# Patient Record
Sex: Female | Born: 1955 | Race: White | Hispanic: No | Marital: Married | Smoking: Never smoker
Health system: Southern US, Community
[De-identification: ages and names within clinical notes are randomized; demographics above are authoritative.]

## PROBLEM LIST (undated history)

## (undated) DIAGNOSIS — K219 Gastro-esophageal reflux disease without esophagitis: Secondary | ICD-10-CM

## (undated) DIAGNOSIS — M545 Low back pain, unspecified: Secondary | ICD-10-CM

## (undated) DIAGNOSIS — G629 Polyneuropathy, unspecified: Secondary | ICD-10-CM

## (undated) DIAGNOSIS — J45909 Unspecified asthma, uncomplicated: Secondary | ICD-10-CM

## (undated) DIAGNOSIS — E785 Hyperlipidemia, unspecified: Secondary | ICD-10-CM

## (undated) DIAGNOSIS — M1990 Unspecified osteoarthritis, unspecified site: Secondary | ICD-10-CM

## (undated) DIAGNOSIS — E119 Type 2 diabetes mellitus without complications: Secondary | ICD-10-CM

## (undated) DIAGNOSIS — T7840XA Allergy, unspecified, initial encounter: Secondary | ICD-10-CM

## (undated) DIAGNOSIS — I10 Essential (primary) hypertension: Secondary | ICD-10-CM

## (undated) DIAGNOSIS — I5189 Other ill-defined heart diseases: Secondary | ICD-10-CM

## (undated) DIAGNOSIS — G4700 Insomnia, unspecified: Secondary | ICD-10-CM

## (undated) DIAGNOSIS — R7303 Prediabetes: Secondary | ICD-10-CM

## (undated) HISTORY — DX: Essential (primary) hypertension: I10

## (undated) HISTORY — DX: Allergy, unspecified, initial encounter: T78.40XA

## (undated) HISTORY — PX: PLANTAR FASCIA RELEASE: SHX2239

## (undated) HISTORY — DX: Hyperlipidemia, unspecified: E78.5

## (undated) HISTORY — DX: Unspecified osteoarthritis, unspecified site: M19.90

## (undated) HISTORY — PX: CARPAL TUNNEL RELEASE: SHX101

## (undated) HISTORY — PX: CHOLECYSTECTOMY: SHX55

## (undated) HISTORY — PX: TUBAL LIGATION: SHX77

---

## 1986-10-08 HISTORY — PX: RHINOPLASTY: SUR1284

## 1988-10-08 HISTORY — PX: TEMPOROMANDIBULAR JOINT SURGERY: SHX35

## 1996-10-08 HISTORY — PX: ABDOMINAL HYSTERECTOMY: SHX81

## 1996-10-08 HISTORY — PX: TOTAL ABDOMINAL HYSTERECTOMY: SHX209

## 2004-03-26 ENCOUNTER — Other Ambulatory Visit: Payer: Self-pay

## 2005-02-01 ENCOUNTER — Ambulatory Visit: Payer: Self-pay

## 2006-03-12 ENCOUNTER — Ambulatory Visit: Payer: Self-pay

## 2007-03-28 ENCOUNTER — Ambulatory Visit: Payer: Self-pay | Admitting: Internal Medicine

## 2008-08-24 ENCOUNTER — Ambulatory Visit: Payer: Self-pay | Admitting: Internal Medicine

## 2009-08-30 ENCOUNTER — Ambulatory Visit: Payer: Self-pay | Admitting: Family Medicine

## 2009-10-08 HISTORY — PX: SHOULDER SURGERY: SHX246

## 2009-10-14 ENCOUNTER — Ambulatory Visit: Payer: Self-pay | Admitting: Family Medicine

## 2010-02-05 ENCOUNTER — Ambulatory Visit: Payer: Self-pay | Admitting: Unknown Physician Specialty

## 2010-02-16 ENCOUNTER — Ambulatory Visit: Payer: Self-pay | Admitting: Unknown Physician Specialty

## 2010-09-06 ENCOUNTER — Ambulatory Visit: Payer: Self-pay | Admitting: Family Medicine

## 2011-09-26 ENCOUNTER — Ambulatory Visit: Payer: Self-pay | Admitting: Family Medicine

## 2012-10-07 ENCOUNTER — Ambulatory Visit: Payer: Self-pay | Admitting: Family Medicine

## 2013-07-14 ENCOUNTER — Ambulatory Visit: Payer: Self-pay | Admitting: Family Medicine

## 2013-10-20 ENCOUNTER — Ambulatory Visit: Payer: Self-pay | Admitting: Family Medicine

## 2014-08-13 DIAGNOSIS — Z86010 Personal history of colonic polyps: Secondary | ICD-10-CM | POA: Insufficient documentation

## 2015-02-16 ENCOUNTER — Other Ambulatory Visit: Payer: Self-pay

## 2015-03-30 DIAGNOSIS — G4700 Insomnia, unspecified: Secondary | ICD-10-CM | POA: Insufficient documentation

## 2015-03-30 DIAGNOSIS — R7303 Prediabetes: Secondary | ICD-10-CM

## 2015-03-30 DIAGNOSIS — J45909 Unspecified asthma, uncomplicated: Secondary | ICD-10-CM | POA: Insufficient documentation

## 2015-03-30 DIAGNOSIS — D126 Benign neoplasm of colon, unspecified: Secondary | ICD-10-CM | POA: Insufficient documentation

## 2015-03-30 DIAGNOSIS — R42 Dizziness and giddiness: Secondary | ICD-10-CM | POA: Insufficient documentation

## 2015-03-30 DIAGNOSIS — E559 Vitamin D deficiency, unspecified: Secondary | ICD-10-CM | POA: Insufficient documentation

## 2015-03-30 DIAGNOSIS — E7800 Pure hypercholesterolemia, unspecified: Secondary | ICD-10-CM | POA: Insufficient documentation

## 2015-03-30 DIAGNOSIS — O99810 Abnormal glucose complicating pregnancy: Secondary | ICD-10-CM | POA: Insufficient documentation

## 2015-03-30 DIAGNOSIS — M26629 Arthralgia of temporomandibular joint, unspecified side: Secondary | ICD-10-CM | POA: Insufficient documentation

## 2015-03-30 HISTORY — DX: Prediabetes: R73.03

## 2015-04-01 ENCOUNTER — Encounter: Payer: Self-pay | Admitting: Physician Assistant

## 2015-04-01 ENCOUNTER — Ambulatory Visit (INDEPENDENT_AMBULATORY_CARE_PROVIDER_SITE_OTHER): Payer: 59 | Admitting: Physician Assistant

## 2015-04-01 VITALS — BP 122/68 | HR 68 | Temp 98.6°F | Resp 16 | Ht 65.0 in | Wt 174.0 lb

## 2015-04-01 DIAGNOSIS — Z0000 Encounter for general adult medical examination without abnormal findings: Secondary | ICD-10-CM

## 2015-04-01 DIAGNOSIS — Z8632 Personal history of gestational diabetes: Secondary | ICD-10-CM | POA: Insufficient documentation

## 2015-04-01 NOTE — Progress Notes (Deleted)
Subjective:     Patient ID: Debra [name], female   DOB: 09-29-1956, 59 y.o.   MRN: [id number]  HPI   Review of Systems     Objective:   Physical Exam     Assessment:     ***    Plan:     ***

## 2015-04-01 NOTE — Progress Notes (Signed)
Patient ID: Debra [name], female   DOB: Sep 30, 1956, 59 y.o.   MRN: [id number]       Patient: Debra [name], Female    DOB: [birth year], 59 y.o.   MRN: [id number] Visit Date: 04/01/2015  Today's Provider: Mar Daring, PA-C   Chief Complaint  Patient presents with  . Annual Exam   Subjective:    Annual physical exam Debra [name] is a 59 y.o. female who presents today for health maintenance and complete physical. She feels well. She reports exercising 4 days per week. She reports she is sleeping well.  She takes melatonin nightly.  -----------------------------------------------------------------   Review of Systems  Constitutional: Negative.   HENT: Negative.   Eyes: Negative.   Respiratory: Negative.   Cardiovascular: Negative.   Gastrointestinal: Negative.   Endocrine: Negative.   Musculoskeletal: Negative.   Skin: Negative.   Allergic/Immunologic: Positive for environmental allergies.  Neurological: Negative.   Hematological: Negative.   Psychiatric/Behavioral: Negative.     Social History She  reports that she has quit smoking. She does not have any smokeless tobacco history on file. She reports that she drinks alcohol. She reports that she does not use illicit drugs.  Patient Active Problem List   Diagnosis Date Noted  . Benign neoplasm of colon 03/30/2015  . Dizziness 03/30/2015  . Abnormal maternal glucose tolerance, complicating pregnancy 99/35/7017  . Hypercholesteremia 03/30/2015  . Cannot sleep 03/30/2015  . Borderline diabetes 03/30/2015  . RAD (reactive airway disease) 03/30/2015  . Arthralgia of temporomandibular joint 03/30/2015  . Avitaminosis D 03/30/2015    Past Surgical History  Procedure Laterality Date  . Shoulder surgery Right 2011  . Abdominal hysterectomy  1998  . Rhinoplasty  1988    Family History Her family history includes Alcohol abuse in her mother; Breast cancer in her mother; Hyperlipidemia in her brother, daughter,  and sister. She was adopted.    Previous Medications   CHOLECALCIFEROL (VITAMIN D) 2000 UNITS CAPS    Take 1 capsule by mouth daily.   CINNAMON BARK POWD    daily.   FLAXSEED, LINSEED, (FLAX SEEDS) POWD    Take by mouth daily.   FLUTICASONE (FLONASE) 50 MCG/ACT NASAL SPRAY    Place 2 sprays into the nose daily.   LORATADINE (CLARITIN) 10 MG TABLET    Take 1 tablet by mouth daily.   NIACIN (NIASPAN) 1000 MG CR TABLET    Take 2 tablets by mouth at bedtime. TAKE ASPIRIN AND EAT APPLE BEFORE TAKING MED TO HELP WITH FLUSHING   OMEGA-3 FATTY ACIDS (FISH OIL MAXIMUM STRENGTH) 1200 MG CAPS    Take 2 capsules by mouth 2 (two) times daily.   PSYLLIUM (REGULOID) 0.52 G CAPSULE    Take 1 capsule by mouth daily.    Patient Care Team: Mar Daring, PA-C as PCP - General (Physician Assistant)     Objective:   Vitals: BP 122/68 mmHg  Pulse 68  Temp(Src) 98.6 F (37 C)  Resp 16  Ht 5\' 5"  (1.651 m)  Wt 174 lb (78.926 kg)  BMI 28.96 kg/m2   Physical Exam  Constitutional: She is oriented to person, place, and time. She appears well-developed and well-nourished. No distress.  HENT:  Head: Normocephalic and atraumatic.  Right Ear: Hearing, tympanic membrane, external ear and ear canal normal.  Left Ear: Hearing, tympanic membrane, external ear and ear canal normal.  Nose: Nose normal.  Mouth/Throat: Uvula is midline, oropharynx is clear and moist and mucous membranes are  normal. No oropharyngeal exudate.  Eyes: Conjunctivae and EOM are normal. Pupils are equal, round, and reactive to light. Right eye exhibits no discharge. Left eye exhibits no discharge. No scleral icterus.  Neck: Normal range of motion. Neck supple. No JVD present. No tracheal deviation present. No thyromegaly present.  Cardiovascular: Normal rate, regular rhythm, normal heart sounds and intact distal pulses.  Exam reveals no gallop and no friction rub.   No murmur heard. Pulmonary/Chest: Effort normal and breath sounds  normal. No respiratory distress. She has no wheezes. She has no rales. She exhibits no tenderness. Right breast exhibits no inverted nipple, no mass, no nipple discharge, no skin change and no tenderness. Left breast exhibits no inverted nipple, no mass, no nipple discharge, no skin change and no tenderness. Breasts are symmetrical.  Abdominal: Soft. Bowel sounds are normal. She exhibits no distension and no mass. There is no tenderness. There is no rebound and no guarding.  Genitourinary: Rectum normal and vagina normal. No breast swelling, tenderness, discharge or bleeding. Pelvic exam was performed with patient supine. There is no rash, tenderness or lesion on the right labia. There is no rash, tenderness or lesion on the left labia. Cervix exhibits no motion tenderness, no discharge and no friability. Right adnexum displays no mass and no tenderness. Left adnexum displays no mass and no tenderness. No erythema or tenderness in the vagina. No vaginal discharge found.  S/P hysterectomy  Musculoskeletal: Normal range of motion. She exhibits no edema or tenderness.  Lymphadenopathy:    She has no cervical adenopathy.  Neurological: She is alert and oriented to person, place, and time. She has normal reflexes. No cranial nerve deficit. Coordination normal.  Skin: Skin is warm and dry. No rash noted. She is not diaphoretic. No erythema.  Psychiatric: She has a normal mood and affect. Her behavior is normal. Judgment and thought content normal.  Vitals reviewed.    Depression Screen No flowsheet data found.    Assessment & Plan:     Routine Health Maintenance and Physical Exam  Exercise Activities and Dietary recommendations Goals    None      Immunization History  Administered Date(s) Administered  . Tdap 04/14/2012    There are no preventive care reminders to display for this patient.    Discussed health benefits of physical activity, and encouraged her to engage in regular  exercise appropriate for her age and condition.    1. Routine general medical examination at a health care facility Will check labs and f/u pending results. - Mammogram Digital Screening; Future - CBC with Differential - Comprehensive metabolic panel - Lipid panel - TSH  2. H/O gestational diabetes mellitus, not currently pregnant - Hemoglobin A1c  --------------------------------------------------------------------

## 2015-04-01 NOTE — Patient Instructions (Signed)
Health Maintenance Adopting a healthy lifestyle and getting preventive care can go a long way to promote health and wellness. Talk with your health care provider about what schedule of regular examinations is right for you. This is a good chance for you to check in with your provider about disease prevention and staying healthy. In between checkups, there are plenty of things you can do on your own. Experts have done a lot of research about which lifestyle changes and preventive measures are most likely to keep you healthy. Ask your health care provider for more information. WEIGHT AND DIET  Eat a healthy diet 1. Be sure to include plenty of vegetables, fruits, low-fat dairy products, and lean protein. 2. Do not eat a lot of foods high in solid fats, added sugars, or salt. 3. Get regular exercise. This is one of the most important things you can do for your health. 1. Most adults should exercise for at least 150 minutes each week. The exercise should increase your heart rate and make you sweat (moderate-intensity exercise). 2. Most adults should also do strengthening exercises at least twice a week. This is in addition to the moderate-intensity exercise.  Maintain a healthy weight 1. Body mass index (BMI) is a measurement that can be used to identify possible weight problems. It estimates body fat based on height and weight. Your health care provider can help determine your BMI and help you achieve or maintain a healthy weight. 2. For females 6 years of age and older:  1. A BMI below 18.5 is considered underweight. 2. A BMI of 18.5 to 24.9 is normal. 3. A BMI of 25 to 29.9 is considered overweight. 4. A BMI of 30 and above is considered obese.  Watch levels of cholesterol and blood lipids 1. You should start having your blood tested for lipids and cholesterol at 57 years of age, then have this test every 5 years. 2. You may need to have your cholesterol levels checked more often if: 1. Your  lipid or cholesterol levels are high. 2. You are older than 57 years of age. 3. You are at high risk for heart disease.  CANCER SCREENING   Lung Cancer 1. Lung cancer screening is recommended for adults 7-87 years old who are at high risk for lung cancer because of a history of smoking. 2. A yearly low-dose CT scan of the lungs is recommended for people who: 1. Currently smoke. 2. Have quit within the past 15 years. 3. Have at least a 30-pack-year history of smoking. A pack year is smoking an average of one pack of cigarettes a day for 1 year. 3. Yearly screening should continue until it has been 15 years since you quit. 4. Yearly screening should stop if you develop a health problem that would prevent you from having lung cancer treatment.  Breast Cancer  Practice breast self-awareness. This means understanding how your breasts normally appear and feel.  It also means doing regular breast self-exams. Let your health care provider know about any changes, no matter how small.  If you are in your 20s or 30s, you should have a clinical breast exam (CBE) by a health care provider every 1-3 years as part of a regular health exam.  If you are 30 or older, have a CBE every year. Also consider having a breast X-Madlock (mammogram) every year.  If you have a family history of breast cancer, talk to your health care provider about genetic screening.  If you are  at high risk for breast cancer, talk to your health care provider about having an MRI and a mammogram every year.  Breast cancer gene (BRCA) assessment is recommended for women who have family members with BRCA-related cancers. BRCA-related cancers include:  Breast.  Ovarian.  Tubal.  Peritoneal cancers.  Results of the assessment will determine the need for genetic counseling and BRCA1 and BRCA2 testing. Cervical Cancer Routine pelvic examinations to screen for cervical cancer are no longer recommended for nonpregnant women who  are considered low risk for cancer of the pelvic organs (ovaries, uterus, and vagina) and who do not have symptoms. A pelvic examination may be necessary if you have symptoms including those associated with pelvic infections. Ask your health care provider if a screening pelvic exam is right for you.   The Pap test is the screening test for cervical cancer for women who are considered at risk.  If you had a hysterectomy for a problem that was not cancer or a condition that could lead to cancer, then you no longer need Pap tests.  If you are older than 65 years, and you have had normal Pap tests for the past 10 years, you no longer need to have Pap tests.  If you have had past treatment for cervical cancer or a condition that could lead to cancer, you need Pap tests and screening for cancer for at least 20 years after your treatment.  If you no longer get a Pap test, assess your risk factors if they change (such as having a new sexual partner). This can affect whether you should start being screened again.  Some women have medical problems that increase their chance of getting cervical cancer. If this is the case for you, your health care provider may recommend more frequent screening and Pap tests.  The human papillomavirus (HPV) test is another test that may be used for cervical cancer screening. The HPV test looks for the virus that can cause cell changes in the cervix. The cells collected during the Pap test can be tested for HPV.  The HPV test can be used to screen women 2 years of age and older. Getting tested for HPV can extend the interval between normal Pap tests from three to five years.  An HPV test also should be used to screen women of any age who have unclear Pap test results.  After 57 years of age, women should have HPV testing as often as Pap tests.  Colorectal Cancer  This type of cancer can be detected and often prevented.  Routine colorectal cancer screening usually  begins at 57 years of age and continues through 57 years of age.  Your health care provider may recommend screening at an earlier age if you have risk factors for colon cancer.  Your health care provider may also recommend using home test kits to check for hidden blood in the stool.  A small camera at the end of a tube can be used to examine your colon directly (sigmoidoscopy or colonoscopy). This is done to check for the earliest forms of colorectal cancer.  Routine screening usually begins at age 57.  Direct examination of the colon should be repeated every 5-10 years through 57 years of age. However, you may need to be screened more often if early forms of precancerous polyps or small growths are found. Skin Cancer  Check your skin from head to toe regularly.  Tell your health care provider about any new moles or changes in  moles, especially if there is a change in a mole's shape or color.  Also tell your health care provider if you have a mole that is larger than the size of a pencil eraser.  Always use sunscreen. Apply sunscreen liberally and repeatedly throughout the day.  Protect yourself by wearing long sleeves, pants, a wide-brimmed hat, and sunglasses whenever you are outside. HEART DISEASE, DIABETES, AND HIGH BLOOD PRESSURE   Have your blood pressure checked at least every 1-2 years. High blood pressure causes heart disease and increases the risk of stroke.  If you are between 32 years and 30 years old, ask your health care provider if you should take aspirin to prevent strokes.  Have regular diabetes screenings. This involves taking a blood sample to check your fasting blood sugar level.  If you are at a normal weight and have a low risk for diabetes, have this test once every three years after 57 years of age.  If you are overweight and have a high risk for diabetes, consider being tested at a younger age or more often. PREVENTING INFECTION  Hepatitis B  If you have a  higher risk for hepatitis B, you should be screened for this virus. You are considered at high risk for hepatitis B if:  You were born in a country where hepatitis B is common. Ask your health care provider which countries are considered high risk.  Your parents were born in a high-risk country, and you have not been immunized against hepatitis B (hepatitis B vaccine).  You have HIV or AIDS.  You use needles to inject street drugs.  You live with someone who has hepatitis B.  You have had sex with someone who has hepatitis B.  You get hemodialysis treatment.  You take certain medicines for conditions, including cancer, organ transplantation, and autoimmune conditions. Hepatitis C  Blood testing is recommended for:  Everyone born from [birth year] through 1963.  Anyone with known risk factors for hepatitis C. Sexually transmitted infections (STIs)  You should be screened for sexually transmitted infections (STIs) including gonorrhea and chlamydia if:  You are sexually active and are younger than 57 years of age.  You are older than 57 years of age and your health care provider tells you that you are at risk for this type of infection.  Your sexual activity has changed since you were last screened and you are at an increased risk for chlamydia or gonorrhea. Ask your health care provider if you are at risk.  If you do not have HIV, but are at risk, it may be recommended that you take a prescription medicine daily to prevent HIV infection. This is called pre-exposure prophylaxis (PrEP). You are considered at risk if:  You are sexually active and do not regularly use condoms or know the HIV status of your partner(s).  You take drugs by injection.  You are sexually active with a partner who has HIV. Talk with your health care provider about whether you are at high risk of being infected with HIV. If you choose to begin PrEP, you should first be tested for HIV. You should then be tested  every 3 months for as long as you are taking PrEP.  PREGNANCY   If you are premenopausal and you may become pregnant, ask your health care provider about preconception counseling.  If you may become pregnant, take 400 to 800 micrograms (mcg) of folic acid every day.  If you want to prevent pregnancy, talk to your  health care provider about birth control (contraception). OSTEOPOROSIS AND MENOPAUSE   Osteoporosis is a disease in which the bones lose minerals and strength with aging. This can result in serious bone fractures. Your risk for osteoporosis can be identified using a bone density scan.  If you are 34 years of age or older, or if you are at risk for osteoporosis and fractures, ask your health care provider if you should be screened.  Ask your health care provider whether you should take a calcium or vitamin D supplement to lower your risk for osteoporosis.  Menopause may have certain physical symptoms and risks.  Hormone replacement therapy may reduce some of these symptoms and risks. Talk to your health care provider about whether hormone replacement therapy is right for you.  HOME CARE INSTRUCTIONS   Schedule regular health, dental, and eye exams.  Stay current with your immunizations.   Do not use any tobacco products including cigarettes, chewing tobacco, or electronic cigarettes.  If you are pregnant, do not drink alcohol.  If you are breastfeeding, limit how much and how often you drink alcohol.  Limit alcohol intake to no more than 1 drink per day for nonpregnant women. One drink equals 12 ounces of beer, 5 ounces of wine, or 1 ounces of hard liquor.  Do not use street drugs.  Do not share needles.  Ask your health care provider for help if you need support or information about quitting drugs.  Tell your health care provider if you often feel depressed.  Tell your health care provider if you have ever been abused or do not feel safe at home. Document  Released: 04/09/2011 Document Revised: 02/08/2014 Document Reviewed: 08/26/2013 [facility] [facility] Patient Information 2015 [place], [place]. This information is not intended to replace advice given to you by your health care provider. Make sure you discuss any questions you have with your health care provider.     Why follow it? Research shows. . Those who follow the Mediterranean diet have a reduced risk of heart disease  . The diet is associated with a reduced incidence of Parkinson's and Alzheimer's diseases . People following the diet may have longer life expectancies and lower rates of chronic diseases  . The Dietary Guidelines for Americans recommends the Mediterranean diet as an eating plan to promote health and prevent disease  What Is the Mediterranean Diet?  . Healthy eating plan based on typical foods and recipes of Mediterranean-style cooking . The diet is primarily a plant based diet; these foods should make up a majority of meals   Starches - Plant based foods should make up a majority of meals - They are an important sources of vitamins, minerals, energy, antioxidants, and fiber - Choose whole grains, foods high in fiber and minimally processed items  - Typical grain sources include wheat, oats, barley, corn, brown rice, bulgar, farro, millet, polenta, couscous  - Various types of beans include chickpeas, lentils, fava beans, black beans, white beans   Fruits  Veggies - Large quantities of antioxidant rich fruits & veggies; 6 or more servings  - Vegetables can be eaten raw or lightly drizzled with oil and cooked  - Vegetables common to the traditional Mediterranean Diet include: artichokes, arugula, beets, broccoli, brussel sprouts, cabbage, carrots, celery, collard greens, cucumbers, eggplant, kale, leeks, lemons, lettuce, mushrooms, okra, onions, peas, peppers, potatoes, pumpkin, radishes, rutabaga, shallots, spinach, sweet potatoes, turnips, zucchini - Fruits common to the Mediterranean  Diet include: apples, apricots, avocados, cherries, clementines, dates, figs, grapefruits,  grapes, melons, nectarines, oranges, peaches, pears, pomegranates, strawberries, tangerines  Fats - Replace butter and margarine with healthy oils, such as olive oil, canola oil, and tahini  - Limit nuts to no more than a handful a day  - Nuts include walnuts, almonds, pecans, pistachios, pine nuts  - Limit or avoid candied, honey roasted or heavily salted nuts - Olives are central to the Mediterranean diet - can be eaten whole or used in a variety of dishes   Meats Protein - Limiting red meat: no more than a few times a month - When eating red meat: choose lean cuts and keep the portion to the size of deck of cards - Eggs: approx. 0 to 4 times a week  - Fish and lean poultry: at least 2 a week  - Healthy protein sources include, chicken, [place], lean beef, lamb - Increase intake of seafood such as tuna, salmon, trout, mackerel, shrimp, scallops - Avoid or limit high fat processed meats such as sausage and bacon  Dairy - Include moderate amounts of low fat dairy products  - Focus on healthy dairy such as fat free yogurt, skim milk, low or reduced fat cheese - Limit dairy products higher in fat such as whole or 2% milk, cheese, ice cream  Alcohol - Moderate amounts of red wine is ok  - No more than 5 oz daily for women (all ages) and men older than age 74  - No more than 10 oz of wine daily for men younger than 44  Other - Limit sweets and other desserts  - Use herbs and spices instead of salt to flavor foods  - Herbs and spices common to the traditional Mediterranean Diet include: basil, bay leaves, chives, cloves, cumin, fennel, garlic, lavender, marjoram, mint, oregano, parsley, pepper, rosemary, sage, savory, sumac, tarragon, thyme   It's not just a diet, it's a lifestyle:  . The Mediterranean diet includes lifestyle factors typical of those in the region  . Foods, drinks and meals are best eaten  with others and savored . Daily physical activity is important for overall good health . This could be strenuous exercise like running and aerobics . This could also be more leisurely activities such as walking, housework, yard-work, or taking the stairs . Moderation is the key; a balanced and healthy diet accommodates most foods and drinks . Consider portion sizes and frequency of consumption of certain foods   Meal Ideas & Options:  . Breakfast:  o Whole wheat toast or whole wheat English muffins with peanut butter & hard boiled egg o Steel cut oats topped with apples & cinnamon and skim milk  o Fresh fruit: banana, strawberries, melon, berries, peaches  o Smoothies: strawberries, bananas, greek yogurt, peanut butter o Low fat greek yogurt with blueberries and granola  o Egg white omelet with spinach and mushrooms o Breakfast couscous: whole wheat couscous, apricots, skim milk, cranberries  . Sandwiches:  o Hummus and grilled vegetables (peppers, zucchini, squash) on whole wheat bread   o Grilled chicken on whole wheat pita with lettuce, tomatoes, cucumbers or tzatziki  o Tuna salad on whole wheat bread: tuna salad made with greek yogurt, olives, red peppers, capers, green onions o Garlic rosemary lamb pita: lamb sauted with garlic, rosemary, salt & pepper; add lettuce, cucumber, greek yogurt to pita - flavor with lemon juice and black pepper  . Seafood:  o Mediterranean grilled salmon, seasoned with garlic, basil, parsley, lemon juice and black pepper o Shrimp, lemon, and spinach  whole-grain pasta salad made with low fat greek yogurt  o Seared scallops with lemon orzo  o Seared tuna steaks seasoned salt, pepper, coriander topped with tomato mixture of olives, tomatoes, olive oil, minced garlic, parsley, green onions and cappers  . Meats:  o Herbed greek chicken salad with kalamata olives, cucumber, feta  o Red bell peppers stuffed with spinach, bulgur, lean ground beef (or lentils) &  topped with feta   o Kebabs: skewers of chicken, tomatoes, onions, zucchini, squash  o [place] burgers: made with red onions, mint, dill, lemon juice, feta cheese topped with roasted red peppers . Vegetarian o Cucumber salad: cucumbers, artichoke hearts, celery, red onion, feta cheese, tossed in olive oil & lemon juice  o Hummus and whole grain pita points with a greek salad (lettuce, tomato, feta, olives, cucumbers, red onion) o Lentil soup with celery, carrots made with vegetable broth, garlic, salt and pepper  o Tabouli salad: parsley, bulgur, mint, scallions, cucumbers, tomato, radishes, lemon juice, olive oil, salt and pepper.      American Heart Association (AHA) Exercise Recommendation  Being physically active is important to prevent heart disease and stroke, the nation's No. 1and No. 5killers. To improve overall cardiovascular health, we suggest at least 150 minutes per week of moderate exercise or 75 minutes per week of vigorous exercise (or a combination of moderate and vigorous activity). Thirty minutes a day, five times a week is an easy goal to remember. You will also experience benefits even if you divide your time into two or three segments of 10 to 15 minutes per day.  For people who would benefit from lowering their blood pressure or cholesterol, we recommend 40 minutes of aerobic exercise of moderate to vigorous intensity three to four times a week to lower the risk for heart attack and stroke.  Physical activity is anything that makes you move your body and burn calories.  This includes things like climbing stairs or playing sports. Aerobic exercises benefit your heart, and include walking, jogging, swimming or biking. Strength and stretching exercises are best for overall stamina and flexibility.  The simplest, positive change you can make to effectively improve your heart health is to start walking. It's enjoyable, free, easy, social and great exercise. A walking program is  flexible and boasts high success rates because people can stick with it. It's easy for walking to become a regular and satisfying part of life.   For Overall Cardiovascular Health:  At least 30 minutes of moderate-intensity aerobic activity at least 5 days per week for a total of 150  OR   At least 25 minutes of vigorous aerobic activity at least 3 days per week for a total of 75 minutes; or a combination of moderate- and vigorous-intensity aerobic activity  AND   Moderate- to high-intensity muscle-strengthening activity at least 2 days per week for additional health benefits.  For Lowering Blood Pressure and Cholesterol  An average 40 minutes of moderate- to vigorous-intensity aerobic activity 3 or 4 times per week  What if I can't make it to the time goal? Something is always better than nothing! And everyone has to start somewhere. Even if you've been sedentary for years, today is the day you can begin to make healthy changes in your life. If you don't think you'll make it for 30 or 40 minutes, set a reachable goal for today. You can work up toward your overall goal by increasing your time as you get stronger. Don't let all-or-nothing thinking  rob you of doing what you can every day.  Source:http://www.heart.org

## 2015-04-06 ENCOUNTER — Telehealth: Payer: Self-pay

## 2015-04-06 LAB — COMPREHENSIVE METABOLIC PANEL
ALK PHOS: 81 IU/L (ref 39–117)
ALT: 29 IU/L (ref 0–32)
AST: 22 IU/L (ref 0–40)
Albumin/Globulin Ratio: 1.6 (ref 1.1–2.5)
Albumin: 4.1 g/dL (ref 3.5–5.5)
BUN / CREAT RATIO: 22 (ref 9–23)
BUN: 15 mg/dL (ref 6–24)
Bilirubin Total: 0.2 mg/dL (ref 0.0–1.2)
CHLORIDE: 103 mmol/L (ref 97–108)
CO2: 26 mmol/L (ref 18–29)
CREATININE: 0.67 mg/dL (ref 0.57–1.00)
Calcium: 9.7 mg/dL (ref 8.7–10.2)
GFR calc Af Amer: 114 mL/min/{1.73_m2} (ref >59)
GFR calc non Af Amer: 99 mL/min/{1.73_m2} (ref >59)
Globulin, Total: 2.6 g/dL (ref 1.5–4.5)
Glucose: 99 mg/dL (ref 65–99)
Potassium: 4.8 mmol/L (ref 3.5–5.2)
Sodium: 142 mmol/L (ref 134–144)
Total Protein: 6.7 g/dL (ref 6.0–8.5)

## 2015-04-06 LAB — CBC WITH DIFFERENTIAL/PLATELET
BASOS ABS: 0 10*3/uL (ref 0.0–0.2)
Basos: 1 %
EOS (ABSOLUTE): 0.1 10*3/uL (ref 0.0–0.4)
EOS: 2 %
Hematocrit: 41.1 % (ref 34.0–46.6)
Hemoglobin: 14 g/dL (ref 11.1–15.9)
Immature Grans (Abs): 0 10*3/uL (ref 0.0–0.1)
Immature Granulocytes: 0 %
Lymphocytes Absolute: 2.6 10*3/uL (ref 0.7–3.1)
Lymphs: 46 %
MCH: 30.8 pg (ref 26.6–33.0)
MCHC: 34.1 g/dL (ref 31.5–35.7)
MCV: 90 fL (ref 79–97)
Monocytes Absolute: 0.4 10*3/uL (ref 0.1–0.9)
Monocytes: 7 %
NEUTROS ABS: 2.6 10*3/uL (ref 1.4–7.0)
NEUTROS PCT: 44 %
PLATELETS: 329 10*3/uL (ref 150–379)
RBC: 4.55 x10E6/uL (ref 3.77–5.28)
RDW: 13.3 % (ref 12.3–15.4)
WBC: 5.7 10*3/uL (ref 3.4–10.8)

## 2015-04-06 LAB — TSH: TSH: 2.19 u[IU]/mL (ref 0.450–4.500)

## 2015-04-06 LAB — LIPID PANEL
CHOL/HDL RATIO: 4.6 ratio — AB (ref 0.0–4.4)
Cholesterol, Total: 239 mg/dL — ABNORMAL HIGH (ref 100–199)
HDL: 52 mg/dL (ref >39)
LDL CALC: 136 mg/dL — AB (ref 0–99)
TRIGLYCERIDES: 253 mg/dL — AB (ref 0–149)
VLDL Cholesterol Cal: 51 mg/dL — ABNORMAL HIGH (ref 5–40)

## 2015-04-06 LAB — HEMOGLOBIN A1C
Est. average glucose Bld gHb Est-mCnc: 117 mg/dL
Hgb A1c MFr Bld: 5.7 % — ABNORMAL HIGH (ref 4.8–5.6)

## 2015-04-06 NOTE — Telephone Encounter (Signed)
Pt returned Nikki's call. Thanks TNP

## 2015-04-06 NOTE — Telephone Encounter (Signed)
-----   Message from Mar Daring, [place] sent at 04/06/2015  8:22 AM EDT ----- Labs are stable and WNL with exception of cholesterol and HgBA1c which were elevated.  These labs can be lowered with diet and exercise.  Try to limit high fat, high cholesterol, high carbohydrate foods in diet.  Try to add exercise for at least 30-40 minutes 3-4 days per week as well.  We will recheck these labs in 6 months.

## 2015-04-06 NOTE — Telephone Encounter (Signed)
LMTCB

## 2015-04-07 NOTE — Telephone Encounter (Signed)
Patient advised as directed below. Patient verbalized understanding and agrees to treatment plan.

## 2015-04-13 ENCOUNTER — Ambulatory Visit
Admission: RE | Admit: 2015-04-13 | Discharge: 2015-04-13 | Disposition: A | Discharge status: Home / Self Care | Payer: 59 | Source: Ambulatory Visit | Attending: Physician Assistant | Admitting: Physician Assistant

## 2015-04-13 DIAGNOSIS — Z0000 Encounter for general adult medical examination without abnormal findings: Secondary | ICD-10-CM | POA: Diagnosis present

## 2015-04-13 DIAGNOSIS — Z1231 Encounter for screening mammogram for malignant neoplasm of breast: Secondary | ICD-10-CM | POA: Diagnosis present

## 2015-04-28 ENCOUNTER — Ambulatory Visit: Payer: Self-pay | Admitting: Family Medicine

## 2015-06-08 ENCOUNTER — Other Ambulatory Visit: Payer: Self-pay

## 2015-06-08 DIAGNOSIS — E7800 Pure hypercholesterolemia, unspecified: Secondary | ICD-10-CM

## 2015-06-08 MED ORDER — NIACIN ER (ANTIHYPERLIPIDEMIC) 1000 MG PO TBCR: 2000.0000 mg | EXTENDED_RELEASE_TABLET | Freq: Every day | ORAL | Status: DC

## 2015-06-23 ENCOUNTER — Ambulatory Visit: Payer: 59 | Attending: Orthopedic Surgery | Admitting: Occupational Therapy

## 2015-06-23 DIAGNOSIS — G5601 Carpal tunnel syndrome, right upper limb: Secondary | ICD-10-CM | POA: Diagnosis not present

## 2015-06-23 NOTE — Therapy (Signed)
[facility] [address], [place], [address] Phone: [phone]   Fax:  [phone]  Occupational Therapy Treatment  Patient Details  Name: Debra [name] MRN: [id number] Date of Birth: 04/10/1956 Referring Provider:  Hessie Knows, MD  Encounter Date: 06/23/2015      OT End of Session - 06/23/15 2109    Visit Number 1   Number of Visits 4   Date for OT Re-Evaluation 07/21/15   OT Start Time 1450   OT Stop Time 1545   OT Time Calculation (min) 55 min   Activity Tolerance Patient tolerated treatment well   Behavior During Therapy [facility] for tasks assessed/performed      No past medical history on file.  Past Surgical History  Procedure Laterality Date  . Shoulder surgery Right 2011  . Abdominal hysterectomy  1998  . Rhinoplasty  1988    There were no vitals filed for this visit.  Visit Diagnosis:  Carpal tunnel syndrome of right wrist - Plan: Ot plan of care cert/re-cert      Subjective Assessment - 06/23/15 2054    Subjective  my symptoms is probably for about year - but the last few months it got worse   Patient Stated Goals To get the numbness better and pain in my R hand   Currently in Pain? Yes   Pain Score 2    Pain Location Wrist   Pain Orientation Right   Pain Descriptors / Indicators Aching;Numbness   Pain Onset More than a month ago            [facility] OT Assessment - 06/23/15 0001    Assessment   Diagnosis R CTS   Onset Date 03/09/15   Assessment Pt present with numbness at night time more than end of day - got worse the last few months - she moved a few months ago and did a lot of sanding /painting - 5 rooms after we discuss any changes in her activities the last few months    Home  Environment   Lives With Spouse   Prior Function   Level of Independence Independent   Vocation Full time employment   Leisure She works as Barista rep/orders Materials engineer - on computer  and phone a lot - she do like to play pickleball and badminton - and moved few months ago and did  lot of sanding and painting    AROM   Right Wrist Extension 54 Degrees   Right Wrist Flexion 64 Degrees   Left Wrist Extension 65 Degrees   Left Wrist Flexion 75 Degrees   Strength   Right Hand Grip (lbs) 40   Right Hand Lateral Pinch 18 lbs   Right Hand 3 Point Pinch 12 lbs   Left Hand Grip (lbs) 52   Left Hand Lateral Pinch 19 lbs   Left Hand 3 Point Pinch 14 lbs   Right Hand AROM   R Thumb MCP 0-60 50 Degrees   R Thumb IP 0-80 60 Degrees   R Thumb Radial ABduction/ADduction 0-55 48   R Thumb Palmar ABduction/ADduction 0-45 45   R Index  MCP 0-90 70 Degrees   R Long  MCP 0-90 80 Degrees   R Ring  MCP 0-90 85 Degrees   R Little  MCP 0-90 90 Degrees   Left Hand AROM   L Thumb MCP 0-60 60 Degrees   L Thumb IP 0-80 65 Degrees   L  Index  MCP 0-90 80 Degrees                  OT Treatments/Exercises (OP) - 06/23/15 0001    RUE Contrast Bath   Time 11 minutes   Comments Contrast to decrease edema, pain and sensory issues prior to review of ROM - and ed pt to perfrom at home                OT Education - 06/23/15 2109    Education provided Yes   Education Details Home program   Person(s) Educated Patient   Methods Explanation;Demonstration;Tactile cues;Verbal cues;Handout   Comprehension Verbal cues required;Returned demonstration;Verbalized understanding          OT Short Term Goals - 06/23/15 2120    OT SHORT TERM GOAL #1   Title Pt show decrease numbness at night time and decrease pain on PRWHE by at least 10 points   Baseline PRWHE pain 26/50; numbness every night    Time 3   Period Weeks   Status New   OT SHORT TERM GOAL #2   Title Pt to be ind in HEP to increase ROM at diigtis and wrist    Baseline thumb AROM decrease at all joints, 2nd MC  decrease and wrist flexion/ext   Time 3   Period Weeks   Status New           OT Long Term Goals -  06/23/15 2121    OT LONG TERM GOAL #1   Title Pt to verbalize 3 modifications to use R hand in ADL's and IADlL's to decrease or prevent CTS   Baseline doing wrong activiites or movements that flare up CTS   Time 4   Period Weeks   Status New               Plan - 06/23/15 2110    Clinical Impression Statement Pt present with R CTS with reports being aware of it for about year - but last few months worse at night time and when using it a lot - pain at palmar wrist - after discussing with pt activities - she report moving few months ago and she did  lot of sanding and paintin ( railings/5 rooms) , and she trim some bushes with  a type of hedge saw - pt present with decrease ROM in R hand and wrist , decrease grip and prehension - limiting her functional use of R dominant hand   Pt will benefit from skilled therapeutic intervention in order to improve on the following deficits (Retired) Decreased knowledge of use of DME;Decreased knowledge of precautions;Decreased range of motion;Decreased strength;Impaired flexibility;Impaired sensation;Impaired UE functional use;Pain   Rehab Potential Good   OT Frequency 1x / week   OT Duration 4 weeks   OT Treatment/Interventions Self-care/ADL training;Contrast Bath;Ultrasound;Therapeutic exercise;DME and/or AE instruction;Passive range of motion;Manual Therapy;Splinting;Patient/family education   Plan assess CTS - HEP    OT Home Exercise Plan see pt instruction   Consulted and Agree with Plan of Care Patient        Problem List Patient Active Problem List   Diagnosis Date Noted  . H/O gestational diabetes mellitus, not currently pregnant 04/01/2015  . Benign neoplasm of colon 03/30/2015  . Dizziness 03/30/2015  . Abnormal maternal glucose tolerance, complicating pregnancy 37/85/8850  . Hypercholesteremia 03/30/2015  . Cannot sleep 03/30/2015  . Borderline diabetes 03/30/2015  . RAD (reactive airway disease) 03/30/2015  . Arthralgia of  temporomandibular joint 03/30/2015  .  Avitaminosis D 03/30/2015    Rosalyn Gess OTR/L,CLT 06/23/2015, 9:29 PM  [facility] [address], [place], [address] Phone: [phone]   Fax:  [phone]

## 2015-06-23 NOTE — Patient Instructions (Signed)
Pt to do contrast 3 min heat, 1 min ice x 2 and heat 3 min  Tendon glides 8 reps  Med N glides - first 4 steps - 3-5 reps Thumb PA AROM   Modifications and tasks or movements to avoid

## 2015-06-30 ENCOUNTER — Encounter: Payer: 59 | Admitting: Occupational Therapy

## 2015-07-06 ENCOUNTER — Ambulatory Visit: Payer: 59 | Admitting: Occupational Therapy

## 2015-07-06 DIAGNOSIS — G5601 Carpal tunnel syndrome, right upper limb: Secondary | ICD-10-CM

## 2015-07-06 NOTE — Therapy (Signed)
[facility] [address], [place], [address] Phone: [phone]   Fax:  [phone]  Occupational Therapy Treatment  Patient Details  Name: Debra [name] MRN: [id number] Date of Birth: [birth year] Referring Zachari Alberta:  Hessie Knows, MD  Encounter Date: 07/06/2015      OT End of Session - 07/06/15 2012    Visit Number 2   Number of Visits 4   Date for OT Re-Evaluation 07/21/15   OT Start Time 1642   OT Stop Time 1720   OT Time Calculation (min) 38 min   Activity Tolerance Patient tolerated treatment well   Behavior During Therapy [facility] for tasks assessed/performed      No past medical history on file.  Past Surgical History  Procedure Laterality Date  . Shoulder surgery Right 2011  . Abdominal hysterectomy  1998  . Rhinoplasty  1988    There were no vitals filed for this visit.  Visit Diagnosis:  Carpal tunnel syndrome of right wrist      Subjective Assessment - 07/06/15 2008    Subjective  I did not had any numnbness the last week - do wear my splint the whole time- but not last night - did play pickle ball yesterday and had no trouble did wear strap around wrist - I do think my index finger moving a little better - my index hurtiing little by the end of the day   Patient Stated Goals To get the numbness better and pain in my R hand   Currently in Pain? Yes   Pain Score 2    Pain Location Finger (Comment which one)   Pain Orientation Right   Pain Descriptors / Indicators Aching                              OT Education - 07/06/15 2012    Education provided Yes   Education Details see pt instruction    Person(s) Educated Patient   Methods Explanation;Demonstration;Tactile cues;Verbal cues   Comprehension Returned demonstration;Verbal cues required;Verbalized understanding          OT Short Term Goals - 06/23/15 2120    OT SHORT TERM GOAL #1   Title Pt show  decrease numbness at night time and decrease pain on PRWHE by at least 10 points   Baseline PRWHE pain 26/50; numbness every night    Time 3   Period Weeks   Status New   OT SHORT TERM GOAL #2   Title Pt to be ind in HEP to increase ROM at diigtis and wrist    Baseline thumb AROM decrease at all joints, 2nd MC  decrease and wrist flexion/ext   Time 3   Period Weeks   Status New           OT Long Term Goals - 06/23/15 2121    OT LONG TERM GOAL #1   Title Pt to verbalize 3 modifications to use R hand in ADL's and IADlL's to decrease or prevent CTS   Baseline doing wrong activiites or movements that flare up CTS   Time 4   Period Weeks   Status New      Pt arrive with wrist splint on  parafin bath done to R hand - followed my manual therapy -gentle traction and joint mobs to 2nd MC and PIP - no pain - soft tissue mobs to webspace prior to ROM  Gentle PROm for MC flexion up to about 80 degrees pain free, 85 with some pain  AROM and tendon glides reviewed - needed min v/c  Med N glide   Wrist AROM improved in extention same than L  Add isometric strength to prepare to wean out of wrist splint during daytime by next week   Reviewed some modifications - she did make her pen at work fatter - to not grasp so tight - and strap for wrist with pickle ball and play not as much with wrist motion            Plan - 07/06/15 2013    Clinical Impression Statement Pt report improvement in CTS but still wearing wrist splint 24/7 except with HEP and ADl's - for about 2 wks - pt to wear for another week and will wean if still symptoms free dayime wear next week - pt did show progress in 2nd digts flexion and wrist extention - cont to decrease symptoms while increase  ROM    Pt will benefit from skilled therapeutic intervention in order to improve on the following deficits (Retired) Decreased knowledge of use of DME;Decreased knowledge of precautions;Decreased range of motion;Decreased  strength;Impaired flexibility;Impaired sensation;Impaired UE functional use;Pain   Rehab Potential Good   OT Frequency 1x / week   OT Duration 4 weeks   OT Treatment/Interventions Self-care/ADL training;Contrast Bath;Ultrasound;Therapeutic exercise;DME and/or AE instruction;Passive range of motion;Manual Therapy;Splinting;Patient/family education   Plan assess CT symptoms - HEP    OT Home Exercise Plan see pt instruction   Consulted and Agree with Plan of Care Patient        Problem List Patient Active Problem List   Diagnosis Date Noted  . H/O gestational diabetes mellitus, not currently pregnant 04/01/2015  . Benign neoplasm of colon 03/30/2015  . Dizziness 03/30/2015  . Abnormal maternal glucose tolerance, complicating pregnancy 97/11/6376  . Hypercholesteremia 03/30/2015  . Cannot sleep 03/30/2015  . Borderline diabetes 03/30/2015  . RAD (reactive airway disease) 03/30/2015  . Arthralgia of temporomandibular joint 03/30/2015  . Avitaminosis D 03/30/2015    DuPreez, Maureen OTR/l, CLT 07/06/2015, 8:17 PM  [facility] [address] [place], [address] Phone: [phone]   Fax:  [phone]

## 2015-07-06 NOTE — Patient Instructions (Addendum)
Gentle PROm for MC flexion up  To slight pull prior to t   tendon glides   Med N glide  Cont     Add isometric strength to prepare to wean out of wrist splint during daytime by next week   Reviewed some modifications - she did make her pen at work fatter - to not grasp so tight - and strap for wrist with pickle ball and play not as much with wrist motion

## 2015-07-14 ENCOUNTER — Encounter: Payer: 59 | Admitting: Occupational Therapy

## 2015-09-12 ENCOUNTER — Ambulatory Visit (INDEPENDENT_AMBULATORY_CARE_PROVIDER_SITE_OTHER): Payer: Managed Care, Other (non HMO) | Admitting: Physician Assistant

## 2015-09-12 ENCOUNTER — Encounter: Payer: Self-pay | Admitting: Physician Assistant

## 2015-09-12 VITALS — BP 110/60 | HR 76 | Temp 98.1°F | Resp 16 | Wt 180.4 lb

## 2015-09-12 DIAGNOSIS — R51 Headache: Secondary | ICD-10-CM | POA: Diagnosis not present

## 2015-09-12 DIAGNOSIS — R6884 Jaw pain: Secondary | ICD-10-CM

## 2015-09-12 DIAGNOSIS — H538 Other visual disturbances: Secondary | ICD-10-CM

## 2015-09-12 DIAGNOSIS — R519 Headache, unspecified: Secondary | ICD-10-CM

## 2015-09-12 DIAGNOSIS — R631 Polydipsia: Secondary | ICD-10-CM | POA: Diagnosis not present

## 2015-09-12 DIAGNOSIS — G4700 Insomnia, unspecified: Secondary | ICD-10-CM | POA: Diagnosis not present

## 2015-09-12 DIAGNOSIS — R7303 Prediabetes: Secondary | ICD-10-CM | POA: Diagnosis not present

## 2015-09-12 LAB — POCT GLYCOSYLATED HEMOGLOBIN (HGB A1C)
Est. average glucose Bld gHb Est-mCnc: 117
HEMOGLOBIN A1C: 5.7

## 2015-09-12 NOTE — Progress Notes (Signed)
Patient: Debra [name] Female    DOB: [birth year]   59 y.o.   MRN: [id number] Visit Date: 09/12/2015  Today's Provider: Mar Daring, PA-C   Chief Complaint  Patient presents with  . Would like to discuss risk factors for Diabetes   Subjective:    HPI Debra [name] is a 59 year here to discuss the risk factors for diabetes. Patient states feeling very thirsty, tired, fatigue. She states over the last 3 weeks she has had increased fatigue and has not been sleeping very well. Then approximately 2 weeks ago she started noticing her bottom lip quivering almost with like a pulsation sensation. Then on Friday, 09/09/2015 she did notice having a sharp shooting pain from her left ear down to the left side of her mouth. 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 Also on Friday she started noticing her vision was becoming blurry. Friday night she developed a headache. She took ibuprofen and laid down and went to sleep. She was able to sleep that night but then woke up Saturday with continued headache and blurred vision. She states that she stayed in bed Saturday due to the headache. She did not have a headache Sunday nor today. She has not had any recurrence of the shooting pain that occurred on the left side of the jaw. She does continue to have blurred vision stating today is the worst day. She does normally go for annual eye exams states that her eye exam for this year is due.   She is adopted so she does not know much of her past family medical history. She states the one thing she does note was that her birth mother did develop macular degeneration. She denies any weakness, numbness or paralysis on one side of the body or the other. She denies any tinnitus or ear pain. She denies any dizziness or loss of balance. She denies any double vision. She denies fevers, chills, nausea or vomiting.  She does however mention that she has been under a lot of stress recently. She states she did  not feel like she was that stressed but that just this year her husband has been diagnosed with prostate cancer and melanoma. He also had to have a pacemaker placed this year and just recently was diagnosed with bulging disc in his lumbar spine and is having trouble getting around. She states she also has noticed that she does not sleep well because she feels that she is constantly waking up to make sure that he is breathing. She states he does have severe sleep apnea but he is noncompliant with his CPAP use. She feels this is one of her main reasons why she does not sleep well.     Allergies  Allergen Reactions  . Other Itching    Seasonal   . Pravastatin Sodium     Other reaction(s): Joint Pains   Previous Medications   ASCORBIC ACID (VITAMIN C) 1000 MG TABLET    Take by mouth.   CHOLECALCIFEROL (VITAMIN D) 2000 UNITS CAPS    Take 1 capsule by mouth daily.   CINNAMON BARK POWD    daily.   FISH OIL-KRILL OIL (KRILL OIL PLUS PO)    Take by mouth daily.   FLAXSEED, LINSEED, (FLAX SEEDS) POWD    Take by mouth daily.   FLUTICASONE (FLONASE) 50 MCG/ACT NASAL SPRAY    Place 2 sprays into the nose daily.   GLUCOSAMINE-CHONDROIT-VIT C-MN (  GLUCOSAMINE 1500 COMPLEX) CAPS    Take by mouth.   LORATADINE (CLARITIN) 10 MG TABLET    Take 1 tablet by mouth daily.   NIACIN (NIASPAN) 1000 MG CR TABLET    Take 2 tablets (2,000 mg total) by mouth at bedtime. TAKE ASPIRIN AND EAT APPLE BEFORE TAKING MED TO HELP WITH FLUSHING   OMEGA-3 FATTY ACIDS (FISH OIL MAXIMUM STRENGTH) 1200 MG CAPS    Take 2 capsules by mouth 2 (two) times daily.   PSYLLIUM (REGULOID) 0.52 G CAPSULE    Take 1 capsule by mouth daily.    Review of Systems  Constitutional: Positive for fatigue. Negative for fever and chills.  HENT: Negative for congestion, ear pain, hearing loss, rhinorrhea, sinus pressure, sneezing, sore throat, tinnitus, trouble swallowing and voice change.   Eyes: Positive for visual disturbance (blurred vision).  Negative for photophobia, pain and discharge.  Respiratory: Negative.   Cardiovascular: Negative.   Gastrointestinal: Negative.   Endocrine: Negative.   Genitourinary: Negative.   Musculoskeletal: Negative.   Allergic/Immunologic: Negative.   Neurological: Positive for headaches.  Hematological: Negative.   Psychiatric/Behavioral: Negative.     Social History  Substance Use Topics  . Smoking status: Former Research scientist (life sciences)  . Smokeless tobacco: Not on file  . Alcohol Use: 0.0 oz/week    0 Standard drinks or equivalent per week     Comment: OCCASIONALLY   Objective:   BP 110/60 mmHg  Pulse 76  Temp(Src) 98.1 F (36.7 C) (Oral)  Resp 16  Wt 180 lb 6.4 oz (81.829 kg)  Physical Exam  Constitutional: She is oriented to person, place, and time. She appears well-developed and well-nourished. No distress.  HENT:  Head: Normocephalic and atraumatic.  Right Ear: Hearing, tympanic membrane, external ear and ear canal normal.  Left Ear: Hearing, tympanic membrane, external ear and ear canal normal.  Nose: Nose normal.  Mouth/Throat: Uvula is midline, oropharynx is clear and moist and mucous membranes are normal. No oropharyngeal exudate.  Eyes: Conjunctivae and EOM are normal. Pupils are equal, round, and reactive to light. Right eye exhibits no discharge. Left eye exhibits no discharge. No scleral icterus.  Neck: Normal range of motion. Neck supple. No JVD present. No tracheal deviation present. No thyromegaly present.  Cardiovascular: Normal rate, regular rhythm and normal heart sounds.  Exam reveals no gallop and no friction rub.   No murmur heard. Pulmonary/Chest: Effort normal and breath sounds normal. No respiratory distress. She has no wheezes. She has no rales.  Abdominal: Soft. Bowel sounds are normal. She exhibits no distension and no mass. There is no tenderness. There is no rebound and no guarding.  Musculoskeletal: Normal range of motion. She exhibits no edema or tenderness.    Strength is normal  Lymphadenopathy:    She has no cervical adenopathy.  Neurological: She is alert and oriented to person, place, and time. She has normal reflexes. She displays normal reflexes. No cranial nerve deficit or sensory deficit. She exhibits normal muscle tone. She displays a negative Romberg sign. Coordination normal.  Skin: She is not diaphoretic.  Psychiatric: She has a normal mood and affect. Her behavior is normal. Judgment and thought content normal.  Vitals reviewed.       Assessment & Plan:     1. Blurred vision, bilateral Hemoglobin A1c in the office today was 5.7. This is stable from what it was in June 2016. I do question if stress and lack of sleep is the cause of the majority of her symptoms.  I did also discuss this with Dr. Venia Minks and she agrees with doing labs to workup and external cause but also feels that a lot of her symptoms could correlate with stress and lack of sleep. I will check her labs to make sure there is no other possible cause. I will follow-up with her pending these lab results. In the meantime she is also going to increase her melatonin from 5 mg to 10 mg. We will see if this helps her sleep at all. We did discuss alternative sleep aids as well. She does have a good sleep hygiene regimen that she does nightly. I did advise her to call her ophthalmologist and schedule her annual eye exam. I advised that with her mother's history of macular degeneration it would be good for her to make sure to have her annual eye exams to be watching for this. She agrees and states she will do that he can discuss what is told her at that appointment when she returns here for her follow-up. We will also discuss ways for her to handle the stress that she is under with her husband's health issues when she returns. If her labs are stable and within normal limits I will follow-up with her in 4 weeks to see how she is doing. - CBC with Differential - Comprehensive Metabolic  Panel (CMET) - TSH - Lipid panel - Arginine vasopressin hormone - Sed Rate (ESR) - POCT HgB A1C  2. Acute nonintractable headache, unspecified headache type See above medical treatment plan. - CBC with Differential - Comprehensive Metabolic Panel (CMET) - TSH - Lipid panel - Arginine vasopressin hormone - Sed Rate (ESR) - POCT HgB A1C  3. Increased thirst See above medical treatment plan. - CBC with Differential - Comprehensive Metabolic Panel (CMET) - TSH - Lipid panel - Arginine vasopressin hormone - Sed Rate (ESR) - POCT HgB A1C  4. Cannot sleep See above medical treatment plan. - CBC with Differential - Comprehensive Metabolic Panel (CMET) - TSH - Lipid panel - Arginine vasopressin hormone - Sed Rate (ESR) - POCT HgB A1C  5. Jaw pain See above medical treatment plan. - CBC with Differential - Comprehensive Metabolic Panel (CMET) - TSH - Lipid panel - Arginine vasopressin hormone - Sed Rate (ESR) - POCT HgB A1C  6. Borderline diabetes Hemoglobin A1c stable at 5.7. Advised to continue trying to make lifestyle changes including limiting carbohydrates and simple sugars in her diet. We also discussed increasing physical activity trying to the exercise for 30-40 minutes 3-4 days per week. We discussed how these lifestyle changes would benefit the increased hemoglobin A1c as well as her stress levels. - POCT HgB A1C       Mar Daring, PA-C  [facility]

## 2015-09-12 NOTE — Patient Instructions (Signed)
Insomnia Insomnia is a sleep disorder that makes it difficult to fall asleep or to stay asleep. Insomnia can cause tiredness (fatigue), low energy, difficulty concentrating, mood swings, and poor performance at work or school.  There are three different ways to classify insomnia:  Difficulty falling asleep.  Difficulty staying asleep.  Waking up too early in the morning. Any type of insomnia can be long-term (chronic) or short-term (acute). Both are common. Short-term insomnia usually lasts for three months or less. Chronic insomnia occurs at least three times a week for longer than three months. CAUSES  Insomnia may be caused by another condition, situation, or substance, such as:  Anxiety.  Certain medicines.  Gastroesophageal reflux disease (GERD) or other gastrointestinal conditions.  Asthma or other breathing conditions.  Restless legs syndrome, sleep apnea, or other sleep disorders.  Chronic pain.  Menopause. This may include hot flashes.  Stroke.  Abuse of alcohol, tobacco, or illegal drugs.  Depression.  Caffeine.   Neurological disorders, such as Alzheimer disease.  An overactive thyroid (hyperthyroidism). The cause of insomnia may not be known. RISK FACTORS Risk factors for insomnia include:  Gender. Women are more commonly affected than men.  Age. Insomnia is more common as you get older.  Stress. This may involve your professional or personal life.  Income. Insomnia is more common in people with lower income.  Lack of exercise.   Irregular work schedule or night shifts.  Traveling between different time zones. SIGNS AND SYMPTOMS If you have insomnia, trouble falling asleep or trouble staying asleep is the main symptom. This may lead to other symptoms, such as:  Feeling fatigued.  Feeling nervous about going to sleep.  Not feeling rested in the morning.  Having trouble concentrating.  Feeling irritable, anxious, or depressed. TREATMENT   Treatment for insomnia depends on the cause. If your insomnia is caused by an underlying condition, treatment will focus on addressing the condition. Treatment may also include:   Medicines to help you sleep.  Counseling or therapy.  Lifestyle adjustments. HOME CARE INSTRUCTIONS   Take medicines only as directed by your health care provider.  Keep regular sleeping and waking hours. Avoid naps.  Keep a sleep diary to help you and your health care provider figure out what could be causing your insomnia. Include:   When you sleep.  When you wake up during the night.  How well you sleep.   How rested you feel the next day.  Any side effects of medicines you are taking.  What you eat and drink.   Make your bedroom a comfortable place where it is easy to fall asleep:  Put up shades or special blackout curtains to block light from outside.  Use a white noise machine to block noise.  Keep the temperature cool.   Exercise regularly as directed by your health care provider. Avoid exercising right before bedtime.  Use relaxation techniques to manage stress. Ask your health care provider to suggest some techniques that may work well for you. These may include:  Breathing exercises.  Routines to release muscle tension.  Visualizing peaceful scenes.  Cut back on alcohol, caffeinated beverages, and cigarettes, especially close to bedtime. These can disrupt your sleep.  Do not overeat or eat spicy foods right before bedtime. This can lead to digestive discomfort that can make it hard for you to sleep.  Limit screen use before bedtime. This includes:  Watching TV.  Using your smartphone, tablet, and computer.  Stick to a routine. This  can help you fall asleep faster. Try to do a quiet activity, brush your teeth, and go to bed at the same time each night.  Get out of bed if you are still awake after 15 minutes of trying to sleep. Keep the lights down, but try reading or  doing a quiet activity. When you feel sleepy, go back to bed.  Make sure that you drive carefully. Avoid driving if you feel very sleepy.  Keep all follow-up appointments as directed by your health care provider. This is important. SEEK MEDICAL CARE IF:   You are tired throughout the day or have trouble in your daily routine due to sleepiness.  You continue to have sleep problems or your sleep problems get worse. SEEK IMMEDIATE MEDICAL CARE IF:   You have serious thoughts about hurting yourself or someone else.   This information is not intended to replace advice given to you by your health care provider. Make sure you discuss any questions you have with your health care provider.   Document Released: 09/21/2000 Document Revised: 06/15/2015 Document Reviewed: 06/25/2014  Stress and Stress Management Stress is a normal reaction to life events. It is what you feel when life demands more than you are used to or more than you can handle. Some stress can be useful. For example, the stress reaction can help you catch the last bus of the day, study for a test, or meet a deadline at work. But stress that occurs too often or for too long can cause problems. It can affect your emotional health and interfere with relationships and normal daily activities. Too much stress can weaken your immune system and increase your risk for physical illness. If you already have a medical problem, stress can make it worse. CAUSES  All sorts of life events may cause stress. An event that causes stress for one person may not be stressful for another person. Major life events commonly cause stress. These may be positive or negative. Examples include losing your job, moving into a new home, getting married, having a baby, or losing a loved one. Less obvious life events may also cause stress, especially if they occur day after day or in combination. Examples include working long hours, driving in traffic, caring for children,  being in debt, or being in a difficult relationship. SIGNS AND SYMPTOMS Stress may cause emotional symptoms including, the following:  Anxiety. This is feeling worried, afraid, on edge, overwhelmed, or out of control.  Anger. This is feeling irritated or impatient.  Depression. This is feeling sad, down, helpless, or guilty.  Difficulty focusing, remembering, or making decisions. Stress may cause physical symptoms, including the following:   Aches and pains. These may affect your head, neck, back, stomach, or other areas of your body.  Tight muscles or clenched jaw.  Low energy or trouble sleeping. Stress may cause unhealthy behaviors, including the following:   Eating to feel better (overeating) or skipping meals.  Sleeping too little, too much, or both.  Working too much or putting off tasks (procrastination).  Smoking, drinking alcohol, or using drugs to feel better. DIAGNOSIS  Stress is diagnosed through an assessment by your health care provider. Your health care provider will ask questions about your symptoms and any stressful life events.Your health care provider will also ask about your medical history and may order blood tests or other tests. Certain medical conditions and medicine can cause physical symptoms similar to stress. Mental illness can cause emotional symptoms and unhealthy behaviors  similar to stress. Your health care provider may refer you to a mental health professional for further evaluation.  TREATMENT  Stress management is the recommended treatment for stress.The goals of stress management are reducing stressful life events and coping with stress in healthy ways.  Techniques for reducing stressful life events include the following:  Stress identification. Self-monitor for stress and identify what causes stress for you. These skills may help you to avoid some stressful events.  Time management. Set your priorities, keep a calendar of events, and learn  to say "no." These tools can help you avoid making too many commitments. Techniques for coping with stress include the following:  Rethinking the problem. Try to think realistically about stressful events rather than ignoring them or overreacting. Try to find the positives in a stressful situation rather than focusing on the negatives.  Exercise. Physical exercise can release both physical and emotional tension. The key is to find a form of exercise you enjoy and do it regularly.  Relaxation techniques. These relax the body and mind. Examples include yoga, meditation, tai chi, biofeedback, deep breathing, progressive muscle relaxation, listening to music, being out in nature, journaling, and other hobbies. Again, the key is to find one or more that you enjoy and can do regularly.  Healthy lifestyle. Eat a balanced diet, get plenty of sleep, and do not smoke. Avoid using alcohol or drugs to relax.  Strong support network. Spend time with family, friends, or other people you enjoy being around.Express your feelings and talk things over with someone you trust. Counseling or talktherapy with a mental health professional may be helpful if you are having difficulty managing stress on your own. Medicine is typically not recommended for the treatment of stress.Talk to your health care provider if you think you need medicine for symptoms of stress. HOME CARE INSTRUCTIONS  Keep all follow-up visits as directed by your health care provider.  Take all medicines as directed by your health care provider. SEEK MEDICAL CARE IF:  Your symptoms get worse or you start having new symptoms.  You feel overwhelmed by your problems and can no longer manage them on your own. SEEK IMMEDIATE MEDICAL CARE IF:  You feel like hurting yourself or someone else.   This information is not intended to replace advice given to you by your health care provider. Make sure you discuss any questions you have with your health  care provider.   Document Released: 03/20/2001 Document Revised: 10/15/2014 Document Reviewed: 05/19/2013 Elsevier Interactive Patient Education 2016 [employer].  Chartered certified accountant Patient Education [employer].

## 2015-09-16 LAB — CBC WITH DIFFERENTIAL/PLATELET
BASOS ABS: 0.1 10*3/uL (ref 0.0–0.2)
Basos: 1 %
EOS (ABSOLUTE): 0.1 10*3/uL (ref 0.0–0.4)
Eos: 2 %
HEMOGLOBIN: 13.4 g/dL (ref 11.1–15.9)
Hematocrit: 39.7 % (ref 34.0–46.6)
IMMATURE GRANS (ABS): 0 10*3/uL (ref 0.0–0.1)
IMMATURE GRANULOCYTES: 0 %
Lymphocytes Absolute: 2.7 10*3/uL (ref 0.7–3.1)
Lymphs: 49 %
MCH: 30.4 pg (ref 26.6–33.0)
MCHC: 33.8 g/dL (ref 31.5–35.7)
MCV: 90 fL (ref 79–97)
MONOCYTES: 8 %
Monocytes Absolute: 0.4 10*3/uL (ref 0.1–0.9)
NEUTROS PCT: 40 %
Neutrophils Absolute: 2.2 10*3/uL (ref 1.4–7.0)
Platelets: 355 10*3/uL (ref 150–379)
RBC: 4.41 x10E6/uL (ref 3.77–5.28)
RDW: 13.1 % (ref 12.3–15.4)
WBC: 5.5 10*3/uL (ref 3.4–10.8)

## 2015-09-16 LAB — COMPREHENSIVE METABOLIC PANEL
ALBUMIN: 4.2 g/dL (ref 3.5–5.5)
ALK PHOS: 72 IU/L (ref 39–117)
ALT: 27 IU/L (ref 0–32)
AST: 20 IU/L (ref 0–40)
Albumin/Globulin Ratio: 1.6 (ref 1.1–2.5)
BUN / CREAT RATIO: 23 (ref 9–23)
BUN: 14 mg/dL (ref 6–24)
Bilirubin Total: 0.3 mg/dL (ref 0.0–1.2)
CALCIUM: 9.7 mg/dL (ref 8.7–10.2)
CHLORIDE: 103 mmol/L (ref 97–106)
CO2: 25 mmol/L (ref 18–29)
CREATININE: 0.6 mg/dL (ref 0.57–1.00)
GFR calc Af Amer: 117 mL/min/{1.73_m2} (ref >59)
GFR calc non Af Amer: 102 mL/min/{1.73_m2} (ref >59)
GLUCOSE: 102 mg/dL — AB (ref 65–99)
Globulin, Total: 2.6 g/dL (ref 1.5–4.5)
Potassium: 4.3 mmol/L (ref 3.5–5.2)
SODIUM: 142 mmol/L (ref 136–144)
Total Protein: 6.8 g/dL (ref 6.0–8.5)

## 2015-09-16 LAB — ARGININE VASOPRESSIN HORMONE: OSMOLALITY MEAS: 296 mosm/kg — AB (ref 275–295)

## 2015-09-16 LAB — SEDIMENTATION RATE: Sed Rate: 4 mm/hr (ref 0–40)

## 2015-09-16 LAB — LIPID PANEL
CHOL/HDL RATIO: 3.5 ratio (ref 0.0–4.4)
Cholesterol, Total: 209 mg/dL — ABNORMAL HIGH (ref 100–199)
HDL: 59 mg/dL (ref >39)
LDL Calculated: 132 mg/dL — ABNORMAL HIGH (ref 0–99)
Triglycerides: 88 mg/dL (ref 0–149)
VLDL CHOLESTEROL CAL: 18 mg/dL (ref 5–40)

## 2015-09-16 LAB — TSH: TSH: 1.61 u[IU]/mL (ref 0.450–4.500)

## 2015-09-19 ENCOUNTER — Telehealth: Payer: Self-pay

## 2015-09-19 DIAGNOSIS — Z0000 Encounter for general adult medical examination without abnormal findings: Secondary | ICD-10-CM

## 2015-09-19 NOTE — Telephone Encounter (Signed)
I placed the order.  Today would be day 7 and I am not sure if [facility] still has her samples.  Couls someone please call to see if they do and if it could be added.  If not the lab slip can be placed up front for her to pick up at her convenience or she could get it when she comes for her 4 week f/u if she still wants that.

## 2015-09-19 NOTE — Telephone Encounter (Signed)
Called [facility] to add test. Jenny Reichmann reports that they will fax over verification to see if they still can run test.

## 2015-09-19 NOTE — Telephone Encounter (Signed)
-----   Message from Mar Daring, [place] sent at 09/19/2015  8:28 AM EST ----- All labs are within normal limits and stable with exception of cholesterol which was only very borderline high.  Continue a heart healthy diet and lifestyle modification with physical activity 3-4 days per week for 30-40 minutes. Labs do not need to be rechecked until next year at annual physical. Thanks! -JB

## 2015-09-19 NOTE — Telephone Encounter (Signed)
Advised patient as below. Patient was also wanting to be checked for Hep C. Could we possibly add this to lab specimen? Please advise. Thanks!

## 2015-09-21 LAB — HEPATITIS C ANTIBODY

## 2015-09-21 LAB — SPECIMEN STATUS REPORT

## 2015-09-21 NOTE — Telephone Encounter (Signed)
Patient advised as directed below. Patient verbalized understanding.  

## 2015-09-21 NOTE — Telephone Encounter (Signed)
-----   Message from Mar Daring, [place] sent at 09/21/2015 12:20 PM EST ----- Hep C is negative.

## 2015-10-13 ENCOUNTER — Ambulatory Visit: Payer: Managed Care, Other (non HMO) | Admitting: Physician Assistant

## 2016-03-12 ENCOUNTER — Telehealth: Payer: Self-pay | Admitting: Physician Assistant

## 2016-03-12 ENCOUNTER — Other Ambulatory Visit: Payer: Self-pay | Admitting: Physician Assistant

## 2016-03-12 DIAGNOSIS — Z1239 Encounter for other screening for malignant neoplasm of breast: Secondary | ICD-10-CM

## 2016-03-12 NOTE — Telephone Encounter (Signed)
Pt stated she called [facility] to schedule her mammogram and was advised that we would need to send over a referral. Pt's Last CPE was on 04/01/15 with Tawanna Sat. Pt went ahead and scheduled her CPE for 2017 on 04/05/16 @ 10 am. Please advise. Thanks TNP

## 2016-03-13 NOTE — Telephone Encounter (Signed)
I put the order in

## 2016-03-14 NOTE — Telephone Encounter (Signed)
Yes normally need CPE for mammogram.

## 2016-03-23 ENCOUNTER — Encounter: Payer: Managed Care, Other (non HMO) | Admitting: Physician Assistant

## 2016-04-02 ENCOUNTER — Telehealth: Payer: Self-pay | Admitting: Physician Assistant

## 2016-04-02 DIAGNOSIS — R7303 Prediabetes: Secondary | ICD-10-CM

## 2016-04-02 DIAGNOSIS — E7800 Pure hypercholesterolemia, unspecified: Secondary | ICD-10-CM

## 2016-04-02 DIAGNOSIS — Z0000 Encounter for general adult medical examination without abnormal findings: Secondary | ICD-10-CM

## 2016-04-02 NOTE — Telephone Encounter (Signed)
Labs ordered.

## 2016-04-02 NOTE — Telephone Encounter (Signed)
Patient advised as directed below.  Thanks,  -Ceaser Ebeling 

## 2016-04-02 NOTE — Telephone Encounter (Signed)
Pt stated she has an appt with Tawanna Sat on 04/05/16 and she usually has labs done prior to the appt so the results can be discussed at the appt. Pt would like to pick up a lab slip. Please advise. Thanks TNP

## 2016-04-02 NOTE — Telephone Encounter (Signed)
I do not know if other labs need to be added.   Thanks,  -Joseline

## 2016-04-03 ENCOUNTER — Other Ambulatory Visit: Payer: Self-pay | Admitting: Physician Assistant

## 2016-04-04 LAB — COMPREHENSIVE METABOLIC PANEL
ALK PHOS: 74 IU/L (ref 39–117)
ALT: 28 IU/L (ref 0–32)
AST: 19 IU/L (ref 0–40)
Albumin/Globulin Ratio: 1.3 (ref 1.2–2.2)
Albumin: 4.4 g/dL (ref 3.5–5.5)
BILIRUBIN TOTAL: 0.2 mg/dL (ref 0.0–1.2)
BUN/Creatinine Ratio: 21 (ref 9–23)
BUN: 13 mg/dL (ref 6–24)
CHLORIDE: 102 mmol/L (ref 96–106)
CO2: 24 mmol/L (ref 18–29)
CREATININE: 0.61 mg/dL (ref 0.57–1.00)
Calcium: 9.4 mg/dL (ref 8.7–10.2)
GFR calc Af Amer: 116 mL/min/{1.73_m2} (ref >59)
GFR calc non Af Amer: 101 mL/min/{1.73_m2} (ref >59)
GLUCOSE: 91 mg/dL (ref 65–99)
Globulin, Total: 3.4 g/dL (ref 1.5–4.5)
Potassium: 4.2 mmol/L (ref 3.5–5.2)
Sodium: 139 mmol/L (ref 134–144)
Total Protein: 7.8 g/dL (ref 6.0–8.5)

## 2016-04-04 LAB — CBC WITH DIFFERENTIAL/PLATELET
BASOS: 1 %
Basophils Absolute: 0 10*3/uL (ref 0.0–0.2)
EOS (ABSOLUTE): 0.1 10*3/uL (ref 0.0–0.4)
Eos: 2 %
Hematocrit: 41.8 % (ref 34.0–46.6)
Hemoglobin: 14.2 g/dL (ref 11.1–15.9)
IMMATURE GRANULOCYTES: 0 %
Immature Grans (Abs): 0 10*3/uL (ref 0.0–0.1)
LYMPHS ABS: 2.6 10*3/uL (ref 0.7–3.1)
Lymphs: 44 %
MCH: 30.5 pg (ref 26.6–33.0)
MCHC: 34 g/dL (ref 31.5–35.7)
MCV: 90 fL (ref 79–97)
MONOS ABS: 0.5 10*3/uL (ref 0.1–0.9)
Monocytes: 8 %
NEUTROS PCT: 45 %
Neutrophils Absolute: 2.7 10*3/uL (ref 1.4–7.0)
PLATELETS: 330 10*3/uL (ref 150–379)
RBC: 4.66 x10E6/uL (ref 3.77–5.28)
RDW: 13.9 % (ref 12.3–15.4)
WBC: 6 10*3/uL (ref 3.4–10.8)

## 2016-04-04 LAB — LIPID PANEL
CHOL/HDL RATIO: 4.8 ratio — AB (ref 0.0–4.4)
CHOLESTEROL TOTAL: 243 mg/dL — AB (ref 100–199)
HDL: 51 mg/dL (ref >39)
LDL CALC: 139 mg/dL — AB (ref 0–99)
Triglycerides: 266 mg/dL — ABNORMAL HIGH (ref 0–149)
VLDL Cholesterol Cal: 53 mg/dL — ABNORMAL HIGH (ref 5–40)

## 2016-04-04 LAB — HEMOGLOBIN A1C
Est. average glucose Bld gHb Est-mCnc: 111 mg/dL
Hgb A1c MFr Bld: 5.5 % (ref 4.8–5.6)

## 2016-04-04 LAB — TSH: TSH: 2.42 u[IU]/mL (ref 0.450–4.500)

## 2016-04-05 ENCOUNTER — Telehealth: Payer: Self-pay | Admitting: Physician Assistant

## 2016-04-05 ENCOUNTER — Encounter: Payer: Self-pay | Admitting: Physician Assistant

## 2016-04-05 ENCOUNTER — Encounter: Payer: Managed Care, Other (non HMO) | Admitting: Physician Assistant

## 2016-04-05 NOTE — Progress Notes (Deleted)
Patient: Debra [name], Female    DOB: [birth year], 58 y.o.   MRN: [id number] Visit Date: 04/05/2016  Today's Provider: Mar Daring, PA-C   No chief complaint on file.  Subjective:    Annual physical exam Debra [name] is a 60 y.o. female who presents today for health maintenance and complete physical. She feels {DESC; WELL/FAIRLY WELL/POORLY:18703}. She reports exercising ***. She reports she is sleeping {DESC; WELL/FAIRLY WELL/POORLY:18703}.  Mammogram: 04/14/2015  -----------------------------------------------------------------   Review of Systems  All other systems reviewed and are negative.   Social History      She  reports that she has quit smoking. She does not have any smokeless tobacco history on file. She reports that she drinks alcohol. She reports that she does not use illicit drugs.       Social History   Social History  . Marital Status: Married    Spouse Name: N/A  . Number of Children: N/A  . Years of Education: N/A   Social History Main Topics  . Smoking status: Former Research scientist (life sciences)  . Smokeless tobacco: Not on file  . Alcohol Use: 0.0 oz/week    0 Standard drinks or equivalent per week     Comment: OCCASIONALLY  . Drug Use: No  . Sexual Activity: Not on file   Other Topics Concern  . Not on file   Social History Narrative    No past medical history on file.   Patient Active Problem List   Diagnosis Date Noted  . H/O gestational diabetes mellitus, not currently pregnant 04/01/2015  . Benign neoplasm of colon 03/30/2015  . Dizziness 03/30/2015  . Abnormal maternal glucose tolerance, complicating pregnancy [id number]  . Hypercholesteremia 03/30/2015  . Cannot sleep 03/30/2015  . Borderline diabetes 03/30/2015  . RAD (reactive airway disease) 03/30/2015  . Arthralgia of temporomandibular joint 03/30/2015  . Avitaminosis D 03/30/2015  . H/O adenomatous polyp of colon 08/13/2014    Past Surgical History  Procedure Laterality  Date  . Shoulder surgery Right 2011  . Abdominal hysterectomy  1998  . Rhinoplasty  1988    Family History        Family Status  Relation Status Death Age  . Mother Deceased   . Sister Alive   . Brother Alive   . Brother Alive         Her family history includes Alcohol abuse in her mother; Breast cancer (age of onset: 3) in her mother; Hyperlipidemia in her brother, daughter, and sister. She was adopted.    Allergies  Allergen Reactions  . Other Itching    Seasonal   . Pravastatin Sodium     Other reaction(s): Joint Pains    No outpatient prescriptions have been marked as taking for the 04/05/16 encounter (Appointment) with Mar Daring, PA-C.    Patient Care Team: Mar Daring, PA-C as PCP - General (Physician Assistant)     Objective:   Vitals: There were no vitals taken for this visit.   Physical Exam   Depression Screen No flowsheet data found.    Assessment & Plan:     Routine Health Maintenance and Physical Exam  Exercise Activities and Dietary recommendations Goals    None      Immunization History  Administered Date(s) Administered  . Tdap 04/14/2012    Health Maintenance  Topic Date Due  . HIV Screening  07/11/1973  . PAP SMEAR  07/12/1979  . COLONOSCOPY  07/11/2008  . INFLUENZA  VACCINE  05/08/2016  . MAMMOGRAM  04/12/2017  . TETANUS/TDAP  04/14/2022  . Hepatitis C Screening  Completed      Discussed health benefits of physical activity, and encouraged her to engage in regular exercise appropriate for her age and condition.    --------------------------------------------------------------------    Mar Daring, PA-C  [facility]

## 2016-04-05 NOTE — Telephone Encounter (Signed)
error 

## 2016-04-06 NOTE — Progress Notes (Signed)
Patient left before being seen and chart had been opened.

## 2016-04-13 ENCOUNTER — Encounter: Payer: Managed Care, Other (non HMO) | Admitting: Physician Assistant

## 2016-04-27 ENCOUNTER — Ambulatory Visit (INDEPENDENT_AMBULATORY_CARE_PROVIDER_SITE_OTHER): Payer: Managed Care, Other (non HMO) | Admitting: Physician Assistant

## 2016-04-27 ENCOUNTER — Encounter: Payer: Self-pay | Admitting: Physician Assistant

## 2016-04-27 VITALS — BP 110/70 | HR 82 | Temp 98.6°F | Resp 16 | Ht 65.0 in | Wt 186.8 lb

## 2016-04-27 DIAGNOSIS — Z0000 Encounter for general adult medical examination without abnormal findings: Secondary | ICD-10-CM | POA: Diagnosis not present

## 2016-04-27 DIAGNOSIS — Z1239 Encounter for other screening for malignant neoplasm of breast: Secondary | ICD-10-CM | POA: Diagnosis not present

## 2016-04-27 DIAGNOSIS — N6320 Unspecified lump in the left breast, unspecified quadrant: Secondary | ICD-10-CM

## 2016-04-27 DIAGNOSIS — Z124 Encounter for screening for malignant neoplasm of cervix: Secondary | ICD-10-CM

## 2016-04-27 DIAGNOSIS — N63 Unspecified lump in breast: Secondary | ICD-10-CM

## 2016-04-27 NOTE — Patient Instructions (Signed)
Health Maintenance, Female Adopting a healthy lifestyle and getting preventive care can go a long way to promote health and wellness. Talk with your health care provider about what schedule of regular examinations is right for you. This is a good chance for you to check in with your provider about disease prevention and staying healthy. In between checkups, there are plenty of things you can do on your own. Experts have done a lot of research about which lifestyle changes and preventive measures are most likely to keep you healthy. Ask your health care provider for more information. WEIGHT AND DIET  Eat a healthy diet  Be sure to include plenty of vegetables, fruits, low-fat dairy products, and lean protein.  Do not eat a lot of foods high in solid fats, added sugars, or salt.  Get regular exercise. This is one of the most important things you can do for your health.  Most adults should exercise for at least 150 minutes each week. The exercise should increase your heart rate and make you sweat (moderate-intensity exercise).  Most adults should also do strengthening exercises at least twice a week. This is in addition to the moderate-intensity exercise.  Maintain a healthy weight  Body mass index (BMI) is a measurement that can be used to identify possible weight problems. It estimates body fat based on height and weight. Your health care provider can help determine your BMI and help you achieve or maintain a healthy weight.  For females 28 years of age and older:   A BMI below 18.5 is considered underweight.  A BMI of 18.5 to 24.9 is normal.  A BMI of 25 to 29.9 is considered overweight.  A BMI of 30 and above is considered obese.  Watch levels of cholesterol and blood lipids  You should start having your blood tested for lipids and cholesterol at 58 years of age, then have this test every 5 years.  You may need to have your cholesterol levels checked more often if:  Your lipid  or cholesterol levels are high.  You are older than 58 years of age.  You are at high risk for heart disease.  CANCER SCREENING   Lung Cancer  Lung cancer screening is recommended for adults 75-66 years old who are at high risk for lung cancer because of a history of smoking.  A yearly low-dose CT scan of the lungs is recommended for people who:  Currently smoke.  Have quit within the past 15 years.  Have at least a 30-pack-year history of smoking. A pack year is smoking an average of one pack of cigarettes a day for 1 year.  Yearly screening should continue until it has been 15 years since you quit.  Yearly screening should stop if you develop a health problem that would prevent you from having lung cancer treatment.  Breast Cancer  Practice breast self-awareness. This means understanding how your breasts normally appear and feel.  It also means doing regular breast self-exams. Let your health care provider know about any changes, no matter how small.  If you are in your 20s or 30s, you should have a clinical breast exam (CBE) by a health care provider every 1-3 years as part of a regular health exam.  If you are 25 or older, have a CBE every year. Also consider having a breast X-ray (mammogram) every year.  If you have a family history of breast cancer, talk to your health care provider about genetic screening.  If you  are at high risk for breast cancer, talk to your health care provider about having an MRI and a mammogram every year.  Breast cancer gene (BRCA) assessment is recommended for women who have family members with BRCA-related cancers. BRCA-related cancers include:  Breast.  Ovarian.  Tubal.  Peritoneal cancers.  Results of the assessment will determine the need for genetic counseling and BRCA1 and BRCA2 testing. Cervical Cancer Your health care provider may recommend that you be screened regularly for cancer of the pelvic organs (ovaries, uterus, and  vagina). This screening involves a pelvic examination, including checking for microscopic changes to the surface of your cervix (Pap test). You may be encouraged to have this screening done every 3 years, beginning at age 21.  For women ages 30-65, health care providers may recommend pelvic exams and Pap testing every 3 years, or they may recommend the Pap and pelvic exam, combined with testing for human papilloma virus (HPV), every 5 years. Some types of HPV increase your risk of cervical cancer. Testing for HPV may also be done on women of any age with unclear Pap test results.  Other health care providers may not recommend any screening for nonpregnant women who are considered low risk for pelvic cancer and who do not have symptoms. Ask your health care provider if a screening pelvic exam is right for you.  If you have had past treatment for cervical cancer or a condition that could lead to cancer, you need Pap tests and screening for cancer for at least 20 years after your treatment. If Pap tests have been discontinued, your risk factors (such as having a new sexual partner) need to be reassessed to determine if screening should resume. Some women have medical problems that increase the chance of getting cervical cancer. In these cases, your health care provider may recommend more frequent screening and Pap tests. Colorectal Cancer  This type of cancer can be detected and often prevented.  Routine colorectal cancer screening usually begins at 58 years of age and continues through 58 years of age.  Your health care provider may recommend screening at an earlier age if you have risk factors for colon cancer.  Your health care provider may also recommend using home test kits to check for hidden blood in the stool.  A small camera at the end of a tube can be used to examine your colon directly (sigmoidoscopy or colonoscopy). This is done to check for the earliest forms of colorectal  cancer.  Routine screening usually begins at age 50.  Direct examination of the colon should be repeated every 5-10 years through 58 years of age. However, you may need to be screened more often if early forms of precancerous polyps or small growths are found. Skin Cancer  Check your skin from head to toe regularly.  Tell your health care provider about any new moles or changes in moles, especially if there is a change in a mole's shape or color.  Also tell your health care provider if you have a mole that is larger than the size of a pencil eraser.  Always use sunscreen. Apply sunscreen liberally and repeatedly throughout the day.  Protect yourself by wearing long sleeves, pants, a wide-brimmed hat, and sunglasses whenever you are outside. HEART DISEASE, DIABETES, AND HIGH BLOOD PRESSURE   High blood pressure causes heart disease and increases the risk of stroke. High blood pressure is more likely to develop in:  People who have blood pressure in the high end   of the normal range (130-139/85-89 mm Hg).  People who are overweight or obese.  People who are African American.  If you are 38-23 years of age, have your blood pressure checked every 3-5 years. If you are 61 years of age or older, have your blood pressure checked every year. You should have your blood pressure measured twice--once when you are at a hospital or clinic, and once when you are not at a hospital or clinic. Record the average of the two measurements. To check your blood pressure when you are not at a hospital or clinic, you can use:  An automated blood pressure machine at a pharmacy.  A home blood pressure monitor.  If you are between 45 years and 39 years old, ask your health care provider if you should take aspirin to prevent strokes.  Have regular diabetes screenings. This involves taking a blood sample to check your fasting blood sugar level.  If you are at a normal weight and have a low risk for diabetes,  have this test once every three years after 58 years of age.  If you are overweight and have a high risk for diabetes, consider being tested at a younger age or more often. PREVENTING INFECTION  Hepatitis B  If you have a higher risk for hepatitis B, you should be screened for this virus. You are considered at high risk for hepatitis B if:  You were born in a country where hepatitis B is common. Ask your health care provider which countries are considered high risk.  Your parents were born in a high-risk country, and you have not been immunized against hepatitis B (hepatitis B vaccine).  You have HIV or AIDS.  You use needles to inject street drugs.  You live with someone who has hepatitis B.  You have had sex with someone who has hepatitis B.  You get hemodialysis treatment.  You take certain medicines for conditions, including cancer, organ transplantation, and autoimmune conditions. Hepatitis C  Blood testing is recommended for:  Everyone born from [birth year] through 1963.  Anyone with known risk factors for hepatitis C. Sexually transmitted infections (STIs)  You should be screened for sexually transmitted infections (STIs) including gonorrhea and chlamydia if:  You are sexually active and are younger than 58 years of age.  You are older than 58 years of age and your health care provider tells you that you are at risk for this type of infection.  Your sexual activity has changed since you were last screened and you are at an increased risk for chlamydia or gonorrhea. Ask your health care provider if you are at risk.  If you do not have HIV, but are at risk, it may be recommended that you take a prescription medicine daily to prevent HIV infection. This is called pre-exposure prophylaxis (PrEP). You are considered at risk if:  You are sexually active and do not regularly use condoms or know the HIV status of your partner(s).  You take drugs by injection.  You are sexually  active with a partner who has HIV. Talk with your health care provider about whether you are at high risk of being infected with HIV. If you choose to begin PrEP, you should first be tested for HIV. You should then be tested every 3 months for as long as you are taking PrEP.  PREGNANCY   If you are premenopausal and you may become pregnant, ask your health care provider about preconception counseling.  If you may  become pregnant, take 400 to 800 micrograms (mcg) of folic acid every day.  If you want to prevent pregnancy, talk to your health care provider about birth control (contraception). OSTEOPOROSIS AND MENOPAUSE   Osteoporosis is a disease in which the bones lose minerals and strength with aging. This can result in serious bone fractures. Your risk for osteoporosis can be identified using a bone density scan.  If you are 61 years of age or older, or if you are at risk for osteoporosis and fractures, ask your health care provider if you should be screened.  Ask your health care provider whether you should take a calcium or vitamin D supplement to lower your risk for osteoporosis.  Menopause may have certain physical symptoms and risks.  Hormone replacement therapy may reduce some of these symptoms and risks. Talk to your health care provider about whether hormone replacement therapy is right for you.  HOME CARE INSTRUCTIONS   Schedule regular health, dental, and eye exams.  Stay current with your immunizations.   Do not use any tobacco products including cigarettes, chewing tobacco, or electronic cigarettes.  If you are pregnant, do not drink alcohol.  If you are breastfeeding, limit how much and how often you drink alcohol.  Limit alcohol intake to no more than 1 drink per day for nonpregnant women. One drink equals 12 ounces of beer, 5 ounces of wine, or 1 ounces of hard liquor.  Do not use street drugs.  Do not share needles.  Ask your health care provider for help if  you need support or information about quitting drugs.  Tell your health care provider if you often feel depressed.  Tell your health care provider if you have ever been abused or do not feel safe at home.   This information is not intended to replace advice given to you by your health care provider. Make sure you discuss any questions you have with your health care provider.   Document Released: 04/09/2011 Document Revised: 10/15/2014 Document Reviewed: 08/26/2013 Elsevier Interactive Patient Education [employer].

## 2016-04-27 NOTE — Progress Notes (Signed)
Patient: Debra [name], Female    DOB: December 27, 1955, 60 y.o.   MRN: [id number] Visit Date: 04/27/2016  Today's Provider: Mar Daring, PA-C   No chief complaint on file.  Subjective:    Annual physical exam Debra [name] is a 60 y.o. female who presents today for health maintenance and complete physical. She feels well. She reports exercising 6 hours a week. She reports she is sleeping well.  Mammogram:04/14/15 Normal Colonoscopy:08/13/2014 -----------------------------------------------------------------   Review of Systems  Constitutional: Negative.   HENT: Negative.   Eyes: Negative.   Respiratory: Negative.   Cardiovascular: Negative.   Gastrointestinal: Negative.   Endocrine: Negative.   Genitourinary: Negative.   Musculoskeletal: Positive for back pain and arthralgias.  Skin: Negative.   Allergic/Immunologic: Positive for environmental allergies.  Neurological: Negative.   Hematological: Negative.   Psychiatric/Behavioral: Negative.     Social History      She  reports that she has quit smoking. She does not have any smokeless tobacco history on file. She reports that she drinks alcohol. She reports that she does not use illicit drugs.       Social History   Social History  . Marital Status: Married    Spouse Name: N/A  . Number of Children: N/A  . Years of Education: N/A   Social History Main Topics  . Smoking status: Former Research scientist (life sciences)  . Smokeless tobacco: Not on file  . Alcohol Use: 0.0 oz/week    0 Standard drinks or equivalent per week     Comment: OCCASIONALLY  . Drug Use: No  . Sexual Activity: Not on file   Other Topics Concern  . Not on file   Social History Narrative    No past medical history on file.   Patient Active Problem List   Diagnosis Date Noted  . H/O gestational diabetes mellitus, not currently pregnant 04/01/2015  . Benign neoplasm of colon 03/30/2015  . Dizziness 03/30/2015  . Abnormal maternal glucose  tolerance, complicating pregnancy [id number]  . Hypercholesteremia 03/30/2015  . Cannot sleep 03/30/2015  . Borderline diabetes 03/30/2015  . RAD (reactive airway disease) 03/30/2015  . Arthralgia of temporomandibular joint 03/30/2015  . Avitaminosis D 03/30/2015  . H/O adenomatous polyp of colon 08/13/2014    Past Surgical History  Procedure Laterality Date  . Shoulder surgery Right 2011  . Abdominal hysterectomy  1998  . Rhinoplasty  1988    Family History        Family Status  Relation Status Death Age  . Mother Deceased   . Sister Alive   . Brother Alive   . Brother Alive         Her family history includes Alcohol abuse in her mother; Breast cancer (age of onset: 61) in her mother; Hyperlipidemia in her brother, daughter, and sister. She was adopted.    Allergies  Allergen Reactions  . Other Itching    Seasonal   . Pravastatin Sodium     Other reaction(s): Joint Pains    No outpatient prescriptions have been marked as taking for the 04/27/16 encounter (Appointment) with Mar Daring, PA-C.    Patient Care Team: Mar Daring, PA-C as PCP - General (Physician Assistant)     Objective:   Vitals: There were no vitals taken for this visit.   Physical Exam  Constitutional: She is oriented to person, place, and time. She appears well-developed and well-nourished. No distress.  HENT:  Head: Normocephalic and atraumatic.  Right Ear: Hearing, tympanic membrane, external ear and ear canal normal.  Left Ear: Hearing, tympanic membrane, external ear and ear canal normal.  Nose: Nose normal.  Mouth/Throat: Uvula is midline, oropharynx is clear and moist and mucous membranes are normal. No oropharyngeal exudate.  Eyes: Conjunctivae and EOM are normal. Pupils are equal, round, and reactive to light. Right eye exhibits no discharge. Left eye exhibits no discharge. No scleral icterus.  Neck: Normal range of motion. Neck supple. No JVD present. Carotid bruit is  not present. No tracheal deviation present. No thyromegaly present.  Cardiovascular: Normal rate, regular rhythm, normal heart sounds and intact distal pulses.  Exam reveals no gallop and no friction rub.   No murmur heard. Pulmonary/Chest: Effort normal and breath sounds normal. No respiratory distress. She has no wheezes. She has no rales. She exhibits no tenderness. Right breast exhibits no inverted nipple, no mass, no nipple discharge, no skin change and no tenderness. Left breast exhibits inverted nipple (not completely inverted but flatter than right), mass (moveable, nontender mass noted behind nipple; may be an enlarged duct but with positive family history of breast cancer in mother and sister may be more concerning) and skin change (red, pruritic patch just outside of areola in 11 o'clock position). Left breast exhibits no nipple discharge and no tenderness. Breasts are symmetrical.  Abdominal: Soft. Bowel sounds are normal. She exhibits no distension and no mass. There is no tenderness. There is no rebound and no guarding. Hernia confirmed negative in the right inguinal area and confirmed negative in the left inguinal area.  Genitourinary: Rectum normal and vagina normal. No breast swelling, tenderness, discharge or bleeding. Pelvic exam was performed with patient supine. There is no rash, tenderness, lesion or injury on the right labia. There is no rash, tenderness, lesion or injury on the left labia. Right adnexum displays no mass, no tenderness and no fullness. Left adnexum displays no mass, no tenderness and no fullness. No erythema, tenderness or bleeding in the vagina. No signs of injury around the vagina. No vaginal discharge found.  S/p hysterectomy  Musculoskeletal: Normal range of motion. She exhibits no edema or tenderness.  Lymphadenopathy:    She has no cervical adenopathy.       Right: No inguinal adenopathy present.       Left: No inguinal adenopathy present.  Neurological: She  is alert and oriented to person, place, and time. She has normal reflexes. No cranial nerve deficit. Coordination normal.  Skin: Skin is warm and dry. No rash noted. She is not diaphoretic.  Psychiatric: She has a normal mood and affect. Her behavior is normal. Judgment and thought content normal.  Vitals reviewed.    Depression Screen No flowsheet data found.    Assessment & Plan:     Routine Health Maintenance and Physical Exam  Exercise Activities and Dietary recommendations Goals    None      Immunization History  Administered Date(s) Administered  . Tdap 04/14/2012    Health Maintenance  Topic Date Due  . HIV Screening  07/11/1973  . PAP SMEAR  07/12/1979  . COLONOSCOPY  07/11/2008  . INFLUENZA VACCINE  05/08/2016  . MAMMOGRAM  04/12/2017  . TETANUS/TDAP  04/14/2022  . Hepatitis C Screening  Completed      Discussed health benefits of physical activity, and encouraged her to engage in regular exercise appropriate for her age and condition.   1. Annual physical exam Normal physical exam today with exception of possible left breast  mass noted in physical exam and below. Labs were checked just prior to CPE and were normal with exception of cholesterol. Will recheck cholesterol in 6 months. She is to call the office in the meantime if she has any acute issue, questions or concerns.  2. Cervical cancer screening Pap collected today. Will send as below and f/u pending results. - Pap IG and HPV (high risk) DNA detection ([facility])  3. Breast cancer screening There was a mass noted today on the left breast behind the nipple. It was moveable and non-tender but hard to measure due to location.  There is family history of breast cancer in her biological mother and sister. She does perform regular self breast exams. Mammogram was ordered as below. Information for [facility] clinic was given to patient so she may schedule her mammogram at her convenience. - MM  Digital Diagnostic Bilat; Future - US BREAST COMPLETE UNI LEFT INC AXILLA; Future  4. Left breast mass Located behind nipple with possible associated changes including an area of red, pruritic macule like lesion noted in 11 o'clock position just outside of the areola and a flattening of the left nipple. Positive family history in biological mother and sister. Will f/u pending results. - MM Digital Diagnostic Bilat; Future - US BREAST COMPLETE UNI LEFT INC AXILLA; Future  --------------------------------------------------------------------    Mar Daring, PA-C  [facility]

## 2016-04-30 NOTE — Addendum Note (Signed)
Addended by: Mar Daring on: 04/30/2016 08:33 AM   Modules accepted: Orders

## 2016-05-01 LAB — PAP IG AND HPV HIGH-RISK
HPV, HIGH-RISK: NEGATIVE
PAP Smear Comment: 0

## 2016-05-02 ENCOUNTER — Telehealth: Payer: Self-pay

## 2016-05-02 NOTE — Telephone Encounter (Signed)
Patient advised as directed below.  Thanks,  -Dayne Dekay 

## 2016-05-02 NOTE — Telephone Encounter (Signed)
-----   Message from Mar Daring, [place] sent at 05/01/2016  4:45 PM EDT ----- Pap is normal, HPV negative.  Will repeat in 5 years.

## 2016-05-11 ENCOUNTER — Ambulatory Visit
Admission: RE | Admit: 2016-05-11 | Discharge: 2016-05-11 | Disposition: A | Discharge status: Home / Self Care | Payer: Managed Care, Other (non HMO) | Source: Ambulatory Visit | Attending: Physician Assistant | Admitting: Physician Assistant

## 2016-05-11 ENCOUNTER — Other Ambulatory Visit: Payer: Self-pay | Admitting: Physician Assistant

## 2016-05-11 DIAGNOSIS — Z1231 Encounter for screening mammogram for malignant neoplasm of breast: Secondary | ICD-10-CM | POA: Insufficient documentation

## 2016-05-11 DIAGNOSIS — N6320 Unspecified lump in the left breast, unspecified quadrant: Secondary | ICD-10-CM

## 2016-05-11 DIAGNOSIS — Z1239 Encounter for other screening for malignant neoplasm of breast: Secondary | ICD-10-CM

## 2016-05-11 DIAGNOSIS — Z803 Family history of malignant neoplasm of breast: Secondary | ICD-10-CM | POA: Diagnosis not present

## 2016-05-11 DIAGNOSIS — N63 Unspecified lump in breast: Secondary | ICD-10-CM | POA: Diagnosis not present

## 2016-05-14 ENCOUNTER — Telehealth: Payer: Self-pay

## 2016-05-14 NOTE — Telephone Encounter (Signed)
-----   Message from Mar Daring, PA-C sent at 05/11/2016  3:50 PM EDT ----- Normal mammogram and [place]. Repeat screening in one year. Monitor rash and if no improvement call the office.

## 2016-05-14 NOTE — Telephone Encounter (Signed)
Patient advised as directed below.  Thanks,  -Joseline 

## 2016-05-22 ENCOUNTER — Other Ambulatory Visit: Payer: 59

## 2016-05-22 ENCOUNTER — Ambulatory Visit: Payer: 59

## 2016-11-07 ENCOUNTER — Encounter: Payer: Self-pay | Admitting: Physician Assistant

## 2016-11-07 ENCOUNTER — Ambulatory Visit (INDEPENDENT_AMBULATORY_CARE_PROVIDER_SITE_OTHER): Payer: 59 | Admitting: Physician Assistant

## 2016-11-07 VITALS — BP 144/82 | HR 80 | Temp 98.6°F | Resp 16 | Wt 182.0 lb

## 2016-11-07 DIAGNOSIS — J0110 Acute frontal sinusitis, unspecified: Secondary | ICD-10-CM | POA: Diagnosis not present

## 2016-11-07 MED ORDER — AMOXICILLIN-POT CLAVULANATE 875-125 MG PO TABS: 1.0000 | ORAL_TABLET | Freq: Two times a day (BID) | ORAL | 0 refills | Status: AC

## 2016-11-07 NOTE — Patient Instructions (Signed)

## 2016-11-07 NOTE — Progress Notes (Signed)
Patient: Debra [name] Female    DOB: [birth year]   61 y.o.   MRN: [id number] Visit Date: 11/07/2016  Today's Provider: Trinna Post, PA-C   Chief Complaint  Patient presents with  . Sinusitis    Started about three weeks ago.   Subjective:    Sinusitis  This is a recurrent problem. The current episode started 1 to 4 weeks ago. The problem has been gradually worsening since onset. There has been no fever. Associated symptoms include congestion, ear pain (left ear pain) and sinus pressure. Pertinent negatives include no chills, diaphoresis, headaches, shortness of breath, sneezing or sore throat. Past treatments include oral decongestants and saline sprays. The treatment provided no relief.       Allergies  Allergen Reactions  . Other Itching    Seasonal   . Pravastatin Sodium     Other reaction(s): Joint Pains     Current Outpatient Prescriptions:  .  Ascorbic Acid (VITAMIN C) 1000 MG tablet, Take by mouth., Disp: , Rfl:  .  Cholecalciferol (VITAMIN D) 2000 UNITS CAPS, Take 1 capsule by mouth daily., Disp: , Rfl:  .  Cinnamon Bark POWD, daily., Disp: , Rfl:  .  Flaxseed, Linseed, (FLAX SEEDS) POWD, Take by mouth daily., Disp: , Rfl:  .  fluticasone (FLONASE) 50 MCG/ACT nasal spray, Place 2 sprays into the nose daily., Disp: , Rfl:  .  Glucosamine-Chondroit-Vit C-Mn (GLUCOSAMINE 1500 COMPLEX) CAPS, Take by mouth., Disp: , Rfl:  .  loratadine (CLARITIN) 10 MG tablet, Take 1 tablet by mouth daily., Disp: , Rfl:  .  Omega-3 Fatty Acids (FISH OIL MAXIMUM STRENGTH) 1200 MG CAPS, Take 2 capsules by mouth 2 (two) times daily., Disp: , Rfl:  .  psyllium (REGULOID) 0.52 G capsule, Take 1 capsule by mouth daily., Disp: , Rfl:  .  niacin (NIASPAN) 1000 MG CR tablet, Take 2 tablets (2,000 mg total) by mouth at bedtime. TAKE ASPIRIN AND EAT APPLE BEFORE TAKING MED TO HELP WITH FLUSHING (Patient not taking: Reported on 11/07/2016), Disp: 180 tablet, Rfl: 3  Review of Systems    Constitutional: Positive for fatigue. Negative for activity change, appetite change, chills, diaphoresis, fever and unexpected weight change.  HENT: Positive for congestion, ear pain (left ear pain), nosebleeds, postnasal drip, rhinorrhea, sinus pain and sinus pressure. Negative for ear discharge, sneezing, sore throat, trouble swallowing and voice change.   Eyes: Negative.   Respiratory: Negative.  Negative for shortness of breath.   Gastrointestinal: Negative.   Neurological: Negative for dizziness, light-headedness and headaches.    Social History  Substance Use Topics  . Smoking status: Former Research scientist (life sciences)  . Smokeless tobacco: Never Used  . Alcohol use 0.0 oz/week     Comment: OCCASIONALLY   Objective:   BP (!) 144/82 (BP Location: Left Arm, Patient Position: Sitting, Cuff Size: Normal)   Pulse 80   Temp 98.6 F (37 C) (Oral)   Resp 16   Wt 182 lb (82.6 kg)   BMI 30.29 kg/m   Physical Exam  Constitutional: She is oriented to person, place, and time. She appears well-developed and well-nourished. No distress.  HENT:  Right Ear: External ear normal.  Left Ear: External ear normal.  Nose: Right sinus exhibits maxillary sinus tenderness and frontal sinus tenderness. Left sinus exhibits maxillary sinus tenderness and frontal sinus tenderness.  Mouth/Throat: Oropharynx is clear and moist. No oropharyngeal exudate, posterior oropharyngeal edema or posterior oropharyngeal erythema.  Tms opaque bilaterally   Eyes:  Conjunctivae are normal. Right eye exhibits no discharge. Left eye exhibits no discharge.  Neck: Neck supple.  Cardiovascular: Normal rate and regular rhythm.   Pulmonary/Chest: Effort normal and breath sounds normal. No respiratory distress. She has no wheezes. She has no rales.  Lymphadenopathy:    She has no cervical adenopathy.  Neurological: She is alert and oriented to person, place, and time.  Skin: Skin is warm and dry. She is not diaphoretic.  Psychiatric: She has  a normal mood and affect. Her behavior is normal.        Assessment & Plan:     1. Acute non-recurrent frontal sinusitis  Treat as below. Patient may call back if she has yeast infection.   - amoxicillin-clavulanate (AUGMENTIN) 875-125 MG tablet; Take 1 tablet by mouth 2 (two) times daily.  Dispense: 20 tablet; Refill: 0  Return if symptoms worsen or fail to improve.   Patient Instructions  Sinusitis, Adult Sinusitis is soreness and inflammation of your sinuses. Sinuses are hollow spaces in the bones around your face. They are located:  Around your eyes.  In the middle of your forehead.  Behind your nose.  In your cheekbones. Your sinuses and nasal passages are lined with a stringy fluid (mucus). Mucus normally drains out of your sinuses. When your nasal tissues get inflamed or swollen, the mucus can get trapped or blocked so air cannot flow through your sinuses. This lets bacteria, viruses, and funguses grow, and that leads to infection. Follow these instructions at home: Medicines  Take, use, or apply over-the-counter and prescription medicines only as told by your doctor. These may include nasal sprays.  If you were prescribed an antibiotic medicine, take it as told by your doctor. Do not stop taking the antibiotic even if you start to feel better. Hydrate and Humidify  Drink enough water to keep your pee (urine) clear or pale yellow.  Use a cool mist humidifier to keep the humidity level in your home above 50%.  Breathe in steam for 10-15 minutes, 3-4 times a day or as told by your doctor. You can do this in the bathroom while a hot shower is running.  Try not to spend time in cool or dry air. Rest  Rest as much as possible.  Sleep with your head raised (elevated).  Make sure to get enough sleep each night. General instructions  Put a warm, moist washcloth on your face 3-4 times a day or as told by your doctor. This will help with discomfort.  Wash your hands  often with soap and water. If there is no soap and water, use hand sanitizer.  Do not smoke. Avoid being around people who are smoking (secondhand smoke).  Keep all follow-up visits as told by your doctor. This is important. Contact a doctor if:  You have a fever.  Your symptoms get worse.  Your symptoms do not get better within 10 days. Get help right away if:  You have a very bad headache.  You cannot stop throwing up (vomiting).  You have pain or swelling around your face or eyes.  You have trouble seeing.  You feel confused.  Your neck is stiff.  You have trouble breathing. This information is not intended to replace advice given to you by your health care provider. Make sure you discuss any questions you have with your health care provider. Document Released: 03/12/2008 Document Revised: 05/20/2016 Document Reviewed: 07/20/2015 Elsevier Interactive Patient Education  2017 [place]  entirety of the information documented in the History of Present Illness, Review of Systems and Physical Exam were personally obtained by me. Portions of this information were initially documented by Ashley Royalty, CMA and reviewed by me for thoroughness and accuracy.          Trinna Post, PA-C  [facility]

## 2016-11-09 ENCOUNTER — Telehealth: Payer: Self-pay

## 2016-11-09 DIAGNOSIS — N898 Other specified noninflammatory disorders of vagina: Secondary | ICD-10-CM

## 2016-11-09 MED ORDER — FLUCONAZOLE 150 MG PO TABS: 150.0000 mg | ORAL_TABLET | Freq: Once | ORAL | 0 refills | Status: AC

## 2016-11-09 NOTE — Telephone Encounter (Signed)
Patient states she was seen in office by Adrianna on 11/07/16 and prescribed Amoxicillin. Patient reports that she was told to call back if she had any side effects from medication, patient states that she has developed a yeast infection and would like a prescription called into [facility] in [place]. Please review and advise. KW

## 2016-11-09 NOTE — Telephone Encounter (Signed)
Sent in diflucan to [facility] on [address].

## 2016-11-09 NOTE — Telephone Encounter (Signed)
Left message advising pt.   Thanks,   -Ebenezer Mccaskey  

## 2016-11-09 NOTE — Telephone Encounter (Signed)
Please review-aa 

## 2017-02-22 ENCOUNTER — Ambulatory Visit (INDEPENDENT_AMBULATORY_CARE_PROVIDER_SITE_OTHER): Payer: 59 | Admitting: Physician Assistant

## 2017-02-22 ENCOUNTER — Encounter: Payer: Self-pay | Admitting: Physician Assistant

## 2017-02-22 VITALS — BP 136/80 | HR 80 | Temp 98.2°F | Resp 16 | Ht 65.0 in | Wt 184.6 lb

## 2017-02-22 DIAGNOSIS — J3489 Other specified disorders of nose and nasal sinuses: Secondary | ICD-10-CM

## 2017-02-22 DIAGNOSIS — R51 Headache: Secondary | ICD-10-CM | POA: Diagnosis not present

## 2017-02-22 DIAGNOSIS — R519 Headache, unspecified: Secondary | ICD-10-CM

## 2017-02-22 DIAGNOSIS — H538 Other visual disturbances: Secondary | ICD-10-CM

## 2017-02-22 DIAGNOSIS — H9311 Tinnitus, right ear: Secondary | ICD-10-CM

## 2017-02-22 DIAGNOSIS — E559 Vitamin D deficiency, unspecified: Secondary | ICD-10-CM | POA: Diagnosis not present

## 2017-02-22 MED ORDER — BACIT-POLY-NEO HC 1 % EX OINT: 1.0000 "application " | TOPICAL_OINTMENT | Freq: Two times a day (BID) | CUTANEOUS | 0 refills | Status: DC

## 2017-02-22 NOTE — Progress Notes (Signed)
Patient: Debra [name] Female    DOB: 09-Sep-1956   61 y.o.   MRN: [id number] Visit Date: 02/22/2017  Today's Provider: Mar Daring, PA-C   Chief Complaint  Patient presents with  . Rash   Subjective:    HPI Patient here today C/O of lesion in left nostril x's 2 months. Patient reports she has been using OTC neosporin and reports moderate improvement. Patient reports she does have allergies and that may be irritating her lesion.   Patient C/O of blurred vision and headache yesterday while at work sitting at her desk. Patient reports that blurred vision lasted at 20 minutes and the headache at 10 seconds. Patient reports that her pupils were very small while she was having the blurred vision. Patient denies any syncope or weakness. Patient reports ringing in her ears yesterday afternoon and lasted about 10 minutes.  Patient reports that on Wednesday she had a single episode of heart burn with foods that she eats regularly. Patient reports that she took Tums and reports relief with medication.   She does report she has been under more stress this weekend. Her son graduated from [employer] on Saturday and they had 26 people over at a cabin they had rented for a graduation party. Then she has had her son move back in with her and her husband until July. She reports this has caused more stress because of the clutter and her son not cleaning up after himself.     Allergies  Allergen Reactions  . Other Itching    Seasonal   . Pravastatin Sodium     Other reaction(s): Joint Pains     Current Outpatient Prescriptions:  .  Ascorbic Acid (VITAMIN C) 1000 MG tablet, Take 1,000 mg by mouth daily. , Disp: , Rfl:  .  Cholecalciferol (VITAMIN D) 2000 UNITS CAPS, Take 1 capsule by mouth daily., Disp: , Rfl:  .  Cinnamon Bark POWD, daily., Disp: , Rfl:  .  Flaxseed, Linseed, (FLAX SEEDS) POWD, Take by mouth daily., Disp: , Rfl:  .  fluticasone (FLONASE) 50 MCG/ACT nasal spray, Place  2 sprays into the nose daily., Disp: , Rfl:  .  loratadine (CLARITIN) 10 MG tablet, Take 1 tablet by mouth daily., Disp: , Rfl:  .  Omega-3 Fatty Acids (FISH OIL MAXIMUM STRENGTH) 1200 MG CAPS, Take 2 capsules by mouth 2 (two) times daily., Disp: , Rfl:   Review of Systems  Constitutional: Negative.   HENT: Positive for congestion, postnasal drip, rhinorrhea, sinus pressure and tinnitus (right). Negative for ear discharge, ear pain, sinus pain, sore throat and trouble swallowing.   Eyes: Positive for visual disturbance (blurred in right eye and wavy peripheral vision just before headache onset).  Respiratory: Negative.   Cardiovascular: Negative.   Gastrointestinal: Negative for abdominal pain, diarrhea, nausea and vomiting.       Heart burn  Skin: Positive for rash.  Allergic/Immunologic: Positive for environmental allergies.  Neurological: Positive for dizziness and headaches (right sided).    Social History  Substance Use Topics  . Smoking status: Former Research scientist (life sciences)  . Smokeless tobacco: Never Used  . Alcohol use 0.0 oz/week     Comment: OCCASIONALLY   Objective:   BP 136/80 (BP Location: Left Arm, Patient Position: Sitting, Cuff Size: Large)   Pulse 80   Temp 98.2 F (36.8 C)   Resp 16   Ht 5\' 5"  (1.651 m)   Wt 184 lb 9.6 oz (83.7 kg)  SpO2 98%   BMI 30.72 kg/m  Vitals:   02/22/17 0835  BP: 136/80  Pulse: 80  Resp: 16  Temp: 98.2 F (36.8 C)  SpO2: 98%  Weight: 184 lb 9.6 oz (83.7 kg)  Height: 5\' 5"  (1.651 m)     Physical Exam  Constitutional: She is oriented to person, place, and time. She appears well-developed and well-nourished. No distress.  HENT:  Head: Normocephalic and atraumatic.  Right Ear: Hearing, tympanic membrane, external ear and ear canal normal.  Left Ear: Hearing, tympanic membrane, external ear and ear canal normal.  Nose: Mucosal edema, rhinorrhea and sinus tenderness present. No epistaxis.    Mouth/Throat: Uvula is midline, oropharynx is  clear and moist and mucous membranes are normal. No oropharyngeal exudate.  Eyes: Conjunctivae are normal. Pupils are equal, round, and reactive to light. Right eye exhibits no discharge. Left eye exhibits no discharge. No scleral icterus.  Neck: Normal range of motion. Neck supple. Carotid bruit is not present. No tracheal deviation present. No thyromegaly present.  Cardiovascular: Normal rate, regular rhythm and normal heart sounds.  Exam reveals no gallop and no friction rub.   No murmur heard. Pulmonary/Chest: Effort normal and breath sounds normal. No stridor. No respiratory distress. She has no wheezes. She has no rales.  Lymphadenopathy:    She has no cervical adenopathy.  Neurological: She is alert and oriented to person, place, and time. She has normal strength. No cranial nerve deficit or sensory deficit. She displays a negative Romberg sign. Coordination and gait normal.  Skin: Skin is warm and dry. She is not diaphoretic.  Vitals reviewed.      Assessment & Plan:     1. Avitaminosis D Will check labs as below to R/O organic cause. Suspect stress induced migraine with aura. Once headache resolved all symptoms resolved. She has not had since. She does not have a history of migraines however. We will check labs as below and I will f/u pending results. She is to call if she develops any recurrence of symptoms and I will refer her to neurology (patient prefers neurology referral prior to imaging).  - CBC w/Diff/Platelet - Basic Metabolic Panel (BMET) - TSH - Vitamin D (25 hydroxy)  2. Blurred vision, right eye See above medical treatment plan. - CBC w/Diff/Platelet - Basic Metabolic Panel (BMET) - TSH - Vitamin D (25 hydroxy)  3. Acute nonintractable headache, unspecified headache type See above medical treatment plan. - CBC w/Diff/Platelet - Basic Metabolic Panel (BMET) - TSH - Vitamin D (25 hydroxy)  4. Tinnitus of right ear See above medical treatment plan. - CBC  w/Diff/Platelet - Basic Metabolic Panel (BMET) - TSH - Vitamin D (25 hydroxy)  5. Sore in nose She does have a sore in the left nostril just at the tip. Suspect irritation from seasonal allergies. Will give cortisporin as below. She is to call if it does not heal in 2 weeks and will refer to ENT.  - bacitracin-neomycin-polymyxin-hydrocortisone (CORTISPORIN) 1 % ointment; Apply 1 application topically 2 (two) times daily.  Dispense: 15 g; Refill: 0       Mar Daring, PA-C  [facility]

## 2017-02-22 NOTE — Patient Instructions (Signed)
10 Relaxation Techniques That Zap Stress Fast By Jeannette Moninger   Listen  Relax. You deserve it, it's good for you, and it takes less time than you think. You don't need a spa weekend or a retreat. Each of these stress-relieving tips can get you from OMG to om in less than 15 minutes. 1. Meditate  A few minutes of practice per day can help ease anxiety. "Research suggests that daily meditation may alter the brain's neural pathways, making you more resilient to stress," says psychologist Robbie Maller Hartman, PhD, a [place] health and wellness coach. It's simple. Sit up straight with both feet on the floor. Close your eyes. Focus your attention on reciting -- out loud or silently -- a positive mantra such as "I feel at peace" or "I love myself." Place one hand on your belly to sync the mantra with your breaths. Let any distracting thoughts float by like clouds. 2. Breathe Deeply  Take a 5-minute break and focus on your breathing. Sit up straight, eyes closed, with a hand on your belly. Slowly inhale through your nose, feeling the breath start in your abdomen and work its way to the top of your head. Reverse the process as you exhale through your mouth.  "Deep breathing counters the effects of stress by slowing the heart rate and lowering blood pressure," psychologist Judith Tutin, PhD, says. She's a certified life coach in [place], [place] 3. Be Present  Slow down.  "Take 5 minutes and focus on only one behavior with awareness," Tutin says. Notice how the air feels on your face when you're walking and how your feet feel hitting the ground. Enjoy the texture and taste of each bite of food. When you spend time in the moment and focus on your senses, you should feel less tense. 4. Reach Out  Your social network is one of your best tools for handling stress. Talk to others -- preferably face to face, or at least on the phone. Share what's going on. You can get a fresh perspective while keeping your  connection strong. 5. Tune In to Your Body  Mentally scan your body to get a sense of how stress affects it each day. Lie on your back, or sit with your feet on the floor. Start at your toes and work your way up to your scalp, noticing how your body feels.  10 Relaxation Techniques That Zap Stress Fast By Jeannette Moninger   Listen  "Simply be aware of places you feel tight or loose without trying to change anything," Tutin says. For 1 to 2 minutes, imagine each deep breath flowing to that body part. Repeat this process as you move your focus up your body, paying close attention to sensations you feel in each body part. 6. Decompress  Place a warm heat wrap around your neck and shoulders for 10 minutes. Close your eyes and relax your face, neck, upper chest, and back muscles. Remove the wrap, and use a tennis ball or foam roller to massage away tension.  "Place the ball between your back and the wall. Lean into the ball, and hold gentle pressure for up to 15 seconds. Then move the ball to another spot, and apply pressure," says Cathy Benninger, a nurse practitioner and assistant professor at [employer] [facility] in [place]. 7. Laugh Out Loud  A good belly laugh doesn't just lighten the load mentally. It lowers cortisol, your body's stress hormone, and boosts brain chemicals called endorphins, which help   your mood. Lighten up by tuning in to your favorite sitcom or video, reading the comics, or chatting with someone who makes you smile. 8. Crank Up the Tunes  Research shows that listening to soothing music can lower blood pressure, heart rate, and anxiety. "Create a playlist of songs or nature sounds (the ocean, a bubbling brook, birds chirping), and allow your mind to focus on the different melodies, instruments, or singers in the piece," Benninger says. You also can blow off steam by rocking out to more upbeat tunes -- or singing at the top of your lungs! 9. Get Moving   You don't have to run in order to get a runner's high. All forms of exercise, including yoga and walking, can ease depression and anxiety by helping the brain release feel-good chemicals and by giving your body a chance to practice dealing with stress. You can go for a quick walk around the block, take the stairs up and down a few flights, or do some stretching exercises like head rolls and shoulder shrugs. 10. Be Grateful  Keep a gratitude journal or several (one by your bed, one in your purse, and one at work) to help you remember all the things that are good in your life.  "Being grateful for your blessings cancels out negative thoughts and worries," says Joni Emmerling, a wellness coach in [place], [place].  Use these journals to savor good experiences like a child's smile, a sunshine-filled day, and good health. Don't forget to celebrate accomplishments like mastering a new task at work or a new hobby. When you start feeling stressed, spend a few minutes looking through your notes to remind yourself what really matters.  

## 2017-02-23 LAB — BASIC METABOLIC PANEL
BUN/Creatinine Ratio: 24 — ABNORMAL HIGH (ref 9–23)
BUN: 16 mg/dL (ref 6–24)
CALCIUM: 9.9 mg/dL (ref 8.7–10.2)
CO2: 24 mmol/L (ref 18–29)
CREATININE: 0.66 mg/dL (ref 0.57–1.00)
Chloride: 99 mmol/L (ref 96–106)
GFR, EST AFRICAN AMERICAN: 113 mL/min/{1.73_m2} (ref >59)
GFR, EST NON AFRICAN AMERICAN: 98 mL/min/{1.73_m2} (ref >59)
Glucose: 103 mg/dL — ABNORMAL HIGH (ref 65–99)
POTASSIUM: 4.4 mmol/L (ref 3.5–5.2)
SODIUM: 138 mmol/L (ref 134–144)

## 2017-02-23 LAB — CBC WITH DIFFERENTIAL/PLATELET
BASOS: 1 %
Basophils Absolute: 0 10*3/uL (ref 0.0–0.2)
EOS (ABSOLUTE): 0.1 10*3/uL (ref 0.0–0.4)
EOS: 2 %
HEMATOCRIT: 43.1 % (ref 34.0–46.6)
HEMOGLOBIN: 14.4 g/dL (ref 11.1–15.9)
Immature Grans (Abs): 0 10*3/uL (ref 0.0–0.1)
Immature Granulocytes: 0 %
LYMPHS ABS: 2.8 10*3/uL (ref 0.7–3.1)
Lymphs: 42 %
MCH: 29.6 pg (ref 26.6–33.0)
MCHC: 33.4 g/dL (ref 31.5–35.7)
MCV: 89 fL (ref 79–97)
Monocytes Absolute: 0.5 10*3/uL (ref 0.1–0.9)
Monocytes: 8 %
NEUTROS ABS: 3.1 10*3/uL (ref 1.4–7.0)
Neutrophils: 47 %
Platelets: 320 10*3/uL (ref 150–379)
RBC: 4.87 x10E6/uL (ref 3.77–5.28)
RDW: 13.9 % (ref 12.3–15.4)
WBC: 6.5 10*3/uL (ref 3.4–10.8)

## 2017-02-23 LAB — TSH: TSH: 1.88 u[IU]/mL (ref 0.450–4.500)

## 2017-02-23 LAB — VITAMIN D 25 HYDROXY (VIT D DEFICIENCY, FRACTURES): Vit D, 25-Hydroxy: 26.1 ng/mL — ABNORMAL LOW (ref 30.0–100.0)

## 2017-03-05 ENCOUNTER — Other Ambulatory Visit: Payer: Self-pay

## 2017-03-05 DIAGNOSIS — E559 Vitamin D deficiency, unspecified: Secondary | ICD-10-CM

## 2017-03-05 MED ORDER — VITAMIN D (ERGOCALCIFEROL) 1.25 MG (50000 UNIT) PO CAPS: 50000.0000 [IU] | ORAL_CAPSULE | ORAL | 1 refills | Status: DC

## 2017-03-05 NOTE — Telephone Encounter (Signed)
Patient advised as directed below. Per patient she is taking the OTC 2000IU BID. She reports that whatever you think is right for her that she is ok.  Thanks,  -Josuha Fontanez

## 2017-03-05 NOTE — Telephone Encounter (Signed)
-----   Message from Mar Daring, [place] sent at 03/05/2017  2:13 PM EDT ----- All labs are within normal limits and stable with exception of Vit D which is still borderline low even with OTC 2000IU. Would recommend going to high dose 50,000 IU weekly if patient agreeable.  Thanks! -JB

## 2017-03-05 NOTE — Telephone Encounter (Signed)
[id number]-KW 

## 2017-03-08 ENCOUNTER — Telehealth: Payer: Self-pay

## 2017-03-08 DIAGNOSIS — M5430 Sciatica, unspecified side: Secondary | ICD-10-CM

## 2017-03-08 MED ORDER — METHYLPREDNISOLONE 4 MG PO TBPK: ORAL_TABLET | ORAL | 0 refills | Status: DC

## 2017-03-08 NOTE — Telephone Encounter (Signed)
Patient called to report sciatica flare. She states the pain is unbearable. She has used ibuprofen, ice, and stretches with only mild relief. Patient would like a call back with recommendations.   [facility]  CB#[phone]

## 2017-03-08 NOTE — Telephone Encounter (Signed)
Medrol dose pak sent in for her to see if that helps. Continue heat and stretches

## 2017-03-08 NOTE — Telephone Encounter (Signed)
Patient advised.

## 2017-03-11 ENCOUNTER — Telehealth: Payer: Self-pay | Admitting: Physician Assistant

## 2017-03-11 NOTE — Telephone Encounter (Signed)
Pt reports that the Vitamin D 50,000 that she taking every day is making her joints hurt really bad and wants to know if she can take 7,000 units daily instead of taking the full 50,000 units once a week. Please advise. Thanks

## 2017-03-11 NOTE — Telephone Encounter (Signed)
Yes this is ok 

## 2017-03-11 NOTE — Telephone Encounter (Signed)
Pt request a nurse to return her call to discuss questions that she has about the Vitamin D, Ergocalciferol, (DRISDOL) 50000 units CAPS capsule. Please advise. Thanks TNP

## 2017-03-11 NOTE — Telephone Encounter (Signed)
Pt advised.

## 2017-03-28 ENCOUNTER — Ambulatory Visit: Payer: 59 | Admitting: Physician Assistant

## 2017-03-29 ENCOUNTER — Ambulatory Visit: Payer: 59 | Admitting: Physician Assistant

## 2017-04-03 ENCOUNTER — Telehealth: Payer: Self-pay | Admitting: Physician Assistant

## 2017-04-03 DIAGNOSIS — M5441 Lumbago with sciatica, right side: Principal | ICD-10-CM

## 2017-04-03 DIAGNOSIS — G8929 Other chronic pain: Secondary | ICD-10-CM

## 2017-04-03 DIAGNOSIS — M5442 Lumbago with sciatica, left side: Principal | ICD-10-CM

## 2017-04-03 NOTE — Telephone Encounter (Signed)
LMTCB ED 

## 2017-04-03 NOTE — Telephone Encounter (Signed)
Would recommend either PT and/or rotho referral since pain has returned.

## 2017-04-03 NOTE — Telephone Encounter (Signed)
Ortho referral placed

## 2017-04-03 NOTE — Telephone Encounter (Signed)
Pt was in about 3 weeks ago.  Was in for pain in her buttocks.  She has been on 2 weeks on prednisone.  She has finished.  The pain has came back./  Please advise  [phone]  Thanks teri

## 2017-04-03 NOTE — Telephone Encounter (Signed)
Patient advised as below. Patient agreed to ortho referral.

## 2017-05-02 ENCOUNTER — Encounter: Payer: 59 | Admitting: Physician Assistant

## 2017-06-28 ENCOUNTER — Encounter: Payer: Self-pay | Admitting: Family Medicine

## 2017-06-28 ENCOUNTER — Ambulatory Visit (INDEPENDENT_AMBULATORY_CARE_PROVIDER_SITE_OTHER): Payer: 59 | Admitting: Family Medicine

## 2017-06-28 VITALS — BP 130/78 | HR 94 | Temp 98.5°F | Wt 185.8 lb

## 2017-06-28 DIAGNOSIS — B028 Zoster with other complications: Secondary | ICD-10-CM

## 2017-06-28 DIAGNOSIS — J029 Acute pharyngitis, unspecified: Secondary | ICD-10-CM

## 2017-06-28 LAB — POCT RAPID STREP A (OFFICE): RAPID STREP A SCREEN: NEGATIVE

## 2017-06-28 MED ORDER — FIRST-DUKES MOUTHWASH MT SUSP: 5.0000 mL | Freq: Three times a day (TID) | OROMUCOSAL | 0 refills | Status: DC

## 2017-06-28 MED ORDER — FAMCICLOVIR 500 MG PO TABS: 500.0000 mg | ORAL_TABLET | Freq: Three times a day (TID) | ORAL | 0 refills | Status: DC

## 2017-06-28 NOTE — Progress Notes (Signed)
Patient: Debra [name] Female    DOB: 20-Jul-1956   61 y.o.   MRN: [id number] Visit Date: 06/28/2017  Today's Provider: Vernie Murders, PA   Chief Complaint  Patient presents with  . Sore Throat   Subjective:    Sore Throat   This is a new problem. The current episode started today. The problem has been unchanged. Neither side of throat is experiencing more pain than the other. There has been no fever. She has tried nothing for the symptoms.   No past medical history on file. Patient Active Problem List   Diagnosis Date Noted  . H/O gestational diabetes mellitus, not currently pregnant 04/01/2015  . Benign neoplasm of colon 03/30/2015  . Dizziness 03/30/2015  . Abnormal maternal glucose tolerance, complicating pregnancy 81/10/7508  . Hypercholesteremia 03/30/2015  . Cannot sleep 03/30/2015  . Borderline diabetes 03/30/2015  . RAD (reactive airway disease) 03/30/2015  . Arthralgia of temporomandibular joint 03/30/2015  . Avitaminosis D 03/30/2015  . H/O adenomatous polyp of colon 08/13/2014   Past Surgical History:  Procedure Laterality Date  . ABDOMINAL HYSTERECTOMY  1998  . RHINOPLASTY  1988  . SHOULDER SURGERY Right 2011   Family History  Problem Relation Age of Onset  . Adopted: Yes  . Breast cancer Mother 71  . Alcohol abuse Mother   . Hyperlipidemia Sister   . Hyperlipidemia Brother   . Hyperlipidemia Daughter    Allergies  Allergen Reactions  . Other Itching    Seasonal   . Pravastatin Sodium     Other reaction(s): Joint Pains     Previous Medications   ASCORBIC ACID (VITAMIN C) 1000 MG TABLET    Take 1,000 mg by mouth daily.    CHOLECALCIFEROL (VITAMIN D) 2000 UNITS CAPS    Take 1 capsule by mouth daily.   CINNAMON BARK POWD    daily.   FLAXSEED, LINSEED, (FLAX SEEDS) POWD    Take by mouth daily.   FLUTICASONE (FLONASE) 50 MCG/ACT NASAL SPRAY    Place 2 sprays into the nose daily.   GABAPENTIN (NEURONTIN) 300 MG CAPSULE    Take 300 mg by mouth at  bedtime.   LORATADINE (CLARITIN) 10 MG TABLET    Take 1 tablet by mouth daily.   OMEGA-3 FATTY ACIDS (FISH OIL MAXIMUM STRENGTH) 1200 MG CAPS    Take 2 capsules by mouth 2 (two) times daily.   VITAMIN D, ERGOCALCIFEROL, (DRISDOL) 50000 UNITS CAPS CAPSULE    Take 1 capsule (50,000 Units total) by mouth every 7 (seven) days.    Review of Systems  Constitutional: Negative.   HENT: Positive for sore throat.   Respiratory: Negative.   Cardiovascular: Negative.     Social History  Substance Use Topics  . Smoking status: Former Research scientist (life sciences)  . Smokeless tobacco: Never Used  . Alcohol use 0.0 oz/week     Comment: OCCASIONALLY   Objective:   BP 130/78 (BP Location: Right Arm, Patient Position: Sitting, Cuff Size: Normal)   Pulse 94   Temp 98.5 F (36.9 C) (Oral)   Wt 185 lb 12.8 oz (84.3 kg)   SpO2 98%   BMI 30.92 kg/m   Physical Exam  Constitutional: She is oriented to person, place, and time. She appears well-developed and well-nourished. No distress.  HENT:  Head: Normocephalic and atraumatic.  Right Ear: Hearing and external ear normal.  Left Ear: Hearing and external ear normal.  Nose: Nose normal.  Red tender vesicular lesions on the right side of the  palate down the right tonsillar pillar to the tonsil.  Eyes: Conjunctivae and lids are normal. Right eye exhibits no discharge. Left eye exhibits no discharge. No scleral icterus.  Neck: Neck supple.  Questionable early right submandibular node enlargement.  Cardiovascular: Normal rate and regular rhythm.   Pulmonary/Chest: Effort normal. No respiratory distress.  Musculoskeletal: Normal range of motion.  Neurological: She is alert and oriented to person, place, and time.  Skin: Skin is intact. No lesion and no rash noted.  Psychiatric: She has a normal mood and affect. Her speech is normal and behavior is normal. Thought content normal.      Assessment & Plan:     1. Herpes zoster infection of oral mucosa Noticed red tender  spots with some blisters on the right posterior soft palate that trails off into the tonsillar pillar and tonsil on the right. Onset yesterday without fever. Questionable increase in right submandibular node size. Treat with Famvir for a week and Dukes Magic Mouthwash for discomfort. May use Tylenol or Advil if needed for extra pain relief. Recheck in 5-7 days if no better. - Diphenhyd-Hydrocort-Nystatin (FIRST-DUKES MOUTHWASH) SUSP; Use as directed 5 mLs in the mouth or throat 4 (four) times daily - after meals and at bedtime.  Dispense: 237 mL; Refill: 0 - famciclovir (FAMVIR) 500 MG tablet; Take 1 tablet (500 mg total) by mouth 3 (three) times daily.  Dispense: 21 tablet; Refill: 0  2. Sore throat Onset yesterday with red spots on the roof of her mouth. Rapid strep test is negative. Will treat with Magic Mouthwash for discomfort. Recheck if no better in 5-7 days. - POCT rapid strep A - Diphenhyd-Hydrocort-Nystatin (FIRST-DUKES MOUTHWASH) SUSP; Use as directed 5 mLs in the mouth or throat 4 (four) times daily - after meals and at bedtime.  Dispense: 237 mL; Refill: 0

## 2017-08-13 ENCOUNTER — Other Ambulatory Visit: Payer: Self-pay | Admitting: Specialist

## 2017-08-13 DIAGNOSIS — M5126 Other intervertebral disc displacement, lumbar region: Secondary | ICD-10-CM

## 2017-08-15 ENCOUNTER — Encounter: Payer: Self-pay | Admitting: Physician Assistant

## 2017-08-15 ENCOUNTER — Ambulatory Visit (INDEPENDENT_AMBULATORY_CARE_PROVIDER_SITE_OTHER): Payer: 59 | Admitting: Physician Assistant

## 2017-08-15 VITALS — BP 160/80 | HR 79 | Temp 98.6°F | Resp 16 | Ht 65.0 in | Wt 186.6 lb

## 2017-08-15 DIAGNOSIS — R7303 Prediabetes: Secondary | ICD-10-CM

## 2017-08-15 DIAGNOSIS — Z0000 Encounter for general adult medical examination without abnormal findings: Secondary | ICD-10-CM

## 2017-08-15 DIAGNOSIS — E7800 Pure hypercholesterolemia, unspecified: Secondary | ICD-10-CM | POA: Diagnosis not present

## 2017-08-15 DIAGNOSIS — Z23 Encounter for immunization: Secondary | ICD-10-CM | POA: Diagnosis not present

## 2017-08-15 DIAGNOSIS — R0789 Other chest pain: Secondary | ICD-10-CM

## 2017-08-15 NOTE — Patient Instructions (Signed)

## 2017-08-15 NOTE — Progress Notes (Signed)
Patient: Debra [name], Female    DOB: 12/10/1955, 61 y.o.   MRN: [id number] Visit Date: 08/15/2017  Today's Provider: Mar Daring, PA-C   Chief Complaint  Patient presents with  . Annual Exam   Subjective:    Annual physical exam Debra [name] is a 61 y.o. female who presents today for health maintenance and complete physical. She feels fairly well. She reports exercising, none.patient reports that she fell back in Nov 2017 Torn tendons and had bursitis on left hip, folowed by Dr. Shelda Pal with [facility]. She reports she is sleeping well.  CPE:04/27/2016 Mammogram:05/11/16-BI-RADS 2:Benign Colonoscopy:08/13/14-Per Pathology repeat 3-5 years. Pap:04/27/2016 Normal, HPV-Negative -----------------------------------------------------------------   Review of Systems  Constitutional: Positive for activity change.  HENT: Positive for postnasal drip, rhinorrhea and sinus pressure.   Eyes: Negative.   Respiratory: Negative.   Cardiovascular: Positive for chest pain ("sometimes").  Gastrointestinal: Negative.   Endocrine: Negative.   Genitourinary: Negative.   Musculoskeletal: Positive for back pain.  Skin: Negative.   Allergic/Immunologic: Positive for environmental allergies.  Neurological: Negative.   Hematological: Negative.   Psychiatric/Behavioral: Negative.     Social History      She  reports that she has quit smoking. she has never used smokeless tobacco. She reports that she drinks alcohol. She reports that she does not use drugs.       Social History   Socioeconomic History  . Marital status: Married    Spouse name: None  . Number of children: None  . Years of education: None  . Highest education level: None  Social Needs  . Financial resource strain: None  . Food insecurity - worry: None  . Food insecurity - inability: None  . Transportation needs - medical: None  . Transportation needs - non-medical: None  Occupational History    . None  Tobacco Use  . Smoking status: Former Research scientist (life sciences)  . Smokeless tobacco: Never Used  Substance and Sexual Activity  . Alcohol use: Yes    Alcohol/week: 0.0 oz    Comment: OCCASIONALLY  . Drug use: No  . Sexual activity: None  Other Topics Concern  . None  Social History Narrative  . None    History reviewed. No pertinent past medical history.   Patient Active Problem List   Diagnosis Date Noted  . H/O gestational diabetes mellitus, not currently pregnant 04/01/2015  . Benign neoplasm of colon 03/30/2015  . Dizziness 03/30/2015  . Abnormal maternal glucose tolerance, complicating pregnancy 38/75/6433  . Hypercholesteremia 03/30/2015  . Cannot sleep 03/30/2015  . Borderline diabetes 03/30/2015  . RAD (reactive airway disease) 03/30/2015  . Arthralgia of temporomandibular joint 03/30/2015  . Avitaminosis D 03/30/2015  . H/O adenomatous polyp of colon 08/13/2014    Past Surgical History:  Procedure Laterality Date  . ABDOMINAL HYSTERECTOMY  1998  . RHINOPLASTY  1988  . SHOULDER SURGERY Right 2011    Family History        Family Status  Relation Name Status  . Mother  Deceased  . Sister 1 Alive  . Brother 1 Alive  . Brother 2 Alive  . Daughter  (Not Specified)        Her family history includes Alcohol abuse in her mother; Breast cancer (age of onset: 6) in her mother; Hyperlipidemia in her brother, daughter, and sister. She was adopted.     Allergies  Allergen Reactions  . Other Itching    Seasonal   . Pravastatin Sodium  Other reaction(s): Joint Pains     Current Outpatient Medications:  .  Ascorbic Acid (VITAMIN C) 1000 MG tablet, Take 1,000 mg by mouth daily. , Disp: , Rfl:  .  Cholecalciferol (VITAMIN D) 2000 UNITS CAPS, Take 1 capsule by mouth daily., Disp: , Rfl:  .  Cinnamon Bark POWD, daily., Disp: , Rfl:  .  Flaxseed, Linseed, (FLAX SEEDS) POWD, Take by mouth daily., Disp: , Rfl:  .  fluticasone (FLONASE) 50 MCG/ACT nasal spray, Place 2  sprays into the nose daily., Disp: , Rfl:  .  loratadine (CLARITIN) 10 MG tablet, Take 1 tablet by mouth daily., Disp: , Rfl:  .  Melatonin 500 MCG TBDP, Take by mouth., Disp: , Rfl:  .  Omega-3 Fatty Acids (FISH OIL MAXIMUM STRENGTH) 1200 MG CAPS, Take 2 capsules by mouth 2 (two) times daily., Disp: , Rfl:    Patient Care Team: Rubye Beach as PCP - General (Physician Assistant)      Objective:   Vitals: BP (!) 160/80 (BP Location: Left Arm, Patient Position: Sitting, Cuff Size: Normal)   Pulse 79   Temp 98.6 F (37 C) (Oral)   Resp 16   Ht 5\' 5"  (1.651 m)   Wt 186 lb 9.6 oz (84.6 kg)   BMI 31.05 kg/m    Physical Exam  Constitutional: She is oriented to person, place, and time. She appears well-developed and well-nourished. No distress.  HENT:  Head: Normocephalic and atraumatic.  Right Ear: Hearing, tympanic membrane, external ear and ear canal normal.  Left Ear: Hearing, tympanic membrane, external ear and ear canal normal.  Nose: Nose normal.  Mouth/Throat: Uvula is midline, oropharynx is clear and moist and mucous membranes are normal. No oropharyngeal exudate.  Eyes: Conjunctivae and EOM are normal. Pupils are equal, round, and reactive to light. Right eye exhibits no discharge. Left eye exhibits no discharge. No scleral icterus.  Neck: Normal range of motion. Neck supple. No JVD present. Carotid bruit is not present. No tracheal deviation present. No thyromegaly present.  Cardiovascular: Normal rate, regular rhythm, normal heart sounds and intact distal pulses. Exam reveals no gallop and no friction rub.  No murmur heard. Pulmonary/Chest: Effort normal and breath sounds normal. No respiratory distress. She has no wheezes. She has no rales. She exhibits no tenderness. Right breast exhibits no inverted nipple, no mass, no nipple discharge, no skin change and no tenderness. Left breast exhibits no inverted nipple, no mass, no nipple discharge, no skin change and no  tenderness. Breasts are symmetrical.  Abdominal: Soft. Bowel sounds are normal. She exhibits no distension and no mass. There is no tenderness. There is no rebound and no guarding.  Musculoskeletal: Normal range of motion. She exhibits no edema or tenderness.  Lymphadenopathy:    She has no cervical adenopathy.  Neurological: She is alert and oriented to person, place, and time. She has normal reflexes.  Skin: Skin is warm and dry. No rash noted. She is not diaphoretic.  Psychiatric: She has a normal mood and affect. Her behavior is normal. Judgment and thought content normal.  Vitals reviewed.    Depression Screen PHQ 2/9 Scores 08/15/2017  PHQ - 2 Score 0      Assessment & Plan:     Routine Health Maintenance and Physical Exam  Exercise Activities and Dietary recommendations Goals    None      Immunization History  Administered Date(s) Administered  . Influenza,inj,Quad PF,6+ Mos 08/15/2017  . Tdap 04/14/2012  Health Maintenance  Topic Date Due  . HIV Screening  07/11/1973  . INFLUENZA VACCINE  01/05/2018 (Originally 05/08/2017)  . MAMMOGRAM  05/11/2018  . PAP SMEAR  04/27/2021  . TETANUS/TDAP  04/14/2022  . COLONOSCOPY  08/13/2024  . Hepatitis C Screening  Completed     Discussed health benefits of physical activity, and encouraged her to engage in regular exercise appropriate for her age and condition.    1. Annual physical exam Normal physical exam today. Will check labs as below and f/u pending lab results. If labs are stable and WNL she will not need to have these rechecked for one year at her next annual physical exam. She is to call the office in the meantime if she has any acute issue, questions or concerns. - CBC with Differential/Platelet - Comprehensive metabolic panel - Hemoglobin A1c - TSH  2. Hypercholesteremia Stable. Will check labs as below and f/u pending results. - Lipid panel  3. Borderline diabetes Stable. Will check labs as below  and f/u pending results. - Hemoglobin A1c  4. Other chest pain Suspect stress/anxiety. Patient can walk without chest pain. EKG today shows NSR rate of 73 personally reviewed by me.  - EKG 12-Lead  5. Influenza vaccine needed Flu vaccine given today without complication. Patient sat upright for 15 minutes to check for adverse reaction before being released. - Flu Vaccine QUAD 36+ mos IM   Mar Daring, PA-C  [facility]

## 2017-08-17 LAB — COMPLETE METABOLIC PANEL WITH GFR
AG RATIO: 1.4 (calc) (ref 1.0–2.5)
ALT: 51 U/L — ABNORMAL HIGH (ref 6–29)
AST: 27 U/L (ref 10–35)
Albumin: 4.4 g/dL (ref 3.6–5.1)
Alkaline phosphatase (APISO): 85 U/L (ref 33–130)
BILIRUBIN TOTAL: 0.6 mg/dL (ref 0.2–1.2)
BUN: 17 mg/dL (ref 7–25)
CALCIUM: 9.7 mg/dL (ref 8.6–10.4)
CHLORIDE: 101 mmol/L (ref 98–110)
CO2: 29 mmol/L (ref 20–32)
Creat: 0.8 mg/dL (ref 0.50–1.05)
GFR, EST NON AFRICAN AMERICAN: 81 mL/min/{1.73_m2} (ref >=60)
GFR, Est African American: 94 mL/min/{1.73_m2} (ref >=60)
GLOBULIN: 3.2 g/dL (ref 1.9–3.7)
Glucose, Bld: 94 mg/dL (ref 65–99)
POTASSIUM: 4.2 mmol/L (ref 3.5–5.3)
SODIUM: 138 mmol/L (ref 135–146)
Total Protein: 7.6 g/dL (ref 6.1–8.1)

## 2017-08-17 LAB — HEMOGLOBIN A1C
EAG (MMOL/L): 6.6 (calc)
HEMOGLOBIN A1C: 5.8 %{Hb} — AB (ref <5.7)
MEAN PLASMA GLUCOSE: 120 (calc)

## 2017-08-17 LAB — CBC WITH DIFFERENTIAL/PLATELET
Basophils Absolute: 59 cells/uL (ref 0–200)
Basophils Relative: 1 %
Eosinophils Absolute: 83 cells/uL (ref 15–500)
Eosinophils Relative: 1.4 %
HEMATOCRIT: 42 % (ref 35.0–45.0)
Hemoglobin: 14.4 g/dL (ref 11.7–15.5)
Lymphs Abs: 1776 cells/uL (ref 850–3900)
MCH: 29.9 pg (ref 27.0–33.0)
MCHC: 34.3 g/dL (ref 32.0–36.0)
MCV: 87.1 fL (ref 80.0–100.0)
MONOS PCT: 8.5 %
MPV: 9.6 fL (ref 7.5–12.5)
NEUTROS PCT: 59 %
Neutro Abs: 3481 cells/uL (ref 1500–7800)
Platelets: 325 10*3/uL (ref 140–400)
RBC: 4.82 10*6/uL (ref 3.80–5.10)
RDW: 12.9 % (ref 11.0–15.0)
TOTAL LYMPHOCYTE: 30.1 %
WBC mixed population: 502 cells/uL (ref 200–950)
WBC: 5.9 10*3/uL (ref 3.8–10.8)

## 2017-08-17 LAB — LIPID PANEL
CHOL/HDL RATIO: 4.4 (calc) (ref <5.0)
Cholesterol: 272 mg/dL — ABNORMAL HIGH (ref <200)
HDL: 62 mg/dL (ref >50)
LDL CHOLESTEROL (CALC): 171 mg/dL — AB
Non-HDL Cholesterol (Calc): 210 mg/dL (calc) — ABNORMAL HIGH (ref <130)
TRIGLYCERIDES: 216 mg/dL — AB (ref <150)

## 2017-08-17 LAB — TSH: TSH: 1.88 m[IU]/L (ref 0.40–4.50)

## 2017-08-19 ENCOUNTER — Telehealth: Payer: Self-pay

## 2017-08-19 NOTE — Telephone Encounter (Signed)
-----   Message from Mar Daring, [place] sent at 08/19/2017  2:15 PM EST ----- Cholesterol is up from last year but 10 yr ASCVD risk is still low at 5%, meaning you have a 5% risk of having a cardiovascular event in the next 10 years. At greater than 7.5% we normally want to add medications to help lower cholesterol, so no need to add medications at this time. However, HDL (good) cholesterol has increased to 62 which offers cardioprotection as well. A1c up to 5.8 from 5.5. Also ALT (one factor of liver function) is up at 77, where it had been normal. This can be due to pain medications such as tylenol you may be using for your hip. Would recommend to try to limit tylenol products, fatty foods and alcohol. Can recheck LFTs in 6-8 weeks. Continue working on healthy dietary changes as well and once able with the hip start back exercising as tolerated.

## 2017-08-19 NOTE — Telephone Encounter (Signed)
Patient advised as directed below.  Thanks,  -Sylva Overley 

## 2017-09-02 DIAGNOSIS — M5417 Radiculopathy, lumbosacral region: Secondary | ICD-10-CM | POA: Insufficient documentation

## 2017-09-05 ENCOUNTER — Ambulatory Visit
Admission: RE | Admit: 2017-09-05 | Discharge: 2017-09-05 | Disposition: A | Discharge status: Home / Self Care | Payer: Self-pay | Source: Ambulatory Visit | Attending: Specialist | Admitting: Specialist

## 2017-09-05 ENCOUNTER — Other Ambulatory Visit: Payer: Self-pay | Admitting: Specialist

## 2017-09-05 DIAGNOSIS — R52 Pain, unspecified: Secondary | ICD-10-CM

## 2017-09-06 ENCOUNTER — Ambulatory Visit
Admission: RE | Admit: 2017-09-06 | Discharge: 2017-09-06 | Disposition: A | Discharge status: Home / Self Care | Payer: PRIVATE HEALTH INSURANCE | Source: Ambulatory Visit | Attending: Specialist | Admitting: Specialist

## 2017-09-06 DIAGNOSIS — M5126 Other intervertebral disc displacement, lumbar region: Secondary | ICD-10-CM

## 2017-09-06 MED ORDER — ONDANSETRON HCL 4 MG/2ML IJ SOLN: 4.0000 mg | Freq: Four times a day (QID) | INTRAMUSCULAR | Status: DC | PRN

## 2017-09-06 MED ORDER — MEPERIDINE HCL 100 MG/ML IJ SOLN
75.0000 mg | Freq: Once | INTRAMUSCULAR | Status: AC
  Administered 2017-09-06: 75 mg via INTRAMUSCULAR

## 2017-09-06 MED ORDER — DIAZEPAM 5 MG PO TABS
10.0000 mg | ORAL_TABLET | Freq: Once | ORAL | Status: AC
  Administered 2017-09-06: 10 mg via ORAL

## 2017-09-06 MED ORDER — ONDANSETRON HCL 4 MG/2ML IJ SOLN
4.0000 mg | Freq: Once | INTRAMUSCULAR | Status: AC
  Administered 2017-09-06: 4 mg via INTRAMUSCULAR

## 2017-09-06 MED ORDER — IOPAMIDOL (ISOVUE-M 200) INJECTION 41%
15.0000 mL | Freq: Once | INTRAMUSCULAR | Status: AC
  Administered 2017-09-06: 15 mL via INTRATHECAL

## 2017-09-06 NOTE — Discharge Instructions (Signed)

## 2017-09-23 NOTE — Progress Notes (Signed)
Need orders in epic for 1-9 surgery asap

## 2017-09-24 ENCOUNTER — Ambulatory Visit: Payer: Self-pay | Admitting: Orthopedic Surgery

## 2017-09-24 NOTE — H&P (View-Only) (Signed)
Debra [name] is an 61 y.o. female.   Chief Complaint: back and left leg pain HPI: The patient is a 61 year old female who presents today for follow up of their back. The patient is being followed for their back pain. They are now 8 1/2 months out from when symptoms began. Symptoms reported today include: pain. Current treatment includes: activity modification. The following medication has been used for pain control: none. The patient reports their current pain level to be 4 / 10. The patient presents today following CT/Myelogram.  Note:Patient follows up with her CT myelogram results and her EMG and nerve conduction studies.  She continues to report pain mainly in the buttock leg and into the foot top and outer aspect of the foot.  She reports the pain is disabling.  No past medical history on file.  Past Surgical History:  Procedure Laterality Date  . ABDOMINAL HYSTERECTOMY  1998  . RHINOPLASTY  1988  . SHOULDER SURGERY Right 2011    Family History  Adopted: Yes  Problem Relation Age of Onset  . Breast cancer Mother 84  . Alcohol abuse Mother   . Hyperlipidemia Sister   . Hyperlipidemia Brother   . Hyperlipidemia Daughter    Social History:  reports that she has quit smoking. she has never used smokeless tobacco. She reports that she drinks alcohol. She reports that she does not use drugs.  Allergies:  Allergies  Allergen Reactions  . Other Itching    Seasonal   . Pravastatin Sodium     Other reaction(s): Joint Pains     (Not in a hospital admission)  No results found for this or any previous visit (from the past 48 hour(s)). No results found.  Review of Systems  Constitutional: Negative.   HENT: Negative.   Eyes: Negative.   Respiratory: Negative.   Cardiovascular: Negative.   Gastrointestinal: Negative.   Genitourinary: Negative.   Musculoskeletal: Positive for back pain.  Skin: Negative.   Neurological: Positive for sensory change and focal weakness.   Psychiatric/Behavioral: Negative.     There were no vitals taken for this visit. Physical Exam  Constitutional: She is oriented to person, place, and time. She appears well-developed.  HENT:  Head: Normocephalic.  Eyes: Pupils are equal, round, and reactive to light.  Neck: Normal range of motion.  Cardiovascular: Normal rate.  Respiratory: Effort normal.  GI: Soft.  Musculoskeletal:  Patient my distress mood affect is appropriate. Straight leg raise buttock thigh calf pain and pain to the a type of the foot. EHLs 4 5 on the left compared to the right. Diminished Achilles reflex on the left. Diminished repetitive plantar flexion. Altered sensation S1 dermatome. She has mild back pain with extension. No instability hips knees and ankles. Pelvis is stable abdomen soft nontender thoracic. Pence range of motion cervical spine. Motor is 5/5 in the upper extremities. No DVT.   Neurological: She is alert and oriented to person, place, and time.    EMG and nerve conduction study is essentially normal.  CT myelogram demonstrates compression of the L5 nerve root in the lateral recess. She has facet arthropathy. Also displacement of the S1 nerve root at L5-S1 due to a disc protrusion.  Her facets are favorably oriented. There is not a wide open facet joint with inflammation. Or fluid collection.There is disc degeneration noted at multiple levels. On flexion-extension there is a minimal offset at 4 5 with no change in flexion-extension.  Assessment/Plan HNP/stenosis  Patient demonstrates symptoms  consistent with the L5 and S1 1 nerve root compression. She indicates her symptoms are intolerable. She is tried activity modification injections analgesics with persistent symptoms. Options include pain management or proceeding with minimally invasive surgery at this point. We discussed decompression at L4-5 and L5-S1 given the S1 nerve root symptoms and the results of the CT myelogram.  The  limited decompression of the lateral recess at 4 5 would allow for decompression of the 5 root without destabilizing the motion segment. She is a fairly tall disc at 4 5 and the facets are favorably oriented with a ligamentum flavum hypertrophy and a bony osteophyte. There is no instability in flexion extension. She has minimal to no back pain predominantly leg pain.  We discussed risks and benefits bleeding infection damage to neurovascular structures. No changes in her symptoms worsening symptoms DVT PE anesthetic complications etc.  Also that this operation is designed to make more room for the nerve but not to fix the disks which will still be degenerated she will still have arthritis in the facet joints and that may return in the future in terms of stenosis or instability requiring fusion.  Cecilie Kicks., PA-C for Dr. Tonita Cong 09/24/2017, 8:25 AM

## 2017-09-24 NOTE — H&P (Signed)
Debra [name] is an 61 y.o. female.   Chief Complaint: back and left leg pain HPI: The patient is a 61 year old female who presents today for follow up of their back. The patient is being followed for their back pain. They are now 8 1/2 months out from when symptoms began. Symptoms reported today include: pain. Current treatment includes: activity modification. The following medication has been used for pain control: none. The patient reports their current pain level to be 4 / 10. The patient presents today following CT/Myelogram.  Note:Patient follows up with her CT myelogram results and her EMG and nerve conduction studies.  She continues to report pain mainly in the buttock leg and into the foot top and outer aspect of the foot.  She reports the pain is disabling.  No past medical history on file.  Past Surgical History:  Procedure Laterality Date  . ABDOMINAL HYSTERECTOMY  1998  . RHINOPLASTY  1988  . SHOULDER SURGERY Right 2011    Family History  Adopted: Yes  Problem Relation Age of Onset  . Breast cancer Mother 95  . Alcohol abuse Mother   . Hyperlipidemia Sister   . Hyperlipidemia Brother   . Hyperlipidemia Daughter    Social History:  reports that she has quit smoking. she has never used smokeless tobacco. She reports that she drinks alcohol. She reports that she does not use drugs.  Allergies:  Allergies  Allergen Reactions  . Other Itching    Seasonal   . Pravastatin Sodium     Other reaction(s): Joint Pains     (Not in a hospital admission)  No results found for this or any previous visit (from the past 48 hour(s)). No results found.  Review of Systems  Constitutional: Negative.   HENT: Negative.   Eyes: Negative.   Respiratory: Negative.   Cardiovascular: Negative.   Gastrointestinal: Negative.   Genitourinary: Negative.   Musculoskeletal: Positive for back pain.  Skin: Negative.   Neurological: Positive for sensory change and focal weakness.   Psychiatric/Behavioral: Negative.     There were no vitals taken for this visit. Physical Exam  Constitutional: She is oriented to person, place, and time. She appears well-developed.  HENT:  Head: Normocephalic.  Eyes: Pupils are equal, round, and reactive to light.  Neck: Normal range of motion.  Cardiovascular: Normal rate.  Respiratory: Effort normal.  GI: Soft.  Musculoskeletal:  Patient my distress mood affect is appropriate. Straight leg raise buttock thigh calf pain and pain to the a type of the foot. EHLs 4 5 on the left compared to the right. Diminished Achilles reflex on the left. Diminished repetitive plantar flexion. Altered sensation S1 dermatome. She has mild back pain with extension. No instability hips knees and ankles. Pelvis is stable abdomen soft nontender thoracic. Pence range of motion cervical spine. Motor is 5/5 in the upper extremities. No DVT.   Neurological: She is alert and oriented to person, place, and time.    EMG and nerve conduction study is essentially normal.  CT myelogram demonstrates compression of the L5 nerve root in the lateral recess. She has facet arthropathy. Also displacement of the S1 nerve root at L5-S1 due to a disc protrusion.  Her facets are favorably oriented. There is not a wide open facet joint with inflammation. Or fluid collection.There is disc degeneration noted at multiple levels. On flexion-extension there is a minimal offset at 4 5 with no change in flexion-extension.  Assessment/Plan HNP/stenosis  Patient demonstrates symptoms  consistent with the L5 and S1 1 nerve root compression. She indicates her symptoms are intolerable. She is tried activity modification injections analgesics with persistent symptoms. Options include pain management or proceeding with minimally invasive surgery at this point. We discussed decompression at L4-5 and L5-S1 given the S1 nerve root symptoms and the results of the CT myelogram.  The  limited decompression of the lateral recess at 4 5 would allow for decompression of the 5 root without destabilizing the motion segment. She is a fairly tall disc at 4 5 and the facets are favorably oriented with a ligamentum flavum hypertrophy and a bony osteophyte. There is no instability in flexion extension. She has minimal to no back pain predominantly leg pain.  We discussed risks and benefits bleeding infection damage to neurovascular structures. No changes in her symptoms worsening symptoms DVT PE anesthetic complications etc.  Also that this operation is designed to make more room for the nerve but not to fix the disks which will still be degenerated she will still have arthritis in the facet joints and that may return in the future in terms of stenosis or instability requiring fusion.  Cecilie Kicks., PA-C for Dr. Tonita Cong 09/24/2017, 8:25 AM

## 2017-10-09 ENCOUNTER — Other Ambulatory Visit (HOSPITAL_COMMUNITY): Payer: Self-pay | Admitting: *Deleted

## 2017-10-09 NOTE — Progress Notes (Signed)
EKG 08-15-17 EPIC

## 2017-10-09 NOTE — Patient Instructions (Signed)
Debra [name]  10/09/2017   Your procedure is scheduled on: 10-16-17  Report to [facility] Main  Entrance    Report to admitting at 6:30AM   Call this number if you have problems the morning of surgery [phone]   Remember: ONLY 1 PERSON MAY GO WITH YOU TO SHORT STAY TO GET  READY MORNING OF YOUR SURGERY.  Do not eat food or drink liquids :After Midnight.     Take these medicines the morning of surgery with A SIP OF WATER: NONE                                 You may not have any metal on your body including hair pins and              piercings  Do not wear jewelry, make-up, lotions, powders or perfumes, deodorant             Do not wear nail polish.  Do not shave  48 hours prior to surgery.                Do not bring valuables to the hospital. [place].  Contacts, dentures or bridgework may not be worn into surgery.  Leave suitcase in the car. After surgery it may be brought to your room.                Please read over the following fact sheets you were given: _____________________________________________________________________            [facility] - Preparing for Surgery Before surgery, you can play an important role.  Because skin is not sterile, your skin needs to be as free of germs as possible.  You can reduce the number of germs on your skin by washing with CHG (chlorahexidine gluconate) soap before surgery.  CHG is an antiseptic cleaner which kills germs and bonds with the skin to continue killing germs even after washing. Please DO NOT use if you have an allergy to CHG or antibacterial soaps.  If your skin becomes reddened/irritated stop using the CHG and inform your nurse when you arrive at Short Stay. Do not shave (including legs and underarms) for at least 48 hours prior to the first CHG shower.  You may shave your face/neck. Please follow these instructions carefully:  1.  Shower with  CHG Soap the night before surgery and the  morning of Surgery.  2.  If you choose to wash your hair, wash your hair first as usual with your  normal  shampoo.  3.  After you shampoo, rinse your hair and body thoroughly to remove the  shampoo.                           4.  Use CHG as you would any other liquid soap.  You can apply chg directly  to the skin and wash                       Gently with a scrungie or clean washcloth.  5.  Apply the CHG Soap to your body ONLY FROM THE NECK DOWN.   Do not use on  face/ open                           Wound or open sores. Avoid contact with eyes, ears mouth and genitals (private parts).                       Wash face,  Genitals (private parts) with your normal soap.             6.  Wash thoroughly, paying special attention to the area where your surgery  will be performed.  7.  Thoroughly rinse your body with warm water from the neck down.  8.  DO NOT shower/wash with your normal soap after using and rinsing off  the CHG Soap.                9.  Pat yourself dry with a clean towel.            10.  Wear clean pajamas.            11.  Place clean sheets on your bed the night of your first shower and do not  sleep with pets. Day of Surgery : Do not apply any lotions/deodorants the morning of surgery.  Please wear clean clothes to the hospital/surgery center.  FAILURE TO FOLLOW THESE INSTRUCTIONS MAY RESULT IN THE CANCELLATION OF YOUR SURGERY PATIENT SIGNATURE_________________________________  NURSE SIGNATURE__________________________________  ________________________________________________________________________   [name]  An incentive spirometer is a tool that can help keep your lungs clear and active. This tool measures how well you are filling your lungs with each breath. Taking long deep breaths may help reverse or decrease the chance of developing breathing (pulmonary) problems (especially infection) following:  A long period of  time when you are unable to move or be active. BEFORE THE PROCEDURE   If the spirometer includes an indicator to show your best effort, your nurse or respiratory therapist will set it to a desired goal.  If possible, sit up straight or lean slightly forward. Try not to slouch.  Hold the incentive spirometer in an upright position. INSTRUCTIONS FOR USE  1. Sit on the edge of your bed if possible, or sit up as far as you can in bed or on a chair. 2. Hold the incentive spirometer in an upright position. 3. Breathe out normally. 4. Place the mouthpiece in your mouth and seal your lips tightly around it. 5. Breathe in slowly and as deeply as possible, raising the piston or the ball toward the top of the column. 6. Hold your breath for 3-5 seconds or for as long as possible. Allow the piston or ball to fall to the bottom of the column. 7. Remove the mouthpiece from your mouth and breathe out normally. 8. Rest for a few seconds and repeat Steps 1 through 7 at least 10 times every 1-2 hours when you are awake. Take your time and take a few normal breaths between deep breaths. 9. The spirometer may include an indicator to show your best effort. Use the indicator as a goal to work toward during each repetition. 10. After each set of 10 deep breaths, practice coughing to be sure your lungs are clear. If you have an incision (the cut made at the time of surgery), support your incision when coughing by placing a pillow or rolled up towels firmly against it. Once you are able to get out of bed, walk around indoors  and cough well. You may stop using the incentive spirometer when instructed by your caregiver.  RISKS AND COMPLICATIONS  Take your time so you do not get dizzy or light-headed.  If you are in pain, you may need to take or ask for pain medication before doing incentive spirometry. It is harder to take a deep breath if you are having pain. AFTER USE  Rest and breathe slowly and easily.  It can  be helpful to keep track of a log of your progress. Your caregiver can provide you with a simple table to help with this. If you are using the spirometer at home, follow these instructions: [place] IF:   You are having difficultly using the spirometer.  You have trouble using the spirometer as often as instructed.  Your pain medication is not giving enough relief while using the spirometer.  You develop fever of 100.5 F (38.1 C) or higher. SEEK IMMEDIATE MEDICAL CARE IF:   You cough up bloody sputum that had not been present before.  You develop fever of 102 F (38.9 C) or greater.  You develop worsening pain at or near the incision site. MAKE SURE YOU:   Understand these instructions.  Will watch your condition.  Will get help right away if you are not doing well or get worse. Document Released: 02/04/2007 Document Revised: 12/17/2011 Document Reviewed: 04/07/2007 [facility] Patient Information 2014 [place], [place].   ________________________________________________________________________

## 2017-10-11 ENCOUNTER — Other Ambulatory Visit: Payer: Self-pay

## 2017-10-11 ENCOUNTER — Encounter (INDEPENDENT_AMBULATORY_CARE_PROVIDER_SITE_OTHER): Payer: Self-pay

## 2017-10-11 ENCOUNTER — Ambulatory Visit (HOSPITAL_COMMUNITY)
Admission: RE | Admit: 2017-10-11 | Discharge: 2017-10-11 | Disposition: A | Discharge status: Home / Self Care | Payer: 59 | Source: Ambulatory Visit | Attending: Orthopedic Surgery | Admitting: Orthopedic Surgery

## 2017-10-11 ENCOUNTER — Encounter (HOSPITAL_COMMUNITY): Payer: Self-pay

## 2017-10-11 ENCOUNTER — Encounter (HOSPITAL_COMMUNITY)
Admission: RE | Admit: 2017-10-11 | Discharge: 2017-10-11 | Disposition: A | Discharge status: Home / Self Care | Payer: 59 | Source: Ambulatory Visit | Attending: Specialist | Admitting: Specialist

## 2017-10-11 DIAGNOSIS — M5126 Other intervertebral disc displacement, lumbar region: Secondary | ICD-10-CM | POA: Diagnosis not present

## 2017-10-11 DIAGNOSIS — Z01818 Encounter for other preprocedural examination: Secondary | ICD-10-CM | POA: Diagnosis not present

## 2017-10-11 DIAGNOSIS — Z01812 Encounter for preprocedural laboratory examination: Secondary | ICD-10-CM | POA: Diagnosis present

## 2017-10-11 HISTORY — DX: Low back pain, unspecified: M54.50

## 2017-10-11 HISTORY — DX: Prediabetes: R73.03

## 2017-10-11 HISTORY — DX: Low back pain: M54.5

## 2017-10-11 LAB — CBC
HEMATOCRIT: 42 % (ref 36.0–46.0)
Hemoglobin: 14.4 g/dL (ref 12.0–15.0)
MCH: 30.6 pg (ref 26.0–34.0)
MCHC: 34.3 g/dL (ref 30.0–36.0)
MCV: 89.2 fL (ref 78.0–100.0)
Platelets: 369 10*3/uL (ref 150–400)
RBC: 4.71 MIL/uL (ref 3.87–5.11)
RDW: 12.8 % (ref 11.5–15.5)
WBC: 8 10*3/uL (ref 4.0–10.5)

## 2017-10-11 LAB — BASIC METABOLIC PANEL
ANION GAP: 7 (ref 5–15)
BUN: 20 mg/dL (ref 6–20)
CALCIUM: 9.5 mg/dL (ref 8.9–10.3)
CO2: 26 mmol/L (ref 22–32)
Chloride: 106 mmol/L (ref 101–111)
Creatinine, Ser: 0.69 mg/dL (ref 0.44–1.00)
GFR calc Af Amer: >60 mL/min (ref >60)
Glucose, Bld: 111 mg/dL — ABNORMAL HIGH (ref 65–99)
POTASSIUM: 4.4 mmol/L (ref 3.5–5.1)
SODIUM: 139 mmol/L (ref 135–145)

## 2017-10-11 LAB — SURGICAL PCR SCREEN
MRSA, PCR: NEGATIVE
STAPHYLOCOCCUS AUREUS: NEGATIVE

## 2017-10-11 LAB — HEMOGLOBIN A1C
Hgb A1c MFr Bld: 5.6 % (ref 4.8–5.6)
MEAN PLASMA GLUCOSE: 114.02 mg/dL

## 2017-10-15 ENCOUNTER — Encounter (HOSPITAL_COMMUNITY): Payer: Self-pay | Admitting: Certified Registered Nurse Anesthetist

## 2017-10-15 NOTE — Anesthesia Preprocedure Evaluation (Addendum)
Anesthesia Evaluation  Patient identified by MRN, date of birth, ID band Patient awake    Reviewed: Allergy & Precautions, NPO status , Patient's Chart, lab work & pertinent test results  Airway Mallampati: IV  TM Distance: >3 FB Neck ROM: Full  Mouth opening: Limited Mouth Opening  Dental  (+) Dental Advisory Given   Pulmonary former smoker,    breath sounds clear to auscultation       Cardiovascular negative cardio ROS   Rhythm:Regular Rate:Normal     Neuro/Psych  Headaches,    GI/Hepatic negative GI ROS, Neg liver ROS,   Endo/Other  negative endocrine ROS  Renal/GU negative Renal ROS     Musculoskeletal   Abdominal   Peds  Hematology negative hematology ROS (+)   Anesthesia Other Findings   Reproductive/Obstetrics                            Lab Results  Component Value Date   WBC 8.0 10/11/2017   HGB 14.4 10/11/2017   HCT 42.0 10/11/2017   MCV 89.2 10/11/2017   PLT 369 10/11/2017   Lab Results  Component Value Date   CREATININE 0.69 10/11/2017   BUN 20 10/11/2017   NA 139 10/11/2017   K 4.4 10/11/2017   CL 106 10/11/2017   CO2 26 10/11/2017    Anesthesia Physical Anesthesia Plan  ASA: II  Anesthesia Plan: General   Post-op Pain Management:    Induction: Intravenous  PONV Risk Score and Plan: 3 and Dexamethasone, Ondansetron, Treatment may vary due to age or medical condition and Midazolam  Airway Management Planned: Oral ETT and Video Laryngoscope Planned  Additional Equipment:   Intra-op Plan:   Post-operative Plan: Extubation in OR  Informed Consent: I have reviewed the patients History and Physical, chart, labs and discussed the procedure including the risks, benefits and alternatives for the proposed anesthesia with the patient or authorized representative who has indicated his/her understanding and acceptance.     Plan Discussed with:  CRNA  Anesthesia Plan Comments:       Anesthesia Quick Evaluation

## 2017-10-16 ENCOUNTER — Ambulatory Visit (HOSPITAL_COMMUNITY): Payer: 59

## 2017-10-16 ENCOUNTER — Encounter (HOSPITAL_COMMUNITY)
Admission: RE | Disposition: A | Discharge status: Home / Self Care | Payer: Self-pay | Source: Ambulatory Visit | Attending: Specialist

## 2017-10-16 ENCOUNTER — Ambulatory Visit (HOSPITAL_COMMUNITY)
Admission: RE | Admit: 2017-10-16 | Discharge: 2017-10-17 | Disposition: A | Discharge status: Home / Self Care | Payer: 59 | Source: Ambulatory Visit | Attending: Specialist | Admitting: Specialist

## 2017-10-16 ENCOUNTER — Other Ambulatory Visit: Payer: Self-pay

## 2017-10-16 ENCOUNTER — Ambulatory Visit (HOSPITAL_COMMUNITY): Payer: 59 | Admitting: Certified Registered Nurse Anesthetist

## 2017-10-16 ENCOUNTER — Ambulatory Visit: Payer: Self-pay | Admitting: Orthopedic Surgery

## 2017-10-16 ENCOUNTER — Encounter (HOSPITAL_COMMUNITY): Payer: Self-pay | Admitting: *Deleted

## 2017-10-16 DIAGNOSIS — M5116 Intervertebral disc disorders with radiculopathy, lumbar region: Secondary | ICD-10-CM | POA: Diagnosis not present

## 2017-10-16 DIAGNOSIS — Z90710 Acquired absence of both cervix and uterus: Secondary | ICD-10-CM | POA: Diagnosis not present

## 2017-10-16 DIAGNOSIS — Z811 Family history of alcohol abuse and dependence: Secondary | ICD-10-CM | POA: Insufficient documentation

## 2017-10-16 DIAGNOSIS — R7303 Prediabetes: Secondary | ICD-10-CM | POA: Insufficient documentation

## 2017-10-16 DIAGNOSIS — M48061 Spinal stenosis, lumbar region without neurogenic claudication: Secondary | ICD-10-CM | POA: Diagnosis present

## 2017-10-16 DIAGNOSIS — Z8249 Family history of ischemic heart disease and other diseases of the circulatory system: Secondary | ICD-10-CM | POA: Insufficient documentation

## 2017-10-16 DIAGNOSIS — Z87891 Personal history of nicotine dependence: Secondary | ICD-10-CM | POA: Diagnosis not present

## 2017-10-16 DIAGNOSIS — Z803 Family history of malignant neoplasm of breast: Secondary | ICD-10-CM | POA: Insufficient documentation

## 2017-10-16 DIAGNOSIS — Z79899 Other long term (current) drug therapy: Secondary | ICD-10-CM | POA: Insufficient documentation

## 2017-10-16 DIAGNOSIS — Z888 Allergy status to other drugs, medicaments and biological substances status: Secondary | ICD-10-CM | POA: Diagnosis not present

## 2017-10-16 DIAGNOSIS — R51 Headache: Secondary | ICD-10-CM | POA: Diagnosis not present

## 2017-10-16 DIAGNOSIS — M4807 Spinal stenosis, lumbosacral region: Secondary | ICD-10-CM | POA: Diagnosis present

## 2017-10-16 DIAGNOSIS — M5126 Other intervertebral disc displacement, lumbar region: Secondary | ICD-10-CM

## 2017-10-16 DIAGNOSIS — Z419 Encounter for procedure for purposes other than remedying health state, unspecified: Secondary | ICD-10-CM

## 2017-10-16 HISTORY — PX: LUMBAR LAMINECTOMY/DECOMPRESSION MICRODISCECTOMY: SHX5026

## 2017-10-16 SURGERY — LUMBAR LAMINECTOMY/DECOMPRESSION MICRODISCECTOMY 2 LEVELS
Anesthesia: General | Laterality: Left

## 2017-10-16 MED ORDER — ONDANSETRON HCL 4 MG PO TABS: 4.0000 mg | ORAL_TABLET | Freq: Four times a day (QID) | ORAL | Status: DC | PRN

## 2017-10-16 MED ORDER — DEXAMETHASONE SODIUM PHOSPHATE 4 MG/ML IJ SOLN
INTRAMUSCULAR | Status: DC | PRN
  Administered 2017-10-16: 10 mg via INTRAVENOUS

## 2017-10-16 MED ORDER — POLYMYXIN B SULFATE 500000 UNITS IJ SOLR
INTRAMUSCULAR | Status: AC
  Filled 2017-10-16: qty 500000

## 2017-10-16 MED ORDER — MENTHOL 3 MG MT LOZG: 1.0000 | LOZENGE | OROMUCOSAL | Status: DC | PRN

## 2017-10-16 MED ORDER — BUPIVACAINE-EPINEPHRINE (PF) 0.5% -1:200000 IJ SOLN
INTRAMUSCULAR | Status: AC
  Filled 2017-10-16: qty 30

## 2017-10-16 MED ORDER — ONDANSETRON HCL 4 MG/2ML IJ SOLN
INTRAMUSCULAR | Status: AC
  Filled 2017-10-16: qty 2

## 2017-10-16 MED ORDER — PHENYLEPHRINE 40 MCG/ML (10ML) SYRINGE FOR IV PUSH (FOR BLOOD PRESSURE SUPPORT)
PREFILLED_SYRINGE | INTRAVENOUS | Status: AC
  Filled 2017-10-16: qty 10

## 2017-10-16 MED ORDER — ROCURONIUM BROMIDE 10 MG/ML (PF) SYRINGE
PREFILLED_SYRINGE | INTRAVENOUS | Status: DC | PRN
  Administered 2017-10-16: 50 mg via INTRAVENOUS
  Administered 2017-10-16 (×2): 10 mg via INTRAVENOUS
  Administered 2017-10-16: 5 mg via INTRAVENOUS

## 2017-10-16 MED ORDER — HYDROMORPHONE HCL 1 MG/ML IJ SOLN: 0.2500 mg | INTRAMUSCULAR | Status: DC | PRN

## 2017-10-16 MED ORDER — FENTANYL CITRATE (PF) 100 MCG/2ML IJ SOLN
INTRAMUSCULAR | Status: AC
  Filled 2017-10-16: qty 2

## 2017-10-16 MED ORDER — METHOCARBAMOL 500 MG PO TABS: 500.0000 mg | ORAL_TABLET | Freq: Four times a day (QID) | ORAL | 1 refills | Status: DC | PRN

## 2017-10-16 MED ORDER — METHOCARBAMOL 1000 MG/10ML IJ SOLN
500.0000 mg | Freq: Four times a day (QID) | INTRAVENOUS | Status: DC | PRN
  Administered 2017-10-16: 500 mg via INTRAVENOUS
  Filled 2017-10-16: qty 550

## 2017-10-16 MED ORDER — LIDOCAINE 2% (20 MG/ML) 5 ML SYRINGE
INTRAMUSCULAR | Status: AC
  Filled 2017-10-16: qty 5

## 2017-10-16 MED ORDER — ONDANSETRON HCL 4 MG/2ML IJ SOLN
INTRAMUSCULAR | Status: DC | PRN
  Administered 2017-10-16: 4 mg via INTRAVENOUS

## 2017-10-16 MED ORDER — MIDAZOLAM HCL 5 MG/5ML IJ SOLN
INTRAMUSCULAR | Status: DC | PRN
  Administered 2017-10-16: 2 mg via INTRAVENOUS

## 2017-10-16 MED ORDER — DEXAMETHASONE SODIUM PHOSPHATE 10 MG/ML IJ SOLN
INTRAMUSCULAR | Status: AC
  Filled 2017-10-16: qty 1

## 2017-10-16 MED ORDER — DOCUSATE SODIUM 100 MG PO CAPS: 100.0000 mg | ORAL_CAPSULE | Freq: Two times a day (BID) | ORAL | 2 refills | Status: DC

## 2017-10-16 MED ORDER — THROMBIN (RECOMBINANT) 5000 UNITS EX SOLR
CUTANEOUS | Status: DC | PRN
  Administered 2017-10-16 (×2): 5000 [IU] via TOPICAL

## 2017-10-16 MED ORDER — PHENOL 1.4 % MT LIQD
1.0000 | OROMUCOSAL | Status: DC | PRN
  Administered 2017-10-16: 22:00:00 1 via OROMUCOSAL
  Filled 2017-10-16: qty 177

## 2017-10-16 MED ORDER — LACTATED RINGERS IV SOLN
INTRAVENOUS | Status: DC
  Administered 2017-10-16 (×2): via INTRAVENOUS

## 2017-10-16 MED ORDER — OXYCODONE-ACETAMINOPHEN 5-325 MG PO TABS
1.0000 | ORAL_TABLET | ORAL | Status: DC | PRN
  Administered 2017-10-16: 14:00:00 1 via ORAL
  Administered 2017-10-17 (×2): 2 via ORAL
  Filled 2017-10-16 (×3): qty 2

## 2017-10-16 MED ORDER — ACETAMINOPHEN 10 MG/ML IV SOLN
1000.0000 mg | INTRAVENOUS | Status: AC
  Administered 2017-10-16: 1000 mg via INTRAVENOUS
  Filled 2017-10-16: qty 100

## 2017-10-16 MED ORDER — ALUM & MAG HYDROXIDE-SIMETH 200-200-20 MG/5ML PO SUSP: 30.0000 mL | Freq: Four times a day (QID) | ORAL | Status: DC | PRN

## 2017-10-16 MED ORDER — CEFAZOLIN SODIUM-DEXTROSE 2-4 GM/100ML-% IV SOLN
2.0000 g | INTRAVENOUS | Status: AC
  Administered 2017-10-16: 2 g via INTRAVENOUS
  Filled 2017-10-16: qty 100

## 2017-10-16 MED ORDER — BUPIVACAINE-EPINEPHRINE (PF) 0.5% -1:200000 IJ SOLN
INTRAMUSCULAR | Status: DC | PRN
  Administered 2017-10-16: 7 mL via PERINEURAL
  Administered 2017-10-16: 30 mL via PERINEURAL

## 2017-10-16 MED ORDER — SCOPOLAMINE 1 MG/3DAYS TD PT72
MEDICATED_PATCH | TRANSDERMAL | Status: AC
  Filled 2017-10-16: qty 1

## 2017-10-16 MED ORDER — ROCURONIUM BROMIDE 50 MG/5ML IV SOSY
PREFILLED_SYRINGE | INTRAVENOUS | Status: AC
  Filled 2017-10-16: qty 5

## 2017-10-16 MED ORDER — POLYETHYLENE GLYCOL 3350 17 G PO PACK: 17.0000 g | PACK | Freq: Every day | ORAL | Status: DC | PRN

## 2017-10-16 MED ORDER — POLYETHYLENE GLYCOL 3350 17 G PO PACK: 17.0000 g | PACK | Freq: Every day | ORAL | 0 refills | Status: DC

## 2017-10-16 MED ORDER — BISACODYL 5 MG PO TBEC: 5.0000 mg | DELAYED_RELEASE_TABLET | Freq: Every day | ORAL | Status: DC | PRN

## 2017-10-16 MED ORDER — ONDANSETRON HCL 4 MG/2ML IJ SOLN: 4.0000 mg | Freq: Four times a day (QID) | INTRAMUSCULAR | Status: DC | PRN

## 2017-10-16 MED ORDER — PHENYLEPHRINE 40 MCG/ML (10ML) SYRINGE FOR IV PUSH (FOR BLOOD PRESSURE SUPPORT)
PREFILLED_SYRINGE | INTRAVENOUS | Status: DC | PRN
  Administered 2017-10-16: 40 ug via INTRAVENOUS

## 2017-10-16 MED ORDER — MAGNESIUM CITRATE PO SOLN: 1.0000 | Freq: Once | ORAL | Status: DC | PRN

## 2017-10-16 MED ORDER — METHOCARBAMOL 500 MG PO TABS: 500.0000 mg | ORAL_TABLET | Freq: Four times a day (QID) | ORAL | Status: DC | PRN

## 2017-10-16 MED ORDER — GLYCOPYRROLATE 0.2 MG/ML IV SOSY
PREFILLED_SYRINGE | INTRAVENOUS | Status: AC
  Filled 2017-10-16: qty 3

## 2017-10-16 MED ORDER — SCOPOLAMINE 1 MG/3DAYS TD PT72
MEDICATED_PATCH | TRANSDERMAL | Status: DC | PRN
  Administered 2017-10-16: 1 via TRANSDERMAL

## 2017-10-16 MED ORDER — PROPOFOL 10 MG/ML IV BOLUS
INTRAVENOUS | Status: AC
  Filled 2017-10-16: qty 40

## 2017-10-16 MED ORDER — DOCUSATE SODIUM 100 MG PO CAPS
100.0000 mg | ORAL_CAPSULE | Freq: Two times a day (BID) | ORAL | Status: DC
  Administered 2017-10-16 – 2017-10-17 (×2): 100 mg via ORAL
  Filled 2017-10-16 (×2): qty 1

## 2017-10-16 MED ORDER — FENTANYL CITRATE (PF) 100 MCG/2ML IJ SOLN
INTRAMUSCULAR | Status: DC | PRN
  Administered 2017-10-16 (×2): 50 ug via INTRAVENOUS
  Administered 2017-10-16: 100 ug via INTRAVENOUS
  Administered 2017-10-16 (×2): 50 ug via INTRAVENOUS

## 2017-10-16 MED ORDER — SUGAMMADEX SODIUM 200 MG/2ML IV SOLN
INTRAVENOUS | Status: AC
  Filled 2017-10-16: qty 2

## 2017-10-16 MED ORDER — LIDOCAINE 2% (20 MG/ML) 5 ML SYRINGE
INTRAMUSCULAR | Status: DC | PRN
  Administered 2017-10-16: 60 mg via INTRAVENOUS

## 2017-10-16 MED ORDER — RISAQUAD PO CAPS
1.0000 | ORAL_CAPSULE | Freq: Every day | ORAL | Status: DC
  Administered 2017-10-17: 08:00:00 1 via ORAL
  Filled 2017-10-16: qty 1

## 2017-10-16 MED ORDER — KCL IN DEXTROSE-NACL 20-5-0.45 MEQ/L-%-% IV SOLN
INTRAVENOUS | Status: DC
  Administered 2017-10-16: 14:00:00 via INTRAVENOUS
  Filled 2017-10-16 (×2): qty 1000

## 2017-10-16 MED ORDER — HYDROMORPHONE HCL 1 MG/ML IJ SOLN: 1.0000 mg | INTRAMUSCULAR | Status: DC | PRN

## 2017-10-16 MED ORDER — MIDAZOLAM HCL 2 MG/2ML IJ SOLN
INTRAMUSCULAR | Status: AC
  Filled 2017-10-16: qty 2

## 2017-10-16 MED ORDER — OXYCODONE-ACETAMINOPHEN 5-325 MG PO TABS: 1.0000 | ORAL_TABLET | ORAL | 0 refills | Status: DC | PRN

## 2017-10-16 MED ORDER — CEFAZOLIN SODIUM-DEXTROSE 2-4 GM/100ML-% IV SOLN
2.0000 g | Freq: Three times a day (TID) | INTRAVENOUS | Status: AC
  Administered 2017-10-16 – 2017-10-17 (×2): 2 g via INTRAVENOUS
  Filled 2017-10-16 (×2): qty 100

## 2017-10-16 MED ORDER — VITAMIN C 500 MG PO TABS
1000.0000 mg | ORAL_TABLET | Freq: Every day | ORAL | Status: DC
  Administered 2017-10-17: 1000 mg via ORAL
  Filled 2017-10-16: qty 2

## 2017-10-16 MED ORDER — ACETAMINOPHEN 650 MG RE SUPP: 650.0000 mg | RECTAL | Status: DC | PRN

## 2017-10-16 MED ORDER — PROPOFOL 10 MG/ML IV BOLUS
INTRAVENOUS | Status: DC | PRN
  Administered 2017-10-16: 160 mg via INTRAVENOUS
  Administered 2017-10-16: 40 mg via INTRAVENOUS

## 2017-10-16 MED ORDER — ACETAMINOPHEN 325 MG PO TABS: 650.0000 mg | ORAL_TABLET | ORAL | Status: DC | PRN

## 2017-10-16 MED ORDER — PROMETHAZINE HCL 25 MG/ML IJ SOLN: 6.2500 mg | INTRAMUSCULAR | Status: DC | PRN

## 2017-10-16 MED ORDER — MELATONIN 5 MG PO TABS: 1.0000 | ORAL_TABLET | Freq: Every day | ORAL | Status: DC

## 2017-10-16 MED ORDER — OXYCODONE HCL 5 MG PO TABS: 5.0000 mg | ORAL_TABLET | ORAL | Status: DC | PRN

## 2017-10-16 MED ORDER — SUGAMMADEX SODIUM 200 MG/2ML IV SOLN
INTRAVENOUS | Status: DC | PRN
  Administered 2017-10-16: 200 mg via INTRAVENOUS

## 2017-10-16 MED ORDER — THROMBIN (RECOMBINANT) 5000 UNITS EX SOLR
CUTANEOUS | Status: AC
  Filled 2017-10-16: qty 10000

## 2017-10-16 MED ORDER — OXYCODONE HCL 5 MG PO TABS
10.0000 mg | ORAL_TABLET | ORAL | Status: DC | PRN
  Administered 2017-10-16 – 2017-10-17 (×2): 10 mg via ORAL
  Filled 2017-10-16 (×2): qty 2

## 2017-10-16 MED ORDER — SODIUM CHLORIDE 0.9 % IV SOLN
INTRAVENOUS | Status: DC | PRN
  Administered 2017-10-16: 500 mL

## 2017-10-16 SURGICAL SUPPLY — 50 items
BAG ZIPLOCK 12X15 (MISCELLANEOUS) IMPLANT
CLEANER TIP ELECTROSURG 2X2 (MISCELLANEOUS) ×3 IMPLANT
CLOSURE WOUND 1/2 X4 (GAUZE/BANDAGES/DRESSINGS) ×1
CLOTH 2% CHLOROHEXIDINE 3PK (PERSONAL CARE ITEMS) ×3 IMPLANT
COVER SURGICAL LIGHT HANDLE (MISCELLANEOUS) ×3 IMPLANT
DRAPE MICROSCOPE LEICA (MISCELLANEOUS) ×3 IMPLANT
DRAPE SHEET LG 3/4 BI-LAMINATE (DRAPES) IMPLANT
DRAPE SURG 17X11 SM STRL (DRAPES) ×3 IMPLANT
DRAPE UTILITY XL STRL (DRAPES) ×3 IMPLANT
DRSG AQUACEL AG ADV 3.5X 4 (GAUZE/BANDAGES/DRESSINGS) IMPLANT
DRSG AQUACEL AG ADV 3.5X 6 (GAUZE/BANDAGES/DRESSINGS) ×3 IMPLANT
DURAPREP 26ML APPLICATOR (WOUND CARE) ×3 IMPLANT
DURASEAL SPINE SEALANT 3ML (MISCELLANEOUS) IMPLANT
ELECT BLADE TIP CTD 4 INCH (ELECTRODE) ×3 IMPLANT
ELECT REM PT RETURN 15FT ADLT (MISCELLANEOUS) ×3 IMPLANT
GLOVE BIOGEL PI IND STRL 7.0 (GLOVE) ×2 IMPLANT
GLOVE BIOGEL PI INDICATOR 7.0 (GLOVE) ×4
GLOVE SURG SS PI 7.0 STRL IVOR (GLOVE) ×6 IMPLANT
GLOVE SURG SS PI 8.0 STRL IVOR (GLOVE) ×6 IMPLANT
GOWN STRL REUS W/ TWL LRG LVL3 (GOWN DISPOSABLE) ×1 IMPLANT
GOWN STRL REUS W/TWL LRG LVL3 (GOWN DISPOSABLE) ×2
GOWN STRL REUS W/TWL XL LVL3 (GOWN DISPOSABLE) ×6 IMPLANT
HEMOSTAT SPONGE AVITENE ULTRA (HEMOSTASIS) IMPLANT
IV CATH 14GX2 1/4 (CATHETERS) IMPLANT
KIT BASIN OR (CUSTOM PROCEDURE TRAY) ×3 IMPLANT
KIT POSITIONING SURG ANDREWS (MISCELLANEOUS) ×3 IMPLANT
MANIFOLD NEPTUNE II (INSTRUMENTS) ×3 IMPLANT
MARKER SKIN DUAL TIP RULER LAB (MISCELLANEOUS) ×3 IMPLANT
NEEDLE SPNL 18GX3.5 QUINCKE PK (NEEDLE) ×6 IMPLANT
PACK LAMINECTOMY ORTHO (CUSTOM PROCEDURE TRAY) ×3 IMPLANT
PATTIES SURGICAL .5 X.5 (GAUZE/BANDAGES/DRESSINGS) ×3 IMPLANT
PATTIES SURGICAL .75X.75 (GAUZE/BANDAGES/DRESSINGS) ×3 IMPLANT
PATTIES SURGICAL 1X1 (DISPOSABLE) IMPLANT
RUBBERBAND STERILE (MISCELLANEOUS) ×3 IMPLANT
SPONGE LAP 4X18 X RAY DECT (DISPOSABLE) ×3 IMPLANT
SPONGE SURGIFOAM ABS GEL 100 (HEMOSTASIS) ×3 IMPLANT
STAPLER VISISTAT (STAPLE) IMPLANT
STRIP CLOSURE SKIN 1/2X4 (GAUZE/BANDAGES/DRESSINGS) ×2 IMPLANT
SUT NURALON 4 0 TR CR/8 (SUTURE) IMPLANT
SUT PROLENE 3 0 PS 2 (SUTURE) IMPLANT
SUT VIC AB 1 CT1 27 (SUTURE)
SUT VIC AB 1 CT1 27XBRD ANTBC (SUTURE) IMPLANT
SUT VIC AB 1-0 CT2 27 (SUTURE) IMPLANT
SUT VIC AB 2-0 CT1 27 (SUTURE)
SUT VIC AB 2-0 CT1 TAPERPNT 27 (SUTURE) IMPLANT
SUT VIC AB 2-0 CT2 27 (SUTURE) IMPLANT
SYR 3ML LL SCALE MARK (SYRINGE) IMPLANT
TOWEL OR 17X26 10 PK STRL BLUE (TOWEL DISPOSABLE) ×3 IMPLANT
TOWEL OR NON WOVEN STRL DISP B (DISPOSABLE) ×3 IMPLANT
YANKAUER SUCT BULB TIP NO VENT (SUCTIONS) ×3 IMPLANT

## 2017-10-16 NOTE — Discharge Instructions (Signed)

## 2017-10-16 NOTE — Interval H&P Note (Signed)
History and Physical Interval Note:  10/16/2017 8:26 AM  Debra [name]  has presented today for surgery, with the diagnosis of HNP L5-S1 left, stenosis L4-5  The various methods of treatment have been discussed with the patient and family. After consideration of risks, benefits and other options for treatment, the patient has consented to  Procedure(s) with comments: Microlumbar decompression L4-5, L5-S1 left (Left) - 120 mins as a surgical intervention .  The patient's history has been reviewed, patient examined, no change in status, stable for surgery.  I have reviewed the patient's chart and labs.  Questions were answered to the patient's satisfaction.     [name] C

## 2017-10-16 NOTE — Interval H&P Note (Signed)
History and Physical Interval Note:  10/16/2017 8:26 AM  Debra [name]  has presented today for surgery, with the diagnosis of HNP L5-S1 left, stenosis L4-5  The various methods of treatment have been discussed with the patient and family. After consideration of risks, benefits and other options for treatment, the patient has consented to  Procedure(s) with comments: Microlumbar decompression L4-5, L5-S1 left (Left) - 120 mins as a surgical intervention .  The patient's history has been reviewed, patient examined, no change in status, stable for surgery.  I have reviewed the patient's chart and labs.  Questions were answered to the patient's satisfaction.     Vicki Pasqual C

## 2017-10-16 NOTE — Anesthesia Postprocedure Evaluation (Signed)
Anesthesia Post Note  Patient: Debra [name]  Procedure(s) Performed: Microlumbar decompression L4-5, L5-S1 left (Left )     Patient location during evaluation: PACU Anesthesia Type: General Level of consciousness: awake and alert Pain management: pain level controlled Vital Signs Assessment: post-procedure vital signs reviewed and stable Respiratory status: spontaneous breathing, nonlabored ventilation, respiratory function stable and patient connected to nasal cannula oxygen Cardiovascular status: blood pressure returned to baseline and stable Postop Assessment: no apparent nausea or vomiting Anesthetic complications: no    Last Vitals:  Vitals:   10/16/17 1145 10/16/17 1204  BP: (!) 146/71 139/76  Pulse: 81 82  Resp: 14 15  Temp: 36.8 C 36.5 C  SpO2: 100% 100%    Last Pain:  Vitals:   10/16/17 1145  TempSrc:   PainSc: 0-No pain                 Tiajuana Amass

## 2017-10-16 NOTE — Anesthesia Procedure Notes (Signed)
Procedure Name: Intubation Date/Time: 10/16/2017 8:43 AM Performed by: Claudia Desanctis, CRNA Pre-anesthesia Checklist: Patient identified, Emergency Drugs available, Suction available and Patient being monitored Patient Re-evaluated:Patient Re-evaluated prior to induction Oxygen Delivery Method: Circle system utilized Preoxygenation: Pre-oxygenation with 100% oxygen Induction Type: IV induction Ventilation: Mask ventilation without difficulty Laryngoscope Size: Glidescope and 3 Grade View: Grade I Tube type: Oral Tube size: 7.0 mm Number of attempts: 1 Airway Equipment and Method: Stylet Placement Confirmation: ETT inserted through vocal cords under direct vision,  positive ETCO2 and breath sounds checked- equal and bilateral Secured at: 21 cm Tube secured with: Tape Dental Injury: Teeth and Oropharynx as per pre-operative assessment  Comments: glidescope used electively due to limited mouth opening

## 2017-10-16 NOTE — Brief Op Note (Signed)
10/16/2017  10:50 AM  PATIENT:  Debra [name]  62 y.o. female  PRE-OPERATIVE DIAGNOSIS:  HNP L5-S1 left, stenosis L4-5  POST-OPERATIVE DIAGNOSIS:  HNP L5-S1 left, stenosis L4-5  PROCEDURE:  Procedure(s) with comments: Microlumbar decompression L4-5, L5-S1 left (Left) - 120 mins  SURGEON:  Surgeon(s) and Role:    Susa Day, MD - Primary  PHYSICIAN ASSISTANT:   ASSISTANTS: [place]   ANESTHESIA:   general  EBL:  20 mL   BLOOD ADMINISTERED:none  DRAINS: none   LOCAL MEDICATIONS USED:  MARCAINE     SPECIMEN:  No Specimen  DISPOSITION OF SPECIMEN:  N/A  COUNTS:  YES  TOURNIQUET:  * No tourniquets in log *  DICTATION: .Other Dictation: Dictation Number [id number]  PLAN OF CARE: Admit for overnight observation  PATIENT DISPOSITION:  PACU - hemodynamically stable.   Delay start of Pharmacological VTE agent (>24hrs) due to surgical blood loss or risk of bleeding: yes

## 2017-10-16 NOTE — Progress Notes (Signed)
PHARMACIST - PHYSICIAN ORDER COMMUNICATION  CONCERNING: P&T Medication Policy on Herbal Medications  DESCRIPTION:  This patient's order for:  Melatonin  has been noted.  This product(s) is classified as an "herbal" or natural product. Due to a lack of definitive safety studies or FDA approval, nonstandard manufacturing practices, plus the potential risk of unknown drug-drug interactions while on inpatient medications, the Pharmacy and Therapeutics Committee does not permit the use of "herbal" or natural products of this type within [facility].   ACTION TAKEN: The pharmacy department is unable to verify this order at this time and your patient has been informed of this safety policy. Please reevaluate patient's clinical condition at discharge and address if the herbal or natural product(s) should be resumed at that time.  Reuel Boom, PharmD, BCPS Pager: [phone] 10/16/2017, 12:25 PM

## 2017-10-16 NOTE — Transfer of Care (Signed)
Immediate Anesthesia Transfer of Care Note  Patient: Debra [name]  Procedure(s) Performed: Microlumbar decompression L4-5, L5-S1 left (Left )  Patient Location: PACU  Anesthesia Type:General  Level of Consciousness: drowsy and patient cooperative  Airway & Oxygen Therapy: Patient Spontanous Breathing and Patient connected to face mask  Post-op Assessment: Report given to RN and Post -op Vital signs reviewed and stable  Post vital signs: Reviewed and stable  Last Vitals:  Vitals:   10/16/17 0630  BP: (!) 154/87  Pulse: 79  Resp: 16  Temp: 36.6 C  SpO2: 98%    Last Pain:  Vitals:   10/16/17 0641  TempSrc:   PainSc: 5       Patients Stated Pain Goal: 4 (34/91/79 1505)  Complications: No apparent anesthesia complications

## 2017-10-17 DIAGNOSIS — M4807 Spinal stenosis, lumbosacral region: Secondary | ICD-10-CM | POA: Diagnosis not present

## 2017-10-17 MED ORDER — OXYCODONE HCL 5 MG PO TABS: 5.0000 mg | ORAL_TABLET | ORAL | Status: DC | PRN

## 2017-10-17 NOTE — Progress Notes (Signed)
    Durable Medical Equipment  (From admission, onward)        Start     Ordered   10/17/17 0932  For home use only DME Walker rolling  Once    Question:  Patient needs a walker to treat with the following condition  Answer:  Osteoarthritis   10/17/17 0932

## 2017-10-17 NOTE — Op Note (Signed)
NAMEMALCOLM, HETZ                ACCOUNT NO.:  [id number]  MEDICAL RECORD NO.:  [id number]  LOCATION:                                 FACILITY:  PHYSICIAN:  Susa Day, M.D.         DATE OF BIRTH:  DATE OF PROCEDURE:  10/16/2017 DATE OF DISCHARGE:                              OPERATIVE REPORT   PREOPERATIVE DIAGNOSIS:  Spinal stenosis, L4-5 and L5-S1, left.  POSTOPERATIVE DIAGNOSIS:  Spinal stenosis, L4-5 and L5-S1, left.  PROCEDURE PERFORMED: 1. Microlumbar decompression, L4-5 and L5-S1, left. 2. Foraminotomies, L4, L5, and S1.  ANESTHESIA:  General.  ASSISTANT:  Lacie Draft, PA.  HISTORY:  This is a very pleasant 62 year old with refractory L5-S1 radicular pain, left side.  She had lateral recess stenosis particularly at L4-5 and also at L5-S1, see mainly on her CT myelogram.  She did have a grade 1 listhesis at L4-5 without instability on flexion, extension, no back pain and a tall disk.  We discussed failing conservative treatment, decompression of L4-5 and L5-S1, microlumbar with foraminotomies and decompressing the lateral recess to decrease compression of L5 and S1 nerve roots.  We discussed this without a fusion given the above-mentioned parameters.  Risks and benefits were discussed including bleeding, infection, damage to the neurovascular structures, no change in symptoms, worsening symptoms, DVT, PE, anesthetic complications excess or need for fusion in the future.  TECHNIQUE:  With the patient in supine position after induction of adequate general anesthesia, 2 g of Kefzol, placed prone on the Viola frame.  All bony prominences were well padded.  Lumbar region was prepped and draped in usual sterile fashion.  Two 18-gauge spinal needles were utilized to localize the L4-5 and L5-S1 interspace, confirmed with x-ray.  Incision was made from the spinous process of L4 to below L5.  Subcutaneous tissue was dissected.  Electrocautery was utilized to achieve  hemostasis.  Dorsolumbar fascia was divided in line with skin incision.  Paraspinous muscle elevated from lamina of L4-5 and L5-S1.  Operating microscope was draped and brought on the surgical field.  Hemilaminotomy of the caudad edge of L4 was performed with a Leksell rongeur and a 3-mm Kerrison and 2-mm Kerrison to detach the cephalad edge of the ligamentum flavum preserving the pars.  A microcurette utilized to detach the ligamentum flavum from the cephalad edge of L4.  Ligamentum flavum removed from the interspace after neuro patty placed beneath the ligamentum flavum.  There was hypertrophic ligamentum flavum noted and facet hypertrophy.  We identified the inferior process of L4 and with a micro-osteotome, removed approximately 2 mm of that.  We identified the superior articulating process of L5. Developed a plane between the ligamentum flavum and the facet protecting the nerve root.  I decompressed the lateral recess to the medial border the pedicle performing a foraminotomy of L5, which was very stenotic and a L4, undercutting the facet and removing the ligamentum.  This was compressing the L5 root.  Following the decompression, I gently mobilized further the L5 root medially.  There was no disk herniation. Hypertrophic venous plexuses were noted.  They were cauterized and lysed, this was a tethering factor.  Following this, there was 1 cm of excursion of the 5 root medial to pedicle without tension.  There was no disk herniation within the foramen beneath the thecal sac, the shoulder or the axilla of the root, and a probe passed freely at the foramen of L4 and L5 and above the pedicle of L4 and below L5.  Obtained a confirmatory radiograph here with a Penfield 4 in the disk space at L5- S1 as well.  Copiously irrigated with antibiotic irrigation and electrocautery was utilized to achieve hemostasis with bipolar electrocautery and thrombin-soaked Gelfoam.  We then turned  our attention to L5-S1 interspace, and in a similar fashion, detached the ligamentum flavum from the cephalad edge of S1, performed a foraminotomy, which was somewhat stenotic.  Removed the ligamentum flavum from the interspace, preserving the neural elements.  I gently mobilized the S1 nerve root as this was compressing the lateral recess, decompressed the lateral recess to medial border of pedicle.  There was no disk herniation noted or bulge.  Following this decompression and laminotomies, there was good excursion of the S1 nerve root medial to pedicle and neural probe passed freely at the foramen of S1 and L5 to above the pedicle of L5.  No active bleeding or CSF leakage was noted. At this point, the operative lamp on the microscope did not function. After multiple attempts to restore, we were unable to do so.  We removed the microscope.  We then looked back and there was no active bleeding or CSF leakage.  I copiously irrigated the wound.  Thrombin-soaked Gelfoam was placed in laminotomy defect.  Dorsolumbar fascia with 1 Vicryl, subcu with 2-0 and skin with Prolene.  Sterile dressing applied.  Placed supine on the hospital bed, extubated without difficulty and transported to the recovery room in satisfactory condition.  The patient tolerated the procedure well.  No complications.  Assistant, Lacie Draft, [place].  Minimal blood loss.     Susa Day, M.D.   ______________________________ Susa Day, M.D.    Geralynn Rile  D:  10/16/2017  T:  10/17/2017  Job:  [id number]

## 2017-10-17 NOTE — Progress Notes (Signed)
Subjective: 1 Day Post-Op Procedure(s) (LRB): Microlumbar decompression L4-5, L5-S1 left (Left) Patient reports pain as 3 on 0-10 scale.    Objective: Vital signs in last 24 hours: Temp:  [97.6 F (36.4 C)-98.3 F (36.8 C)] 98.2 F (36.8 C) (01/10 0536) Pulse Rate:  [66-108] 66 (01/10 0536) Resp:  [8-18] 16 (01/10 0536) BP: (106-176)/(55-90) 127/60 (01/10 0536) SpO2:  [97 %-100 %] 100 % (01/10 0536)  Intake/Output from previous day: 01/09 0701 - 01/10 0700 In: 3042.5 [P.O.:690; I.V.:2197.5; IV Piggyback:155] Out: 2270 [Urine:2250; Blood:20] Intake/Output this shift: No intake/output data recorded.  No results for input(s): HGB in the last 72 hours. No results for input(s): WBC, RBC, HCT, PLT in the last 72 hours. No results for input(s): NA, K, CL, CO2, BUN, CREATININE, GLUCOSE, CALCIUM in the last 72 hours. No results for input(s): LABPT, INR in the last 72 hours.  Neurologically intact Sensation intact distally Intact pulses distally Incision: dressing C/D/I No cellulitis present Compartment soft  No DVT  Assessment/Plan: 1 Day Post-Op Procedure(s) (LRB): Microlumbar decompression L4-5, L5-S1 left (Left) Advance diet Up with therapy D/C IV fluids Discharge home with home health  Demir Titsworth C 10/17/2017, 7:49 AM

## 2017-10-17 NOTE — Evaluation (Signed)
Occupational Therapy Evaluation Patient Details Name: Debra [name] MRN: [id number] DOB: Sep 05, 1956 Today's Date: 10/17/2017    History of Present Illness s/p L5-S1 decompression    Clinical Impression   This 62 year old female was admitted for the above. All education was completed. No further OT is needed at this time     Follow Up Recommendations  No OT follow up;Supervision/Assistance - 24 hour    Equipment Recommendations  (pt wants an elevated toilet seat ring (NOT 3:1))    Recommendations for Other Services       Precautions / Restrictions Precautions Precautions: Back Restrictions Weight Bearing Restrictions: No      Mobility Bed Mobility               General bed mobility comments: supervision; cues for sequence. Pt plans to sleep in lift chair  Transfers Overall transfer level: Needs assistance Equipment used: None Transfers: Sit to/from Stand Sit to Stand: Supervision         General transfer comment: cues for back precautions    Balance                                           ADL either performed or assessed with clinical judgement   ADL Overall ADL's : Needs assistance/impaired                     Lower Body Dressing: Minimal assistance;Sit to/from stand   Toilet Transfer: Ambulation;Comfort height toilet;Grab bars;Min guard   Toileting- Clothing Manipulation and Hygiene: Supervision/safety;Sit to/from stand;With adaptive equipment   Tub/ Shower Transfer: Walk-in shower;Min guard;Ambulation     General ADL Comments: reviewed back precautions and ADLs.  Pt has long tongs she has been using for dressing. Will need assistance with ted hose.  She has toilet aide.  Husband has had back sx in the past.  Pt has a built in shower seat, but it may be slippery. Educated that if she does a standing shower, she should wash what she can and sit on commode for rest and keep both feet on the floor.  Pt needs set  up/supervision for UB adls     Vision         Perception     Praxis      Pertinent Vitals/Pain Pain Assessment: Faces Faces Pain Scale: Hurts little more Pain Location: back Pain Descriptors / Indicators: (pulling when lifting R leg) Pain Intervention(s): Limited activity within patient's tolerance;Monitored during session;Premedicated before session;Repositioned     Hand Dominance     Extremity/Trunk Assessment Upper Extremity Assessment Upper Extremity Assessment: Overall WFL for tasks assessed           Communication Communication Communication: No difficulties   Cognition Arousal/Alertness: Awake/alert Behavior During Therapy: WFL for tasks assessed/performed Overall Cognitive Status: Within Functional Limits for tasks assessed                                     General Comments       Exercises     Shoulder Instructions      Home Living Family/patient expects to be discharged to:: Private residence Living Arrangements: Spouse/significant other Available Help at Discharge: Available 24 hours/day Type of Home: House Home Access: Stairs to enter [employer] of Steps: 1   Home  Layout: One level     Bathroom Shower/Tub: Occupational psychologist: Standard     Home Equipment: Grab bars - toilet;Shower seat(lift chair: she plans to sleep in)   Additional Comments: husband has a cane      Prior Functioning/Environment Level of Independence: Independent        Comments: grab bars around toilet        OT Problem List:        OT Treatment/Interventions:      OT Goals(Current goals can be found in the care plan section) Acute Rehab OT Goals Patient Stated Goal: have a good recovery; not do anything wrong OT Goal Formulation: All assessment and education complete, DC therapy  OT Frequency:     Barriers to D/C:            Co-evaluation              AM-PAC PT "6 Clicks" Daily Activity      Outcome Measure Help from another person eating meals?: None Help from another person taking care of personal grooming?: A Little Help from another person toileting, which includes using toliet, bedpan, or urinal?: A Little Help from another person bathing (including washing, rinsing, drying)?: A Little Help from another person to put on and taking off regular upper body clothing?: A Little Help from another person to put on and taking off regular lower body clothing?: A Little 6 Click Score: 19   End of Session    Activity Tolerance: Patient tolerated treatment well Patient left: in chair;with call bell/phone within reach  OT Visit Diagnosis: Muscle weakness (generalized) (M62.81)                Time: 1610-9604 OT Time Calculation (min): 21 min Charges:  OT General Charges $OT Visit: 1 Visit OT Evaluation $OT Eval Low Complexity: 1 Low G-Codes:     [place], OTR/L [phone] 10/17/2017  Brandis Wixted 10/17/2017, 8:57 AM

## 2017-10-17 NOTE — Evaluation (Signed)
Physical Therapy Evaluation Patient Details Name: Debra [name] MRN: [id number] DOB: [birth year] Today's Date: 10/17/2017   History of Present Illness  s/p L5-S1 decompression   Clinical Impression  Pt admitted s/p back surgery and presents with functional mobility limitations 2* post op pain and back precautions.  Pt is currently mobilizing at Sup level of assist and with good awareness of back precautions.  Pt plans dc home with date with assist of family.    Follow Up Recommendations No PT follow up    Equipment Recommendations  Rolling walker with 5" wheels    Recommendations for Other Services       Precautions / Restrictions Precautions Precautions: Back Precaution Comments: Pt recalls all back precautions without cues - states husband has had back surgery in the past Restrictions Weight Bearing Restrictions: No      Mobility  Bed Mobility Overal bed mobility: Needs Assistance Bed Mobility: Sit to Supine       Sit to supine: Supervision   General bed mobility comments: supervision; cues for sequence. Pt plans to sleep in lift chair  Transfers Overall transfer level: Needs assistance Equipment used: None Transfers: Sit to/from Stand Sit to Stand: Supervision         General transfer comment: cues for back precautions  Ambulation/Gait Ambulation/Gait assistance: Min guard;Supervision Ambulation Distance (Feet): 400 Feet Assistive device: Rolling walker (2 wheeled);None Gait Pattern/deviations: Step-through pattern;Decreased step length - right;Decreased step length - left;Shuffle;Trunk flexed Gait velocity: decr Gait velocity interpretation: Below normal speed for age/gender General Gait Details: 200' with RW and 200' sans AD.  MIn cues for posture and position from RW  Stairs Stairs: Yes Stairs assistance: Min assist Stair Management: No rails;Step to pattern;Forwards Number of Stairs: 1 General stair comments: cues for sequence, steady  assist  Wheelchair Mobility    Modified Rankin (Stroke Patients Only)       Balance                                             Pertinent Vitals/Pain Pain Assessment: 0-10 Pain Score: 4  Pain Location: back Pain Descriptors / Indicators: Sore Pain Intervention(s): Limited activity within patient's tolerance;Monitored during session;Premedicated before session    Home Living Family/patient expects to be discharged to:: Private residence Living Arrangements: Spouse/significant other Available Help at Discharge: Available 24 hours/day Type of Home: House Home Access: Stairs to enter   [employer] of Steps: 1 Home Layout: One level Home Equipment: Grab bars - toilet;Shower seat Additional Comments: husband has a cane    Prior Function Level of Independence: Independent         Comments: grab bars around toilet     Hand Dominance        Extremity/Trunk Assessment   Upper Extremity Assessment Upper Extremity Assessment: Overall WFL for tasks assessed    Lower Extremity Assessment Lower Extremity Assessment: Overall WFL for tasks assessed       Communication   Communication: No difficulties  Cognition Arousal/Alertness: Awake/alert Behavior During Therapy: WFL for tasks assessed/performed Overall Cognitive Status: Within Functional Limits for tasks assessed                                        General Comments      Exercises  Assessment/Plan    PT Assessment Patient needs continued PT services  PT Problem List Decreased activity tolerance;Decreased mobility;Decreased knowledge of use of DME;Pain       PT Treatment Interventions      PT Goals (Current goals can be found in the Care Plan section)  Acute Rehab PT Goals Patient Stated Goal: have a good recovery; not do anything wrong PT Goal Formulation: All assessment and education complete, DC therapy    Frequency Min 1X/week   Barriers to  discharge        Co-evaluation               AM-PAC PT "6 Clicks" Daily Activity  Outcome Measure Difficulty turning over in bed (including adjusting bedclothes, sheets and blankets)?: A Little Difficulty moving from lying on back to sitting on the side of the bed? : A Little Difficulty sitting down on and standing up from a chair with arms (e.g., wheelchair, bedside commode, etc,.)?: A Little Help needed moving to and from a bed to chair (including a wheelchair)?: A Little Help needed walking in hospital room?: A Little Help needed climbing 3-5 steps with a railing? : A Little 6 Click Score: 18    End of Session   Activity Tolerance: Patient tolerated treatment well Patient left: in bed;with call bell/phone within reach Nurse Communication: Mobility status PT Visit Diagnosis: Difficulty in walking, not elsewhere classified (R26.2)    Time: 1308-6578 PT Time Calculation (min) (ACUTE ONLY): 19 min   Charges:   PT Evaluation $PT Eval Low Complexity: 1 Low     PT G Codes:        Pg [phone]   Letanya Froh 10/17/2017, 3:07 PM

## 2017-10-17 NOTE — Discharge Summary (Signed)
Physician Discharge Summary   Patient ID: Debra [name] MRN: [id number] DOB/AGE: 10-Dec-1955 62 y.o.  Admit date: 10/16/2017 Discharge date: 10/17/2017  Primary Diagnosis:   HNP L5-S1 left, stenosis L4-5  Admission Diagnoses:  Past Medical History:  Diagnosis Date  . Lumbar back pain   . Pre-diabetes    during pregnancy    Discharge Diagnoses:   Principal Problem:   Spinal stenosis of lumbar region Active Problems:   HNP (herniated nucleus pulposus), lumbar   Spinal stenosis at L4-L5 level  Procedure:  Procedure(s) (LRB): Microlumbar decompression L4-5, L5-S1 left (Left)   Consults: None  HPI:  see H&P    Laboratory Data: Hospital Outpatient Visit on 10/11/2017  Component Date Value Ref Range Status  . Hgb A1c MFr Bld 10/11/2017 5.6  4.8 - 5.6 % Final   Comment: (NOTE) Pre diabetes:          5.7%-6.4% Diabetes:              >6.4% Glycemic control for   <7.0% adults with diabetes   . Mean Plasma Glucose 10/11/2017 114.02  mg/dL Final   Performed at [facility] [address], [place], [place] [address]  . Sodium 10/11/2017 139  135 - 145 mmol/L Final  . Potassium 10/11/2017 4.4  3.5 - 5.1 mmol/L Final  . Chloride 10/11/2017 106  101 - 111 mmol/L Final  . CO2 10/11/2017 26  22 - 32 mmol/L Final  . Glucose, Bld 10/11/2017 111* 65 - 99 mg/dL Final  . BUN 10/11/2017 20  6 - 20 mg/dL Final  . Creatinine, Ser 10/11/2017 0.69  0.44 - 1.00 mg/dL Final  . Calcium 10/11/2017 9.5  8.9 - 10.3 mg/dL Final  . GFR calc non Af Amer 10/11/2017 >60  >60 mL/min Final  . GFR calc Af Amer 10/11/2017 >60  >60 mL/min Final   Comment: (NOTE) The eGFR has been calculated using the CKD EPI equation. This calculation has not been validated in all clinical situations. eGFR's persistently <60 mL/min signify possible Chronic Kidney Disease.   . Anion gap 10/11/2017 7  5 - 15 Final  . WBC 10/11/2017 8.0  4.0 - 10.5 K/uL Final  . RBC 10/11/2017 4.71  3.87 - 5.11 MIL/uL Final  .  Hemoglobin 10/11/2017 14.4  12.0 - 15.0 g/dL Final  . HCT 10/11/2017 42.0  36.0 - 46.0 % Final  . MCV 10/11/2017 89.2  78.0 - 100.0 fL Final  . MCH 10/11/2017 30.6  26.0 - 34.0 pg Final  . MCHC 10/11/2017 34.3  30.0 - 36.0 g/dL Final  . RDW 10/11/2017 12.8  11.5 - 15.5 % Final  . Platelets 10/11/2017 369  150 - 400 K/uL Final  . MRSA, PCR 10/11/2017 NEGATIVE  NEGATIVE Final  . Staphylococcus aureus 10/11/2017 NEGATIVE  NEGATIVE Final   Comment: (NOTE) The Xpert SA Assay (FDA approved for NASAL specimens in patients 17 years of age and older), is one component of a comprehensive surveillance program. It is not intended to diagnose infection nor to guide or monitor treatment.    No results for input(s): HGB in the last 72 hours. No results for input(s): WBC, RBC, HCT, PLT in the last 72 hours. No results for input(s): NA, K, CL, CO2, BUN, CREATININE, GLUCOSE, CALCIUM in the last 72 hours. No results for input(s): LABPT, INR in the last 72 hours.  X-Rays:Dg Lumbar Spine 2-3 Views  Result Date: 10/11/2017 CLINICAL DATA:  Preoperative examination for spinal surgery EXAM: LUMBAR SPINE -  2-3 VIEW COMPARISON:  Lumbar spine CT - 09/06/2017 FINDINGS: There are 5 non rib-bearing lumbar type vertebral bodies. Mild (approximately 4 mm) of anterolisthesis of L4 upon L5. Otherwise, normal alignment of the lumbar spine. Lumbar vertebral body heights appear preserved. Mild to moderate multilevel lumbar spine DDD, worse at L4-L5 with disc space height loss, endplate irregularity and sclerosis. Limited visualization of the bilateral SI joints is normal. Moderate colonic stool burden without evidence of enteric obstruction. IMPRESSION: Mild to moderate multilevel lumbar spine DDD, worse at L4-L5. Electronically Signed   By: Sandi Mariscal M.D.   On: 10/11/2017 10:28   Dg Spine Portable 1 View  Result Date: 10/16/2017 CLINICAL DATA:  Localizers for L4-5 and L5-S1 lumbar decompression. EXAM: PORTABLE SPINE - 1 VIEW  COMPARISON:  None. FINDINGS: A single intraoperative lateral view of the lumbar spine demonstrates posterior metallic localizers. This superior localizer is located posterior to the lower L4 vertebral body, the middle localizer is located posterior to the L5 vertebral body and the inferior most localizer is directed toward the L5-S1 interspace. IMPRESSION: Localizers as described. Electronically Signed   By: Margarette Canada M.D.   On: 10/16/2017 10:26   Dg Spine Portable 1 View  Result Date: 10/16/2017 CLINICAL DATA:  Back pain. EXAM: PORTABLE SPINE - 1 VIEW COMPARISON:  Intraoperative film 1. FINDINGS: Intraoperative film 2 demonstrates probes from a posterior approach lying above and below the L5 spinous process. IMPRESSION: Posterior approach probes lie above and below the L5 spinous process. Electronically Signed   By: Staci Righter M.D.   On: 10/16/2017 09:46   Dg Spine Portable 1 View  Result Date: 10/16/2017 CLINICAL DATA:  L4-5 and L5-S1 lumbar decompression. EXAM: PORTABLE SPINE - 1 VIEW COMPARISON:  Radiography 10/11/2016 FINDINGS: Needles are directed at the spinous processes of L4 and L5. Levels numbered for operative correlation. IMPRESSION: Spinous processes L4 and L5 localized. Electronically Signed   By: Nelson Chimes M.D.   On: 10/16/2017 09:17    EKG: Orders placed or performed in visit on 08/15/17  . EKG 12-Lead     Hospital Course: Patient was admitted to [facility] and taken to the OR and underwent the above state procedure without complications.  Patient tolerated the procedure well and was later transferred to the recovery room and then to the orthopaedic floor for postoperative care.  They were given PO and IV analgesics for pain control following their surgery.  They were given 24 hours of postoperative antibiotics.   PT was consulted postop to assist with mobility and transfers.  The patient was allowed to be WBAT with therapy and was taught back precautions. Discharge  planning was consulted to help with postop disposition and equipment needs.  Patient had a good night on the evening of surgery and started to get up OOB with therapy on day one. Patient was seen in rounds and was ready to go home on day one.  They were given discharge instructions and dressing directions.  They were instructed on when to follow up in the office with Dr. Tonita Cong.   Diet: Regular diet Activity:WBAT Follow-up:in 10-14 days Disposition - Home Discharged Condition: good   Discharge Instructions    Call MD / Call 911   Complete by:  As directed    If you experience chest pain or shortness of breath, CALL 911 and be transported to the hospital emergency room.  If you develope a fever above 101 F, pus (white drainage) or increased drainage or redness  at the wound, or calf pain, call your surgeon's office.   Constipation Prevention   Complete by:  As directed    Drink plenty of fluids.  Prune juice may be helpful.  You may use a stool softener, such as Colace (over the counter) 100 mg twice a day.  Use MiraLax (over the counter) for constipation as needed.   Diet - low sodium heart healthy   Complete by:  As directed    Increase activity slowly as tolerated   Complete by:  As directed      Allergies as of 10/17/2017      Reactions   Other Itching   Seasonal    Pravastatin Sodium    Other reaction(s): Joint Pains      Medication List    STOP taking these medications   Turmeric 500 MG Caps     TAKE these medications   b complex vitamins tablet Take 1 tablet by mouth daily.   Biotin 1000 MCG Chew Chew 1 each by mouth daily.   docusate sodium 100 MG capsule Commonly known as:  COLACE Take 1 capsule (100 mg total) by mouth 2 (two) times daily.   FISH OIL MAXIMUM STRENGTH 1200 MG Caps Take 3 capsules by mouth daily.   Melatonin 5 MG Tabs Take 1 tablet by mouth at bedtime.   methocarbamol 500 MG tablet Commonly known as:  ROBAXIN Take 1 tablet (500 mg total) by  mouth every 6 (six) hours as needed for muscle spasms.   oxyCODONE-acetaminophen 5-325 MG tablet Commonly known as:  PERCOCET Take 1 tablet by mouth every 4 (four) hours as needed.   polyethylene glycol packet Commonly known as:  MIRALAX / GLYCOLAX Take 17 g by mouth daily.   vitamin C 1000 MG tablet Take 1,000 mg by mouth daily.   Vitamin D 2000 units Caps Take 2 capsules by mouth at bedtime.   cholecalciferol 400 units Tabs tablet Commonly known as:  VITAMIN D Take 800 Units by mouth at bedtime.      Follow-up Information    Susa Day, MD Follow up in 2 week(s).   Specialty:  Orthopedic Surgery Contact information: [address] [place] [address] [phone]        Susa Day, MD.   Specialty:  Orthopedic Surgery Contact information: [address] [place] [address] [phone]           Signed: Lacie Draft, PA-C Orthopaedic Surgery 10/17/2017, 7:51 AM

## 2017-10-30 DIAGNOSIS — M722 Plantar fascial fibromatosis: Secondary | ICD-10-CM | POA: Insufficient documentation

## 2018-02-10 ENCOUNTER — Encounter: Payer: Self-pay | Admitting: Podiatry

## 2018-02-10 ENCOUNTER — Ambulatory Visit: Payer: Self-pay

## 2018-02-10 ENCOUNTER — Ambulatory Visit (INDEPENDENT_AMBULATORY_CARE_PROVIDER_SITE_OTHER): Payer: 59 | Admitting: Podiatry

## 2018-02-10 VITALS — BP 132/79 | HR 89

## 2018-02-10 DIAGNOSIS — M7672 Peroneal tendinitis, left leg: Secondary | ICD-10-CM

## 2018-02-10 DIAGNOSIS — R52 Pain, unspecified: Secondary | ICD-10-CM

## 2018-02-10 NOTE — Progress Notes (Signed)
This patient presents to the office for a second opinion concerning her painful left foot.  She says she has pain on her left foot and points to the course of the peroneal tendon of the left foot.  She says she presents the office with any x-rays or an MRI.  I told this patient. I would be unable to give her a second opinion on the radiographic studies since we do not have access to them.  I told her to pick up the MRI film and to make an appointment with Dr. Amalia Hailey.  RTC prn   Gardiner Barefoot DPM

## 2018-02-24 ENCOUNTER — Ambulatory Visit: Payer: 59 | Admitting: Podiatry

## 2018-03-07 ENCOUNTER — Encounter: Payer: Self-pay | Admitting: Podiatry

## 2018-03-07 ENCOUNTER — Ambulatory Visit (INDEPENDENT_AMBULATORY_CARE_PROVIDER_SITE_OTHER): Payer: 59 | Admitting: Podiatry

## 2018-03-07 DIAGNOSIS — M722 Plantar fascial fibromatosis: Secondary | ICD-10-CM

## 2018-03-07 MED ORDER — METHYLPREDNISOLONE 4 MG PO TBPK: ORAL_TABLET | ORAL | 0 refills | Status: DC

## 2018-03-07 MED ORDER — MELOXICAM 15 MG PO TABS: 15.0000 mg | ORAL_TABLET | Freq: Every day | ORAL | 1 refills | Status: AC

## 2018-03-10 NOTE — Progress Notes (Signed)
   Subjective: 62 year old female presenting today with a chief complaint of intermittent sharp, burning pain of the left foot that began 7-8 months ago. She was referred by Dr. Maxie Better for plantar fasciitis and a tear to the tendon of the left foot diagnosed through MRI. Walking for long periods of time increases the pain. She has been applying a pain cream with some relief. Patient is here for further evaluation and treatment.   Past Medical History:  Diagnosis Date  . Lumbar back pain   . Pre-diabetes    during pregnancy      Objective: Physical Exam General: The patient is alert and oriented x3 in no acute distress.  Dermatology: Skin is warm, dry and supple bilateral lower extremities. Negative for open lesions or macerations bilateral.   Vascular: Dorsalis Pedis and Posterior Tibial pulses palpable bilateral.  Capillary fill time is immediate to all digits.  Neurological: Epicritic and protective threshold intact bilateral.   Musculoskeletal: Tenderness to palpation to the plantar aspect of the left heel along the plantar fascia. All other joints range of motion within normal limits bilateral. Strength 5/5 in all groups bilateral.   MRI impression:  1. Mild plantar fasciitis with a small edematous plantar calcaneal enthesophyte.  2. No stress fracture.  3. Severe longitudinal partial tearing of the peroneus brevis tendon.   Assessment: 1. Plantar fasciitis left foot 2. Peroneal tendon tear left - asymptomatic   Plan of Care:  1. Patient evaluated. MRI reviewed.    2. Injection of 0.5cc Celestone soluspan injected into the left plantar fascia.  3. Rx for Medrol Dose Pak placed 4. Rx for Meloxicam ordered for patient. 5. Plantar fascial band(s) dispensed  6. Instructed patient regarding therapies and modalities at home to alleviate symptoms.  7. Return to clinic in 4 weeks.     Edrick Kins, DPM [facility]  Dr. Edrick Kins, DPM    [address], [place] [address]                Office [phone]  Fax [phone]

## 2018-03-21 ENCOUNTER — Other Ambulatory Visit: Payer: Self-pay | Admitting: Physician Assistant

## 2018-03-21 DIAGNOSIS — Z1231 Encounter for screening mammogram for malignant neoplasm of breast: Secondary | ICD-10-CM

## 2018-04-08 ENCOUNTER — Ambulatory Visit (INDEPENDENT_AMBULATORY_CARE_PROVIDER_SITE_OTHER): Payer: 59 | Admitting: Podiatry

## 2018-04-08 ENCOUNTER — Encounter: Payer: Self-pay | Admitting: Podiatry

## 2018-04-08 DIAGNOSIS — M722 Plantar fascial fibromatosis: Secondary | ICD-10-CM

## 2018-04-14 NOTE — Progress Notes (Signed)
   Subjective: 62 year old female presenting today for follow up evaluation of plantar fasciitis of the left foot. She states the heel is still sore but the pain has improved since onset. She has been wearing the fascial brace which helps alleviate the pain. Walking and standing for long periods of time make the pain worse. Patient is here for further evaluation and treatment.   Past Medical History:  Diagnosis Date  . Lumbar back pain   . Pre-diabetes    during pregnancy      Objective: Physical Exam General: The patient is alert and oriented x3 in no acute distress.  Dermatology: Skin is warm, dry and supple bilateral lower extremities. Negative for open lesions or macerations bilateral.   Vascular: Dorsalis Pedis and Posterior Tibial pulses palpable bilateral.  Capillary fill time is immediate to all digits.  Neurological: Epicritic and protective threshold intact bilateral.   Musculoskeletal: Tenderness to palpation to the plantar aspect of the left heel along the plantar fascia. All other joints range of motion within normal limits bilateral. Strength 5/5 in all groups bilateral.   Assessment: 1. Plantar fasciitis left foot - improved   Plan of Care:  1. Patient evaluated.  2. Injection of 0.5cc Celestone soluspan injected into the left plantar fascia.  3. Continue taking Meloxicam and wearing plantar fascial brace.  4. Return to clinic in 8 weeks. If not better, we will discuss surgery.      Edrick Kins, DPM [facility]  Dr. Edrick Kins, DPM    [address], [place] [address]                Office [phone]  Fax [phone]

## 2018-04-17 ENCOUNTER — Ambulatory Visit
Admission: RE | Admit: 2018-04-17 | Discharge: 2018-04-17 | Disposition: A | Discharge status: Home / Self Care | Payer: 59 | Source: Ambulatory Visit | Attending: Physician Assistant | Admitting: Physician Assistant

## 2018-04-17 ENCOUNTER — Telehealth: Payer: Self-pay

## 2018-04-17 DIAGNOSIS — Z1231 Encounter for screening mammogram for malignant neoplasm of breast: Secondary | ICD-10-CM

## 2018-04-17 NOTE — Telephone Encounter (Signed)
-----   Message from Mar Daring, PA-C sent at 04/17/2018 10:54 AM EDT ----- Normal mammogram. Repeat screening in one year.

## 2018-04-17 NOTE — Telephone Encounter (Signed)
Patient advised as directed below.  Thanks,  -Ronnita Paz 

## 2018-04-24 ENCOUNTER — Encounter: Payer: Self-pay | Admitting: Physician Assistant

## 2018-04-24 ENCOUNTER — Ambulatory Visit (INDEPENDENT_AMBULATORY_CARE_PROVIDER_SITE_OTHER): Payer: 59 | Admitting: Physician Assistant

## 2018-04-24 VITALS — BP 116/82 | HR 82 | Temp 97.7°F | Wt 188.4 lb

## 2018-04-24 DIAGNOSIS — B379 Candidiasis, unspecified: Secondary | ICD-10-CM | POA: Diagnosis not present

## 2018-04-24 DIAGNOSIS — R059 Cough, unspecified: Secondary | ICD-10-CM

## 2018-04-24 DIAGNOSIS — J4521 Mild intermittent asthma with (acute) exacerbation: Secondary | ICD-10-CM | POA: Diagnosis not present

## 2018-04-24 DIAGNOSIS — R05 Cough: Secondary | ICD-10-CM | POA: Diagnosis not present

## 2018-04-24 DIAGNOSIS — J069 Acute upper respiratory infection, unspecified: Secondary | ICD-10-CM | POA: Diagnosis not present

## 2018-04-24 DIAGNOSIS — H66001 Acute suppurative otitis media without spontaneous rupture of ear drum, right ear: Secondary | ICD-10-CM

## 2018-04-24 DIAGNOSIS — T3695XA Adverse effect of unspecified systemic antibiotic, initial encounter: Secondary | ICD-10-CM | POA: Diagnosis not present

## 2018-04-24 MED ORDER — AMOXICILLIN-POT CLAVULANATE 875-125 MG PO TABS: 1.0000 | ORAL_TABLET | Freq: Two times a day (BID) | ORAL | 0 refills | Status: DC

## 2018-04-24 MED ORDER — ALBUTEROL SULFATE HFA 108 (90 BASE) MCG/ACT IN AERS: 2.0000 | INHALATION_SPRAY | Freq: Four times a day (QID) | RESPIRATORY_TRACT | 0 refills | Status: DC | PRN

## 2018-04-24 MED ORDER — BENZONATATE 200 MG PO CAPS: 200.0000 mg | ORAL_CAPSULE | Freq: Two times a day (BID) | ORAL | 0 refills | Status: DC | PRN

## 2018-04-24 MED ORDER — FLUCONAZOLE 150 MG PO TABS: 150.0000 mg | ORAL_TABLET | Freq: Once | ORAL | 0 refills | Status: AC

## 2018-04-24 NOTE — Patient Instructions (Signed)
Otitis Media, Adult Otitis media occurs when there is inflammation and fluid in the middle ear. Your middle ear is a part of the ear that contains bones for hearing as well as air that helps send sounds to your brain. What are the causes? This condition is caused by a blockage in the eustachian tube. This tube drains fluid from the ear to the back of the nose (nasopharynx). A blockage in this tube can be caused by an object or by swelling (edema) in the tube. Problems that can cause a blockage include:  A cold or other upper respiratory infection.  Allergies.  An irritant, such as tobacco smoke.  Enlarged adenoids. The adenoids are areas of soft tissue located high in the back of the throat, behind the nose and the roof of the mouth.  A mass in the nasopharynx.  Damage to the ear caused by pressure changes (barotrauma). What are the signs or symptoms? Symptoms of this condition include:  Ear pain.  A fever.  Decreased hearing.  A headache.  Tiredness (lethargy).  Fluid leaking from the ear.  Ringing in the ear. How is this diagnosed? This condition is diagnosed with a physical exam. During the exam your health care provider will use an instrument called an otoscope to look into your ear and check for redness, swelling, and fluid. He or she will also ask about your symptoms. Your health care provider may also order tests, such as:  A test to check the movement of the eardrum (pneumatic otoscopy). This test is done by squeezing a small amount of air into the ear.  A test that changes air pressure in the middle ear to check how well the eardrum moves and whether the eustachian tube is working (tympanogram). How is this treated? This condition usually goes away on its own within 3-5 days. But if the condition is caused by a bacteria infection and does not go away own its own, or keeps coming back, your health care provider may:  Prescribe antibiotic medicines to treat the  infection.  Prescribe or recommend medicines to control pain. Follow these instructions at home:  Take over-the-counter and prescription medicines only as told by your health care provider.  If you were prescribed an antibiotic medicine, take it as told by your health care provider. Do not stop taking the antibiotic even if you start to feel better.  Keep all follow-up visits as told by your health care provider. This is important. Contact a health care provider if:  You have bleeding from your nose.  There is a lump on your neck.  You are not getting better in 5 days.  You feel worse instead of better. Get help right away if:  You have severe pain that is not controlled with medicine.  You have swelling, redness, or pain around your ear.  You have stiffness in your neck.  A part of your face is paralyzed.  The bone behind your ear (mastoid) is tender when you touch it.  You develop a severe headache. Summary  Otitis media is redness, soreness, and swelling of the middle ear.  This condition usually goes away on its own within 3-5 days.  If the problem does not go away in 3-5 days, your health care provider may prescribe or recommend medicines to treat your symptoms.  If you were prescribed an antibiotic medicine, take it as told by your health care provider. This information is not intended to replace advice given to you by your   to you by your health care provider. Make sure you discuss any questions you have with your health care provider. Document Released: 06/29/2004 Document Revised: 09/14/2016 Document Reviewed: 09/14/2016 Elsevier Interactive Patient Education  2018 [place]. Upper Respiratory Infection, Adult Most upper respiratory infections (URIs) are caused by a virus. A URI affects the nose, throat, and upper air passages. The most common type of URI is often called "the common cold." Follow these instructions at home:  Take medicines only as told by your  doctor.  Gargle warm saltwater or take cough drops to comfort your throat as told by your doctor.  Use a warm mist humidifier or inhale steam from a shower to increase air moisture. This may make it easier to breathe.  Drink enough fluid to keep your pee (urine) clear or pale yellow.  Eat soups and other clear broths.  Have a healthy diet.  Rest as needed.  Go back to work when your fever is gone or your doctor says it is okay. ? You may need to stay home longer to avoid giving your URI to others. ? You can also wear a face mask and wash your hands often to prevent spread of the virus.  Use your inhaler more if you have asthma.  Do not use any tobacco products, including cigarettes, chewing tobacco, or electronic cigarettes. If you need help quitting, ask your doctor. Contact a doctor if:  You are getting worse, not better.  Your symptoms are not helped by medicine.  You have chills.  You are getting more short of breath.  You have brown or red mucus.  You have yellow or brown discharge from your nose.  You have pain in your face, especially when you bend forward.  You have a fever.  You have puffy (swollen) neck glands.  You have pain while swallowing.  You have white areas in the back of your throat. Get help right away if:  You have very bad or constant: ? Headache. ? Ear pain. ? Pain in your forehead, behind your eyes, and over your cheekbones (sinus pain). ? Chest pain.  You have long-lasting (chronic) lung disease and any of the following: ? Wheezing. ? Long-lasting cough. ? Coughing up blood. ? A change in your usual mucus.  You have a stiff neck.  You have changes in your: ? Vision. ? Hearing. ? Thinking. ? Mood. This information is not intended to replace advice given to you by your health care provider. Make sure you discuss any questions you have with your health care provider. Document Released: 03/12/2008 Document Revised: 05/27/2016  Document Reviewed: 12/30/2013 Elsevier Interactive Patient Education  2018 [employer].

## 2018-04-24 NOTE — Progress Notes (Signed)
Patient: Debra [name] Female    DOB: [birth year]   62 y.o.   MRN: [id number] Visit Date: 04/24/2018  Today's Provider: Mar Daring, PA-C   Chief Complaint  Patient presents with  . Cough   Subjective:    Cough  This is a new problem. Episode onset: Monday. The cough is productive of sputum. Associated symptoms include ear pain, postnasal drip, a sore throat (on tuesday) and shortness of breath. Exacerbated by: heat  Treatments tried: Mucinex. The treatment provided moderate relief. RAD  Also having ear fullness and muffled hearing in the right ear. Was exposed to her son who had similar symptoms 2 weeks prior.    Allergies  Allergen Reactions  . Other Itching    Seasonal   . Pravastatin Sodium     Other reaction(s): Joint Pains     Current Outpatient Medications:  .  Ascorbic Acid (VITAMIN C) 1000 MG tablet, Take 1,000 mg by mouth daily. , Disp: , Rfl:  .  b complex vitamins tablet, Take 1 tablet by mouth daily., Disp: , Rfl:  .  Biotin 1000 MCG CHEW, Chew 1 each by mouth daily., Disp: , Rfl:  .  Cholecalciferol (VITAMIN D) 2000 UNITS CAPS, Take 2 capsules by mouth at bedtime. , Disp: , Rfl:  .  Melatonin 5 MG TABS, Take 1 tablet by mouth at bedtime., Disp: , Rfl:  .  methocarbamol (ROBAXIN) 500 MG tablet, Take 1 tablet (500 mg total) by mouth every 6 (six) hours as needed for muscle spasms., Disp: 40 tablet, Rfl: 1 .  Omega-3 Fatty Acids (FISH OIL MAXIMUM STRENGTH) 1200 MG CAPS, Take 3 capsules by mouth daily. , Disp: , Rfl:   Review of Systems  Constitutional: Negative.   HENT: Positive for congestion, ear pain, hearing loss (muffled right ear), postnasal drip, sinus pressure and sore throat (on tuesday). Negative for tinnitus and trouble swallowing.   Respiratory: Positive for cough and shortness of breath.   Cardiovascular: Negative.   Gastrointestinal: Negative.   Neurological: Negative.     Social History   Tobacco Use  . Smoking status: Former  Smoker    Types: Cigarettes  . Smokeless tobacco: Never Used  . Tobacco comment: 30 years quit   Substance Use Topics  . Alcohol use: Yes    Alcohol/week: 0.0 oz    Comment: OCCASIONALLY   Objective:   BP 116/82 (BP Location: Right Arm, Patient Position: Sitting, Cuff Size: Normal)   Pulse 82   Temp 97.7 F (36.5 C) (Oral)   Wt 188 lb 6.4 oz (85.5 kg)   SpO2 98%   BMI 31.35 kg/m      Physical Exam  Constitutional: She appears well-developed and well-nourished. No distress.  HENT:  Head: Normocephalic and atraumatic.  Right Ear: Hearing, external ear and ear canal normal. Tympanic membrane is erythematous and bulging. Tympanic membrane is not perforated. A middle ear effusion is present.  Left Ear: Hearing, tympanic membrane, external ear and ear canal normal.  Nose: Mucosal edema and rhinorrhea present. Right sinus exhibits no maxillary sinus tenderness and no frontal sinus tenderness. Left sinus exhibits no maxillary sinus tenderness and no frontal sinus tenderness.  Mouth/Throat: Uvula is midline, oropharynx is clear and moist and mucous membranes are normal. No oropharyngeal exudate, posterior oropharyngeal edema or posterior oropharyngeal erythema.  Neck: Normal range of motion. Neck supple. No tracheal deviation present. No thyromegaly present.  Cardiovascular: Normal rate, regular rhythm and normal heart sounds. Exam reveals  no gallop and no friction rub.  No murmur heard. Pulmonary/Chest: Effort normal and breath sounds normal. No stridor. No respiratory distress. She has no wheezes. She has no rales.  Lymphadenopathy:    She has no cervical adenopathy.  Skin: She is not diaphoretic.  Vitals reviewed.      Assessment & Plan:     1. Upper respiratory tract infection, unspecified type Worsening symptoms that have not responded to OTC medications. Will give augmentin as below. Continue allergy medications. Stay well hydrated and get plenty of rest. Call if no symptom  improvement or if symptoms worsen. - amoxicillin-clavulanate (AUGMENTIN) 875-125 MG tablet; Take 1 tablet by mouth 2 (two) times daily.  Dispense: 20 tablet; Refill: 0  2. Non-recurrent acute suppurative otitis media of right ear without spontaneous rupture of tympanic membrane See above medical treatment plan. - amoxicillin-clavulanate (AUGMENTIN) 875-125 MG tablet; Take 1 tablet by mouth 2 (two) times daily.  Dispense: 20 tablet; Refill: 0  3. Mild intermittent reactive airway disease with acute exacerbation - albuterol (PROVENTIL HFA;VENTOLIN HFA) 108 (90 Base) MCG/ACT inhaler; Inhale 2 puffs into the lungs every 6 (six) hours as needed for wheezing or shortness of breath.  Dispense: 1 Inhaler; Refill: 0  4. Cough - benzonatate (TESSALON) 200 MG capsule; Take 1 capsule (200 mg total) by mouth 2 (two) times daily as needed for cough.  Dispense: 20 capsule; Refill: 0  5. Antibiotic-induced yeast infection - fluconazole (DIFLUCAN) 150 MG tablet; Take 1 tablet (150 mg total) by mouth once for 1 dose. Repeat in 48-72 hrs if needed  Dispense: 2 tablet; Refill: 0       Mar Daring, PA-C  [facility]

## 2018-05-02 ENCOUNTER — Telehealth: Payer: Self-pay | Admitting: Podiatry

## 2018-05-02 NOTE — Telephone Encounter (Signed)
Just wanted to talk to the nurse. I just have a quick question. My number is [phone]. Thank you.

## 2018-05-02 NOTE — Telephone Encounter (Signed)
I returned patient call, she only wanted to know what kind of tear she had.  Her question was answered.

## 2018-05-13 ENCOUNTER — Ambulatory Visit (INDEPENDENT_AMBULATORY_CARE_PROVIDER_SITE_OTHER): Payer: 59 | Admitting: Podiatry

## 2018-05-13 ENCOUNTER — Encounter: Payer: Self-pay | Admitting: Podiatry

## 2018-05-13 DIAGNOSIS — M722 Plantar fascial fibromatosis: Secondary | ICD-10-CM

## 2018-05-13 DIAGNOSIS — M7672 Peroneal tendinitis, left leg: Secondary | ICD-10-CM | POA: Diagnosis not present

## 2018-05-16 NOTE — Progress Notes (Signed)
   Subjective: 62 year old female presenting today for follow up evaluation of plantar fasciitis of the left foot. She states the heel has improved but she is still experiencing some burning around the peroneal tendon. She has been wearing the fascial brace and taking Meloxicam as directed. There are no modifying factors noted. Patient is here for further evaluation and treatment.   Past Medical History:  Diagnosis Date  . Lumbar back pain   . Pre-diabetes    during pregnancy      Objective: Physical Exam General: The patient is alert and oriented x3 in no acute distress.  Dermatology: Skin is warm, dry and supple bilateral lower extremities. Negative for open lesions or macerations bilateral.   Vascular: Dorsalis Pedis and Posterior Tibial pulses palpable bilateral.  Capillary fill time is immediate to all digits.  Neurological: Epicritic and protective threshold intact bilateral.   Musculoskeletal: Tenderness to palpation to the plantar aspect of the left heel along the plantar fascia. All other joints range of motion within normal limits bilateral. Strength 5/5 in all groups bilateral.    Assessment: 1. Plantar fasciitis left foot 2. Peroneal tendinitis left/partial   Plan of Care:  1. Patient evaluated.    2. Injection of 0.5cc Celestone soluspan injected into the peroneal tendon sheath of the left foot.  3. Continue wearing plantar fascial brace and taking Meloxicam.  4. Recommended good walking shoes.  5. Return to clinic in 6 weeks.   Currently patient can only walk the neighborhood once without pain.      Edrick Kins, DPM [facility]  Dr. Edrick Kins, DPM    [address], [place] [address]                Office [phone]  Fax [phone]

## 2018-06-02 ENCOUNTER — Ambulatory Visit: Payer: 59 | Admitting: Podiatry

## 2018-06-17 LAB — HM DIABETES EYE EXAM

## 2018-06-18 ENCOUNTER — Encounter: Payer: 59 | Admitting: Podiatry

## 2018-06-24 ENCOUNTER — Encounter: Payer: Self-pay | Admitting: Physician Assistant

## 2018-06-24 ENCOUNTER — Telehealth: Payer: Self-pay | Admitting: *Deleted

## 2018-06-24 ENCOUNTER — Ambulatory Visit (INDEPENDENT_AMBULATORY_CARE_PROVIDER_SITE_OTHER): Payer: 59 | Admitting: Podiatry

## 2018-06-24 ENCOUNTER — Encounter: Payer: Self-pay | Admitting: Podiatry

## 2018-06-24 DIAGNOSIS — M722 Plantar fascial fibromatosis: Secondary | ICD-10-CM | POA: Diagnosis not present

## 2018-06-24 NOTE — Patient Instructions (Signed)
Pre-Operative Instructions  Congratulations, you have decided to take an important step towards improving your quality of life.  You can be assured that the doctors and staff at [facility] will be with you every step of the way.  Here are some important things you should know:  1. Plan to be at the surgery center/hospital at least 1 (one) hour prior to your scheduled time, unless otherwise directed by the surgical center/hospital staff.  You must have a responsible adult accompany you, remain during the surgery and drive you home.  Make sure you have directions to the surgical center/hospital to ensure you arrive on time. 2. If you are having surgery at [facility] or [facility] hospitals, you will need a copy of your medical history and physical form from your family physician within one month prior to the date of surgery. We will give you a form for your primary physician to complete.  3. We make every effort to accommodate the date you request for surgery.  However, there are times where surgery dates or times have to be moved.  We will contact you as soon as possible if a change in schedule is required.   4. No aspirin/ibuprofen for one week before surgery.  If you are on aspirin, any non-steroidal anti-inflammatory medications (Mobic, Aleve, Ibuprofen) should not be taken seven (7) days prior to your surgery.  You make take Tylenol for pain prior to surgery.  5. Medications - If you are taking daily heart and blood pressure medications, seizure, reflux, allergy, asthma, anxiety, pain or diabetes medications, make sure you notify the surgery center/hospital before the day of surgery so they can tell you which medications you should take or avoid the day of surgery. 6. No food or drink after midnight the night before surgery unless directed otherwise by surgical center/hospital staff. 7. No alcoholic beverages 24-hours prior to surgery.  No smoking 24-hours prior or 24-hours after  surgery. 8. Wear loose pants or shorts. They should be loose enough to fit over bandages, boots, and casts. 9. Don't wear slip-on shoes. Sneakers are preferred. 10. Bring your boot with you to the surgery center/hospital.  Also bring crutches or a walker if your physician has prescribed it for you.  If you do not have this equipment, it will be provided for you after surgery. 11. If you have not been contacted by the surgery center/hospital by the day before your surgery, call to confirm the date and time of your surgery. 12. Leave-time from work may vary depending on the type of surgery you have.  Appropriate arrangements should be made prior to surgery with your employer. 13. Prescriptions will be provided immediately following surgery by your doctor.  Fill these as soon as possible after surgery and take the medication as directed. Pain medications will not be refilled on weekends and must be approved by the doctor. 14. Remove nail polish on the operative foot and avoid getting pedicures prior to surgery. 15. Wash the night before surgery.  The night before surgery wash the foot and leg well with water and the antibacterial soap provided. Be sure to pay special attention to beneath the toenails and in between the toes.  Wash for at least three (3) minutes. Rinse thoroughly with water and dry well with a towel.  Perform this wash unless told not to do so by your physician.  Enclosed: 1 Ice pack (please put in freezer the night before surgery)   1 Hibiclens skin cleaner     Pre-op instructions  If you have any questions regarding the instructions, please do not hesitate to call our office.  [place]: [address], [place], [place] [address] -- [phone]  [place]: [address] [place], [place] [address] -- [phone]  [place]: [address]  [place], [place] [address] -- [phone]  [place]: [address], [address], [place], [place] [address] -- [phone]  Website:  [email or web address] 

## 2018-06-24 NOTE — Telephone Encounter (Signed)
"  I saw Dr. Amalia Hailey this morning.  He said he was going to send my paperwork over for surgery.  He said he can do it next week.  Did you get the paperwork?"  Yes, I did get the paperwork.  He can do it on September 24.  "That's Tuesday of next week.  I thought he did surgeries only on Thursdays."  He does but he doesn't have anything available on the 26th.  I sent him a message asking if the 24th was okay.  I will go ahead and put you down for that date.  If he can't, I will give you a call.

## 2018-06-25 ENCOUNTER — Telehealth: Payer: Self-pay | Admitting: *Deleted

## 2018-06-25 NOTE — Progress Notes (Signed)
   Subjective: 62 year old female presenting today for follow up evaluation of plantar fasciitis and peroneal tendinitis of the left foot. She states her pain improved for about one week after her last visit but has since returned even more severe than it was previously. She denies any relief. There are no modifying factors noted. Patient is here for further evaluation and treatment.   Past Medical History:  Diagnosis Date  . Lumbar back pain   . Pre-diabetes    during pregnancy      Objective: Physical Exam General: The patient is alert and oriented x3 in no acute distress.  Dermatology: Skin is warm, dry and supple bilateral lower extremities. Negative for open lesions or macerations bilateral.   Vascular: Dorsalis Pedis and Posterior Tibial pulses palpable bilateral.  Capillary fill time is immediate to all digits.  Neurological: Epicritic and protective threshold intact bilateral.   Musculoskeletal: Tenderness to palpation to the plantar aspect of the left heel along the plantar fascia. All other joints range of motion within normal limits bilateral. Strength 5/5 in all groups bilateral.    Assessment: 1. Plantar fasciitis left foot 2. Peroneal tendinitis left - improved   Plan of Care:  1. Patient evaluated.    2. Today we discussed the conservative versus surgical management of the presenting pathology. The patient opts for surgical management. All possible complications and details of the procedure were explained. All patient questions were answered. No guarantees were expressed or implied. 3. Authorization for surgery was initiated today. Surgery will consist of EPF left.  4. Return to clinic one week post op.  Works in an office.    Edrick Kins, DPM [facility]  Dr. Edrick Kins, DPM    [address], [place] [address]                Office [phone]  Fax [phone]

## 2018-06-25 NOTE — Telephone Encounter (Signed)
"  I'm calling to schedule my surgery."  We already have you scheduled for September 24.  "Oh we did.  I wasn't for sure because his surgery date is usually on Thursday.  What procedure am I having?  I'm trying to fill out this information online."  You are having an Endoscopic Plantar Fasciotomy.  "Okay, thank you."

## 2018-06-29 NOTE — Progress Notes (Signed)
This encounter was created in error - please disregard.

## 2018-07-01 ENCOUNTER — Other Ambulatory Visit: Payer: Self-pay | Admitting: Podiatry

## 2018-07-01 ENCOUNTER — Encounter: Payer: Self-pay | Admitting: Podiatry

## 2018-07-01 DIAGNOSIS — M722 Plantar fascial fibromatosis: Secondary | ICD-10-CM

## 2018-07-01 MED ORDER — OXYCODONE-ACETAMINOPHEN 5-325 MG PO TABS: 1.0000 | ORAL_TABLET | ORAL | 0 refills | Status: DC | PRN

## 2018-07-01 NOTE — Progress Notes (Signed)
Postop medication  Percocet 5/325mg  #30 q4h prn pain  Edrick Kins, DPM [facility]  Dr. Edrick Kins, DPM    [address], [place] [address]                Office [phone]  Fax [phone]

## 2018-07-03 ENCOUNTER — Telehealth: Payer: Self-pay | Admitting: Podiatry

## 2018-07-03 NOTE — Telephone Encounter (Signed)
I had surgery on my left heel for the plantar fasciitis on Tuesday with Dr. Amalia Hailey. I'm now at the third day and I'm struggling with walking, even getting up to go to the bathroom. I know the third day is always the worst. I'm just worried about Monday when I go back to work. I work at a desk and in [employer] and I'm constantly going back and forth. 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 If somebody could call me back at [phone]. Thank you.

## 2018-07-04 NOTE — Telephone Encounter (Signed)
I returned patient call, she stated that her foot is feeling a little better today.  She is concerned about going back to work on Monday.  I informed her to keep her foot elevated, use ice for swelling and continue with Ibuprofen/Advil through the weekend.  I informed her that if she is still having a lot of pain on Monday, she can call us and we can write her out of work for as long as she needed.  She verbalized understanding

## 2018-07-09 NOTE — Progress Notes (Signed)
Date of surgery 07/01/2018 Endoscopic plantar fasciotomy left

## 2018-07-11 ENCOUNTER — Encounter: Payer: Self-pay | Admitting: Podiatry

## 2018-07-11 ENCOUNTER — Ambulatory Visit (INDEPENDENT_AMBULATORY_CARE_PROVIDER_SITE_OTHER): Payer: 59 | Admitting: Podiatry

## 2018-07-11 VITALS — BP 128/73 | HR 63 | Resp 16

## 2018-07-11 DIAGNOSIS — M722 Plantar fascial fibromatosis: Secondary | ICD-10-CM

## 2018-07-11 DIAGNOSIS — Z9889 Other specified postprocedural states: Secondary | ICD-10-CM

## 2018-07-13 NOTE — Progress Notes (Signed)
   Subjective:  Patient presents today status post EPF left. DOS: 07/01/18. She states she is doing well. She denies any significant pain or modifying factors. She has been using the CAM boot as directed. Patient is here for further evaluation and treatment.    Past Medical History:  Diagnosis Date  . Lumbar back pain   . Pre-diabetes    during pregnancy       Objective/Physical Exam Neurovascular status intact.  Skin incisions appear to be well coapted with sutures and staples intact. No sign of infectious process noted. No dehiscence. No active bleeding noted. Moderate edema noted to the surgical extremity.  Assessment: 1. s/p EPF left. DOS: 07/01/18   Plan of Care:  1. Patient was evaluated. 2. Recommended antibiotic ointment daily with a bandage.  3. Continue weightbearing in CAM boot.  4. Return to clinic in one week.    Edrick Kins, DPM [facility]  Dr. Edrick Kins, [place] [address]                                        [place], [place] [address]                Office [phone]  Fax [phone]

## 2018-07-18 ENCOUNTER — Encounter: Payer: Self-pay | Admitting: Podiatry

## 2018-07-18 ENCOUNTER — Ambulatory Visit (INDEPENDENT_AMBULATORY_CARE_PROVIDER_SITE_OTHER): Payer: 59 | Admitting: Podiatry

## 2018-07-18 DIAGNOSIS — M722 Plantar fascial fibromatosis: Secondary | ICD-10-CM

## 2018-07-18 DIAGNOSIS — Z9889 Other specified postprocedural states: Secondary | ICD-10-CM

## 2018-07-22 NOTE — Progress Notes (Signed)
   Subjective:  Patient presents today status post EPF left. DOS: 07/01/18. She states she is doing well. She denies any significant pain or modifying factors. She has been using the CAM boot as directed without issue. Patient is here for further evaluation and treatment.    Past Medical History:  Diagnosis Date  . Lumbar back pain   . Pre-diabetes    during pregnancy       Objective/Physical Exam Neurovascular status intact.  Skin incisions appear to be well coapted with sutures and staples intact. No sign of infectious process noted. No dehiscence. No active bleeding noted. Moderate edema noted to the surgical extremity.  Assessment: 1. s/p EPF left. DOS: 07/01/18   Plan of Care:  1. Patient was evaluated. 2. Sutures removed.  3. Transition out of CAM boot into good sneakers.  4. Return to clinic in 2 weeks.    Edrick Kins, DPM [facility]  Dr. Edrick Kins, [place]                                        [place], [place] [address]                Office [phone]  Fax [phone]

## 2018-08-05 ENCOUNTER — Telehealth: Payer: Self-pay | Admitting: Physician Assistant

## 2018-08-05 NOTE — Telephone Encounter (Signed)
Pt wanting to have blood work done a few days before her CPE appt Nov 25th with [facility] Please call pt to let her know when she can pick up her lab order.  Thanks, [employer]

## 2018-08-06 NOTE — Telephone Encounter (Signed)
Is it okay to order labs before her physical?  Thanks,   -Mickel Baas

## 2018-08-08 ENCOUNTER — Encounter: Payer: 59 | Admitting: Podiatry

## 2018-08-11 NOTE — Telephone Encounter (Signed)
Patient advised as directed below. 

## 2018-08-11 NOTE — Telephone Encounter (Signed)
Yes she can. We can only order them 7 days prior to her CPE to use the same diagnosis code to get covered. She can call a week before her CPE for the order.

## 2018-08-12 ENCOUNTER — Ambulatory Visit (INDEPENDENT_AMBULATORY_CARE_PROVIDER_SITE_OTHER): Payer: 59 | Admitting: Podiatry

## 2018-08-12 ENCOUNTER — Encounter: Payer: Self-pay | Admitting: Podiatry

## 2018-08-12 DIAGNOSIS — M722 Plantar fascial fibromatosis: Secondary | ICD-10-CM

## 2018-08-12 DIAGNOSIS — Z9889 Other specified postprocedural states: Secondary | ICD-10-CM

## 2018-08-14 NOTE — Progress Notes (Signed)
   Subjective:  Patient presents today status post EPF left. DOS: 07/01/18. She states she is doing well. She reports some intermittent sharp pain in the arch of the foot. She denies modifying factors. Patient is here for further evaluation and treatment.    Past Medical History:  Diagnosis Date  . Lumbar back pain   . Pre-diabetes    during pregnancy       Objective/Physical Exam Neurovascular status intact.  Skin incisions appear to be well coapted. No sign of infectious process noted. No dehiscence. No active bleeding noted. Moderate edema noted to the surgical extremity.  Assessment: 1. s/p EPF left. DOS: 07/01/18   Plan of Care:  1. Patient was evaluated. 2. May resume full activity with no restrictions.  3. Recommended good shoe gear.  4. Return to clinic as needed.     Edrick Kins, DPM [facility]  Dr. Edrick Kins, [address]                                        [place], [place] [address]                Office [phone]  Fax [phone]

## 2018-08-25 ENCOUNTER — Telehealth: Payer: Self-pay | Admitting: Physician Assistant

## 2018-08-25 DIAGNOSIS — R7303 Prediabetes: Secondary | ICD-10-CM

## 2018-08-25 DIAGNOSIS — Z114 Encounter for screening for human immunodeficiency virus [HIV]: Secondary | ICD-10-CM

## 2018-08-25 DIAGNOSIS — E559 Vitamin D deficiency, unspecified: Secondary | ICD-10-CM

## 2018-08-25 DIAGNOSIS — E7800 Pure hypercholesterolemia, unspecified: Secondary | ICD-10-CM

## 2018-08-25 DIAGNOSIS — Z1329 Encounter for screening for other suspected endocrine disorder: Secondary | ICD-10-CM

## 2018-08-25 NOTE — Telephone Encounter (Signed)
Yes. Labs printed

## 2018-08-25 NOTE — Telephone Encounter (Signed)
Pt needing to order bloodwork for labs coming up.  Please call pt back to let her know when they are ready.  Thanks, [employer]

## 2018-08-25 NOTE — Telephone Encounter (Signed)
This are the only labs that she will need for physical right?

## 2018-08-26 NOTE — Telephone Encounter (Signed)
Patient advised as directed below. 

## 2018-08-28 LAB — LIPID PANEL
Chol/HDL Ratio: 5.3 ratio — ABNORMAL HIGH (ref 0.0–4.4)
Cholesterol, Total: 255 mg/dL — ABNORMAL HIGH (ref 100–199)
HDL: 48 mg/dL (ref >39)
LDL CALC: 162 mg/dL — AB (ref 0–99)
Triglycerides: 225 mg/dL — ABNORMAL HIGH (ref 0–149)
VLDL CHOLESTEROL CAL: 45 mg/dL — AB (ref 5–40)

## 2018-08-28 LAB — COMPREHENSIVE METABOLIC PANEL
ALT: 32 IU/L (ref 0–32)
AST: 25 IU/L (ref 0–40)
Albumin/Globulin Ratio: 1.7 (ref 1.2–2.2)
Albumin: 4.5 g/dL (ref 3.6–4.8)
Alkaline Phosphatase: 82 IU/L (ref 39–117)
BUN/Creatinine Ratio: 22 (ref 12–28)
BUN: 14 mg/dL (ref 8–27)
Bilirubin Total: 0.4 mg/dL (ref 0.0–1.2)
CALCIUM: 9.8 mg/dL (ref 8.7–10.3)
CO2: 23 mmol/L (ref 20–29)
CREATININE: 0.65 mg/dL (ref 0.57–1.00)
Chloride: 99 mmol/L (ref 96–106)
GFR, EST AFRICAN AMERICAN: 112 mL/min/{1.73_m2} (ref >59)
GFR, EST NON AFRICAN AMERICAN: 97 mL/min/{1.73_m2} (ref >59)
GLUCOSE: 110 mg/dL — AB (ref 65–99)
Globulin, Total: 2.6 g/dL (ref 1.5–4.5)
Potassium: 4.1 mmol/L (ref 3.5–5.2)
Sodium: 139 mmol/L (ref 134–144)
TOTAL PROTEIN: 7.1 g/dL (ref 6.0–8.5)

## 2018-08-28 LAB — CBC WITH DIFFERENTIAL/PLATELET
BASOS ABS: 0.1 10*3/uL (ref 0.0–0.2)
BASOS: 1 %
EOS (ABSOLUTE): 0.1 10*3/uL (ref 0.0–0.4)
EOS: 2 %
HEMOGLOBIN: 13.8 g/dL (ref 11.1–15.9)
Hematocrit: 40.8 % (ref 34.0–46.6)
IMMATURE GRANS (ABS): 0 10*3/uL (ref 0.0–0.1)
Immature Granulocytes: 0 %
LYMPHS: 41 %
Lymphocytes Absolute: 2.6 10*3/uL (ref 0.7–3.1)
MCH: 30.2 pg (ref 26.6–33.0)
MCHC: 33.8 g/dL (ref 31.5–35.7)
MCV: 89 fL (ref 79–97)
MONOCYTES: 7 %
Monocytes Absolute: 0.5 10*3/uL (ref 0.1–0.9)
NEUTROS ABS: 3.1 10*3/uL (ref 1.4–7.0)
Neutrophils: 49 %
PLATELETS: 337 10*3/uL (ref 150–450)
RBC: 4.57 x10E6/uL (ref 3.77–5.28)
RDW: 13.1 % (ref 12.3–15.4)
WBC: 6.3 10*3/uL (ref 3.4–10.8)

## 2018-08-28 LAB — VITAMIN D 25 HYDROXY (VIT D DEFICIENCY, FRACTURES): VIT D 25 HYDROXY: 28 ng/mL — AB (ref 30.0–100.0)

## 2018-08-28 LAB — HEMOGLOBIN A1C
Est. average glucose Bld gHb Est-mCnc: 134 mg/dL
Hgb A1c MFr Bld: 6.3 % — ABNORMAL HIGH (ref 4.8–5.6)

## 2018-08-28 LAB — HIV ANTIBODY (ROUTINE TESTING W REFLEX): HIV Screen 4th Generation wRfx: NONREACTIVE

## 2018-08-28 LAB — TSH: TSH: 2.12 u[IU]/mL (ref 0.450–4.500)

## 2018-08-29 ENCOUNTER — Telehealth: Payer: Self-pay

## 2018-08-29 NOTE — Telephone Encounter (Signed)
LMTCB

## 2018-08-29 NOTE — Telephone Encounter (Signed)
-----   Message from Mar Daring, [place] sent at 08/29/2018  2:08 PM EST ----- Blood count is normal. Kidney and liver function normal. A1c is up compared to previous years. Is 6.3 now and was 5.6 at last check. This is a prediabetic range. Cholesterol is also up from last year. However, current risk of having a cardiovascular event over the next 10 years remains low at 3.6%. At this time no need to start cholesterol lowering medications. It is beneficial to really work on healthy lifestyle modifications with dieting and exercise. Limit fatty foods, fried foods, simple carbs/sugars, and red meats in diet. Slowly increase physical activity to work up to 150 minutes of moderate activity weekly.

## 2018-09-01 ENCOUNTER — Ambulatory Visit (INDEPENDENT_AMBULATORY_CARE_PROVIDER_SITE_OTHER): Payer: 59 | Admitting: Physician Assistant

## 2018-09-01 ENCOUNTER — Encounter: Payer: Self-pay | Admitting: Physician Assistant

## 2018-09-01 VITALS — BP 148/88 | HR 89 | Temp 97.7°F | Resp 16 | Ht 65.0 in | Wt 190.8 lb

## 2018-09-01 DIAGNOSIS — Z1211 Encounter for screening for malignant neoplasm of colon: Secondary | ICD-10-CM

## 2018-09-01 DIAGNOSIS — Z1239 Encounter for other screening for malignant neoplasm of breast: Secondary | ICD-10-CM | POA: Diagnosis not present

## 2018-09-01 DIAGNOSIS — Z0000 Encounter for general adult medical examination without abnormal findings: Secondary | ICD-10-CM

## 2018-09-01 DIAGNOSIS — R7303 Prediabetes: Secondary | ICD-10-CM

## 2018-09-01 DIAGNOSIS — Z6831 Body mass index (BMI) 31.0-31.9, adult: Secondary | ICD-10-CM

## 2018-09-01 DIAGNOSIS — E6609 Other obesity due to excess calories: Secondary | ICD-10-CM

## 2018-09-01 NOTE — Telephone Encounter (Signed)
Patient in office

## 2018-09-01 NOTE — Progress Notes (Signed)
Patient: Debra [name], Female    DOB: 1956-06-10, 62 y.o.   MRN: [id number] Visit Date: 09/01/2018  Today's Provider: Mar Daring, PA-C   Chief Complaint  Patient presents with  . Annual Exam   Subjective:    Annual physical exam Debra [name] is a 62 y.o. female who presents today for health maintenance and complete physical. She feels well. She reports exercising none. She reports she is sleeping well with the Melatonin.  Labs done:08/27/18 Pap:04/27/2016 Normal and HPV Negative 04/17/18 Normal Colonoscopy: 2015, had polyps and needs 5 yr f/u. -----------------------------------------------------------------   Review of Systems  Constitutional: Positive for fatigue.  HENT: Negative.   Eyes: Negative.   Respiratory: Negative.   Cardiovascular: Negative.   Gastrointestinal: Negative.   Endocrine: Negative.   Genitourinary: Negative.   Musculoskeletal: Positive for neck pain.  Skin: Negative.   Allergic/Immunologic: Negative.   Neurological: Positive for numbness ("Feet").  Hematological: Negative.   Psychiatric/Behavioral: Negative.     Social History      She  reports that she has quit smoking. Her smoking use included cigarettes. She has never used smokeless tobacco. She reports that she drinks alcohol. She reports that she does not use drugs.       Social History   Socioeconomic History  . Marital status: Married    Spouse name: Not on file  . Number of children: Not on file  . Years of education: Not on file  . Highest education level: Not on file  Occupational History  . Not on file  Social Needs  . Financial resource strain: Not on file  . Food insecurity:    Worry: Not on file    Inability: Not on file  . Transportation needs:    Medical: Not on file    Non-medical: Not on file  Tobacco Use  . Smoking status: Former Smoker    Types: Cigarettes  . Smokeless tobacco: Never Used  . Tobacco comment: 30 years quit   Substance and  Sexual Activity  . Alcohol use: Yes    Alcohol/week: 0.0 standard drinks    Comment: OCCASIONALLY  . Drug use: No  . Sexual activity: Not on file  Lifestyle  . Physical activity:    Days per week: Not on file    Minutes per session: Not on file  . Stress: Not on file  Relationships  . Social connections:    Talks on phone: Not on file    Gets together: Not on file    Attends religious service: Not on file    Active member of club or organization: Not on file    Attends meetings of clubs or organizations: Not on file    Relationship status: Not on file  Other Topics Concern  . Not on file  Social History Narrative  . Not on file    Past Medical History:  Diagnosis Date  . Lumbar back pain   . Pre-diabetes    during pregnancy      Patient Active Problem List   Diagnosis Date Noted  . Plantar fasciitis of left foot 10/30/2017  . HNP (herniated nucleus pulposus), lumbar 10/16/2017  . Spinal stenosis of lumbar region 10/16/2017  . Spinal stenosis of lumbar region 10/16/2017  . Radiculopathy, lumbosacral region 09/02/2017  . H/O gestational diabetes mellitus, not currently pregnant 04/01/2015  . Benign neoplasm of colon 03/30/2015  . Dizziness 03/30/2015  . Abnormal maternal glucose tolerance, complicating pregnancy 92/42/6834  .  Hypercholesteremia 03/30/2015  . Cannot sleep 03/30/2015  . Borderline diabetes 03/30/2015  . RAD (reactive airway disease) 03/30/2015  . Arthralgia of temporomandibular joint 03/30/2015  . Avitaminosis D 03/30/2015  . H/O adenomatous polyp of colon 08/13/2014    Past Surgical History:  Procedure Laterality Date  . ABDOMINAL HYSTERECTOMY  1998  . LUMBAR LAMINECTOMY/DECOMPRESSION MICRODISCECTOMY Left 10/16/2017   Procedure: Microlumbar decompression L4-5, L5-S1 left;  Surgeon: Susa Day, MD;  Location: [facility];  Service: Orthopedics;  Laterality: Left;  120 mins  . RHINOPLASTY  1988  . SHOULDER SURGERY Right 2011  . [place]    Family History        Family Status  Relation Name Status  . Mother  Deceased  . Sister 1 Alive  . Brother 1 Alive  . Brother 2 Alive  . Daughter  (Not Specified)        Her family history includes Alcohol abuse in her mother; Breast cancer (age of onset: 60) in her mother; Hyperlipidemia in her brother, daughter, and sister. She was adopted.      Allergies  Allergen Reactions  . Other Itching    Seasonal   . Pravastatin Sodium     Other reaction(s): Joint Pains     Current Outpatient Medications:  .  albuterol (PROVENTIL HFA;VENTOLIN HFA) 108 (90 Base) MCG/ACT inhaler, Inhale 2 puffs into the lungs every 6 (six) hours as needed for wheezing or shortness of breath., Disp: 1 Inhaler, Rfl: 0 .  Ascorbic Acid (VITAMIN C) 1000 MG tablet, Take 1,000 mg by mouth daily. , Disp: , Rfl:  .  b complex vitamins tablet, Take 1 tablet by mouth daily., Disp: , Rfl:  .  Biotin 1000 MCG CHEW, Chew 1 each by mouth daily., Disp: , Rfl:  .  Cholecalciferol (VITAMIN D) 2000 UNITS CAPS, Take 2 capsules by mouth at bedtime. , Disp: , Rfl:  .  fluconazole (DIFLUCAN) 150 MG tablet, TAKE 1 TABLET BY MOUTH AS DIRECTED. REPEAT DOSE IN 3 DAYS IF SYMPTOMS PERSIST, Disp: , Rfl: 1 .  Melatonin 5 MG TABS, Take 1 tablet by mouth at bedtime., Disp: , Rfl:  .  Omega-3 Fatty Acids (FISH OIL MAXIMUM STRENGTH) 1200 MG CAPS, Take 3 capsules by mouth daily. , Disp: , Rfl:    Patient Care Team: Rubye Beach as PCP - General (Physician Assistant)      Objective:   Vitals: BP (!) 148/88 (BP Location: Left Arm, Patient Position: Sitting, Cuff Size: Normal)   Pulse 89   Temp 97.7 F (36.5 C) (Oral)   Resp 16   Ht 5\' 5"  (1.651 m)   Wt 190 lb 12.8 oz (86.5 kg)   SpO2 98%   BMI 31.75 kg/m    Vitals:   09/01/18 1553  BP: (!) 148/88  Pulse: 89  Resp: 16  Temp: 97.7 F (36.5 C)  TempSrc: Oral  SpO2: 98%  Weight: 190 lb 12.8 oz (86.5 kg)  Height: 5\' 5"  (1.651 m)      Physical Exam  Constitutional: She is oriented to person, place, and time. She appears well-developed and well-nourished.  HENT:  Head: Normocephalic.  Right Ear: External ear normal.  Left Ear: External ear normal.  Nose: Nose normal.  Mouth/Throat: Oropharynx is clear and moist.  Eyes: Pupils are equal, round, and reactive to light. Conjunctivae and EOM are normal.  Neck: Normal range of motion. Neck supple.  Cardiovascular: Normal rate, regular rhythm, normal heart sounds  and intact distal pulses.  Pulmonary/Chest: Effort normal and breath sounds normal.  Abdominal: Soft. Bowel sounds are normal.  Musculoskeletal: Normal range of motion.  Neurological: She is alert and oriented to person, place, and time.  Skin: Skin is warm and dry.  Psychiatric: She has a normal mood and affect. Her behavior is normal. Judgment and thought content normal.  Vitals reviewed.    Depression Screen PHQ 2/9 Scores 09/01/2018 08/15/2017  PHQ - 2 Score 0 0      Assessment & Plan:     Routine Health Maintenance and Physical Exam  Exercise Activities and Dietary recommendations Goals   None     Immunization History  Administered Date(s) Administered  . Influenza,inj,Quad PF,6+ Mos 08/15/2017  . Tdap 04/14/2012    Health Maintenance  Topic Date Due  . INFLUENZA VACCINE  05/08/2018  . MAMMOGRAM  04/17/2020  . PAP SMEAR  04/27/2021  . TETANUS/TDAP  04/14/2022  . COLONOSCOPY  08/13/2024  . Hepatitis C Screening  Completed  . HIV Screening  Completed     Discussed health benefits of physical activity, and encouraged her to engage in regular exercise appropriate for her age and condition.    1. Annual physical exam Normal exam today. Labs were done previously and reviewed today in the office. Biggest issue addressed today was increase in a1c from 5.6 to 6.3. Dietary and exercise habits discussed.   2. Breast cancer screening Done in June and normal. She is adopted but she  recently learned her birth mother had breast cancer and diabetes.   3. Colon cancer screening Was done in 2015 in [place]. Patient states polyps were found and she is to have repeated in 5 years, which will be next year. She is going to establish with someone locally.   4. Borderline diabetes A1c increase to 6.3.  5. Class 1 obesity due to excess calories with serious comorbidity and body mass index (BMI) of 31.0 to 31.9 in adult Counseled patient on healthy lifestyle modifications including dieting and exercise.   --------------------------------------------------------------------    Mar Daring, PA-C  [facility]

## 2018-09-02 ENCOUNTER — Encounter: Payer: Self-pay | Admitting: Physician Assistant

## 2018-09-02 DIAGNOSIS — Z803 Family history of malignant neoplasm of breast: Secondary | ICD-10-CM | POA: Insufficient documentation

## 2018-10-21 NOTE — Progress Notes (Signed)
Patient: Debra [name] Female    DOB: 05-20-1956   63 y.o.   MRN: [id number] Visit Date: 10/22/2018  Today's Provider: Mar Daring, PA-C   Chief Complaint  Patient presents with  . Neck Pain  . Back Pain  . Head Congestion  . chest hurts   Subjective:     HPI  Patient reports that she is hurting in the right side of her chest and right side of her back,upper. She does exercise but is not lifting any weights. She reports no injury. Reports that it hurts with coughing. Reports that it started with her neck hurting for the past week in half. No fever,chest pain,SOB. Treatment tried:none. She feels a lump right side of her neck, swollen lymph node. She is congested in her head.   Allergies  Allergen Reactions  . Other Itching    Seasonal   . Pravastatin Sodium     Other reaction(s): Joint Pains     Current Outpatient Medications:  .  albuterol (PROVENTIL HFA;VENTOLIN HFA) 108 (90 Base) MCG/ACT inhaler, Inhale 2 puffs into the lungs every 6 (six) hours as needed for wheezing or shortness of breath., Disp: 1 Inhaler, Rfl: 0 .  Ascorbic Acid (VITAMIN C) 1000 MG tablet, Take 1,000 mg by mouth daily. , Disp: , Rfl:  .  aspirin 81 MG tablet, Take 81 mg by mouth daily., Disp: , Rfl:  .  b complex vitamins tablet, Take 1 tablet by mouth daily., Disp: , Rfl:  .  Biotin 1000 MCG CHEW, Chew 1 each by mouth daily., Disp: , Rfl:  .  Cholecalciferol (VITAMIN D) 2000 UNITS CAPS, Take 2 capsules by mouth at bedtime. , Disp: , Rfl:  .  Melatonin 5 MG TABS, Take 1 tablet by mouth at bedtime., Disp: , Rfl:  .  Omega-3 Fatty Acids (FISH OIL MAXIMUM STRENGTH) 1200 MG CAPS, Take 3 capsules by mouth daily. , Disp: , Rfl:  .  fluconazole (DIFLUCAN) 150 MG tablet, TAKE 1 TABLET BY MOUTH AS DIRECTED. REPEAT DOSE IN 3 DAYS IF SYMPTOMS PERSIST, Disp: , Rfl: 1  Review of Systems  Constitutional: Negative for appetite change and fatigue.  HENT: Positive for congestion, postnasal drip and  sinus pressure. Negative for ear pain, hearing loss, rhinorrhea, sinus pain and sore throat.   Respiratory: Positive for cough. Negative for chest tightness, shortness of breath and wheezing.   Cardiovascular: Negative for chest pain and palpitations.  Gastrointestinal: Negative for abdominal pain, nausea and vomiting.  Musculoskeletal: Positive for neck stiffness.  Neurological: Negative for dizziness and weakness.    Social History   Tobacco Use  . Smoking status: Former Smoker    Types: Cigarettes  . Smokeless tobacco: Never Used  . Tobacco comment: 30 years quit   Substance Use Topics  . Alcohol use: Yes    Alcohol/week: 0.0 standard drinks    Comment: OCCASIONALLY      Objective:   BP (!) 150/84 (BP Location: Left Arm, Patient Position: Sitting, Cuff Size: Large)   Pulse 74   Temp 98.5 F (36.9 C) (Oral)   Resp 16   Wt 186 lb 3.2 oz (84.5 kg)   SpO2 97%   BMI 30.99 kg/m  Vitals:   10/22/18 1205  BP: (!) 150/84  Pulse: 74  Resp: 16  Temp: 98.5 F (36.9 C)  TempSrc: Oral  SpO2: 97%  Weight: 186 lb 3.2 oz (84.5 kg)     Physical Exam Vitals signs reviewed.  Constitutional:      General: She is not in acute distress.    Appearance: She is well-developed. She is not diaphoretic.  HENT:     Head: Normocephalic and atraumatic.     Right Ear: Hearing, tympanic membrane, ear canal and external ear normal.     Left Ear: Hearing, tympanic membrane, ear canal and external ear normal.     Nose: Septal deviation (h/o nasal fracture s/p surgical repair), mucosal edema and rhinorrhea present. Rhinorrhea is clear and purulent.     Mouth/Throat:     Pharynx: Oropharynx is clear. Uvula midline. Posterior oropharyngeal erythema (cobblestoning) present. No oropharyngeal exudate.  Eyes:     General: No scleral icterus.       Right eye: No discharge.        Left eye: No discharge.     Conjunctiva/sclera: Conjunctivae normal.     Pupils: Pupils are equal, round, and reactive  to light.  Neck:     Musculoskeletal: Neck supple. Decreased range of motion. Muscular tenderness (spasm noted in right upper trapezius) present. No spinous process tenderness.     Thyroid: No thyromegaly.     Trachea: No tracheal deviation.  Cardiovascular:     Rate and Rhythm: Normal rate and regular rhythm.     Heart sounds: Normal heart sounds. No murmur. No friction rub. No gallop.   Pulmonary:     Effort: Pulmonary effort is normal. No respiratory distress.     Breath sounds: Normal breath sounds. No stridor. No wheezing or rales.  Lymphadenopathy:     Cervical: No cervical adenopathy.  Skin:    General: Skin is warm and dry.        Assessment & Plan    1. Upper respiratory tract infection, unspecified type Worsening symptoms that have not responded to OTC medications. Will give augmentin as below. Continue allergy medications. Stay well hydrated and get plenty of rest. Call if no symptom improvement or if symptoms worsen. - amoxicillin-clavulanate (AUGMENTIN) 875-125 MG tablet; Take 1 tablet by mouth 2 (two) times daily.  Dispense: 20 tablet; Refill: 0  2. Cough Bromfed DM for cough prn.  - brompheniramine-pseudoephedrine-DM 30-2-10 MG/5ML syrup; Take 5 mLs by mouth 4 (four) times daily as needed.  Dispense: 120 mL; Refill: 0  3. Muscle spasm Baclofen for muscle spasm. Heating pad. Massage.  - baclofen (LIORESAL) 10 MG tablet; Take 1 tablet (10 mg total) by mouth 3 (three) times daily.  Dispense: 30 each; Refill: 0     Mar Daring, PA-C  [facility]

## 2018-10-22 ENCOUNTER — Ambulatory Visit (INDEPENDENT_AMBULATORY_CARE_PROVIDER_SITE_OTHER): Payer: BLUE CROSS/BLUE SHIELD | Admitting: Physician Assistant

## 2018-10-22 ENCOUNTER — Encounter: Payer: Self-pay | Admitting: Physician Assistant

## 2018-10-22 VITALS — BP 150/84 | HR 74 | Temp 98.5°F | Resp 16 | Wt 186.2 lb

## 2018-10-22 DIAGNOSIS — J069 Acute upper respiratory infection, unspecified: Secondary | ICD-10-CM | POA: Diagnosis not present

## 2018-10-22 DIAGNOSIS — M62838 Other muscle spasm: Secondary | ICD-10-CM | POA: Diagnosis not present

## 2018-10-22 DIAGNOSIS — R059 Cough, unspecified: Secondary | ICD-10-CM

## 2018-10-22 DIAGNOSIS — R05 Cough: Secondary | ICD-10-CM

## 2018-10-22 MED ORDER — BACLOFEN 10 MG PO TABS: 10.0000 mg | ORAL_TABLET | Freq: Three times a day (TID) | ORAL | 0 refills | Status: DC

## 2018-10-22 MED ORDER — PSEUDOEPH-BROMPHEN-DM 30-2-10 MG/5ML PO SYRP: 5.0000 mL | ORAL_SOLUTION | Freq: Four times a day (QID) | ORAL | 0 refills | Status: DC | PRN

## 2018-10-22 MED ORDER — AMOXICILLIN-POT CLAVULANATE 875-125 MG PO TABS: 1.0000 | ORAL_TABLET | Freq: Two times a day (BID) | ORAL | 0 refills | Status: DC

## 2018-10-22 NOTE — Patient Instructions (Signed)
Muscle Cramps and Spasms Muscle cramps and spasms are when muscles tighten by themselves. They usually get better within minutes. Muscle cramps are painful. They are usually stronger and last longer than muscle spasms. Muscle spasms may or may not be painful. They can last a few seconds or much longer. Cramps and spasms can affect any muscle, but they occur most often in the calf muscles of the leg. They are usually not caused by a serious problem. In many cases, the cause is not known. Some common causes include:  Doing more physical work or exercise than your body is ready for.  Using the muscles too much (overuse) by repeating certain movements too many times.  Staying in a certain position for a long time.  Playing a sport or doing an activity without preparing properly.  Using bad form or technique while playing a sport or doing an activity.  Not having enough water in your body (dehydration).  Injury.  Side effects of some medicines.  Low levels of the salts and minerals in your blood (electrolytes), such as low potassium or calcium. Follow these instructions at home: Managing pain and stiffness      Massage, stretch, and relax the muscle. Do this for many minutes at a time.  If told, put heat on tight or tense muscles as often as told by your doctor. Use the heat source that your doctor recommends, such as a moist heat pack or a heating pad. ? Place a towel between your skin and the heat source. ? Leave the heat on for 20-30 minutes. ? Remove the heat if your skin turns bright red. This is very important if you are not able to feel pain, heat, or cold. You may have a greater risk of getting burned.  If told, put ice on the affected area. This may help if you are sore or have pain after a cramp or spasm. ? Put ice in a plastic bag. ? Place a towel between your skin and the bag. ? Leave the ice on for 20 minutes, 2-3 times a day.  Try taking hot showers or baths to help  relax tight muscles. Eating and drinking  Drink enough fluid to keep your pee (urine) pale yellow.  Eat a healthy diet to help ensure that your muscles work well. This should include: ? Fruits and vegetables. ? Lean protein. ? Whole grains. ? Low-fat or nonfat dairy products. General instructions  If you are having cramps often, avoid intense exercise for several days.  Take over-the-counter and prescription medicines only as told by your doctor.  Watch for any changes in your symptoms.  Keep all follow-up visits as told by your doctor. This is important. Contact a doctor if:  Your cramps or spasms get worse or happen more often.  Your cramps or spasms do not get better with time. Summary  Muscle cramps and spasms are when muscles tighten by themselves. They usually get better within minutes.  Cramps and spasms occur most often in the calf muscles of the leg.  Massage, stretch, and relax the muscle. This may help the cramp or spasm go away.  Drink enough fluid to keep your pee (urine) pale yellow. This information is not intended to replace advice given to you by your health care provider. Make sure you discuss any questions you have with your health care provider. Document Released: 09/06/2008 Document Revised: 02/17/2018 Document Reviewed: 02/17/2018 Elsevier Interactive Patient Education  2019 [employer] Inc. Upper Respiratory Infection, Adult An  upper respiratory infection (URI) affects the nose, throat, and upper air passages. URIs are caused by germs (viruses). The most common type of URI is often called "the common cold." Medicines cannot cure URIs, but you can do things at home to relieve your symptoms. URIs usually get better within 7-10 days. Follow these instructions at home: Activity  Rest as needed.  If you have a fever, stay home from work or school until your fever is gone, or until your doctor says you may return to work or school. ? You should stay home  until you cannot spread the infection anymore (you are not contagious). ? Your doctor may have you wear a face mask so you have less risk of spreading the infection. Relieving symptoms  Gargle with a salt-water mixture 3-4 times a day or as needed. To make a salt-water mixture, completely dissolve -1 tsp of salt in 1 cup of warm water.  Use a cool-mist humidifier to add moisture to the air. This can help you breathe more easily. Eating and drinking   Drink enough fluid to keep your pee (urine) pale yellow.  Eat soups and other clear broths. General instructions   Take over-the-counter and prescription medicines only as told by your doctor. These include cold medicines, fever reducers, and cough suppressants.  Do not use any products that contain nicotine or tobacco. These include cigarettes and e-cigarettes. If you need help quitting, ask your doctor.  Avoid being where people are smoking (avoid secondhand smoke).  Make sure you get regular shots and get the flu shot every year.  Keep all follow-up visits as told by your doctor. This is important. How to avoid spreading infection to others   Wash your hands often with soap and water. If you do not have soap and water, use hand sanitizer.  Avoid touching your mouth, face, eyes, or nose.  Cough or sneeze into a tissue or your sleeve or elbow. Do not cough or sneeze into your hand or into the air. Contact a doctor if:  You are getting worse, not better.  You have any of these: ? A fever. ? Chills. ? Brown or red mucus in your nose. ? Yellow or brown fluid (discharge)coming from your nose. ? Pain in your face, especially when you bend forward. ? Swollen neck glands. ? Pain with swallowing. ? White areas in the back of your throat. Get help right away if:  You have shortness of breath that gets worse.  You have very bad or constant: ? Headache. ? Ear pain. ? Pain in your forehead, behind your eyes, and over your  cheekbones (sinus pain). ? Chest pain.  You have long-lasting (chronic) lung disease along with any of these: ? Wheezing. ? Long-lasting cough. ? Coughing up blood. ? A change in your usual mucus.  You have a stiff neck.  You have changes in your: ? Vision. ? Hearing. ? Thinking. ? Mood. Summary  An upper respiratory infection (URI) is caused by a germ called a virus. The most common type of URI is often called "the common cold."  URIs usually get better within 7-10 days.  Take over-the-counter and prescription medicines only as told by your doctor. This information is not intended to replace advice given to you by your health care provider. Make sure you discuss any questions you have with your health care provider. Document Released: 03/12/2008 Document Revised: 05/17/2017 Document Reviewed: 05/17/2017 Elsevier Interactive Patient Education  2019 [employer].

## 2019-01-14 ENCOUNTER — Encounter: Payer: Self-pay | Admitting: Physician Assistant

## 2019-01-14 ENCOUNTER — Ambulatory Visit (INDEPENDENT_AMBULATORY_CARE_PROVIDER_SITE_OTHER): Payer: BLUE CROSS/BLUE SHIELD | Admitting: Physician Assistant

## 2019-01-14 VITALS — Temp 98.1°F | Wt 182.0 lb

## 2019-01-14 DIAGNOSIS — T3695XA Adverse effect of unspecified systemic antibiotic, initial encounter: Secondary | ICD-10-CM | POA: Diagnosis not present

## 2019-01-14 DIAGNOSIS — J0120 Acute ethmoidal sinusitis, unspecified: Secondary | ICD-10-CM

## 2019-01-14 DIAGNOSIS — J4520 Mild intermittent asthma, uncomplicated: Secondary | ICD-10-CM | POA: Diagnosis not present

## 2019-01-14 DIAGNOSIS — B379 Candidiasis, unspecified: Secondary | ICD-10-CM

## 2019-01-14 MED ORDER — FLUCONAZOLE 150 MG PO TABS: ORAL_TABLET | ORAL | 0 refills | Status: DC

## 2019-01-14 MED ORDER — ALBUTEROL SULFATE HFA 108 (90 BASE) MCG/ACT IN AERS: 2.0000 | INHALATION_SPRAY | Freq: Four times a day (QID) | RESPIRATORY_TRACT | 0 refills | Status: DC | PRN

## 2019-01-14 MED ORDER — AMOXICILLIN-POT CLAVULANATE 875-125 MG PO TABS: 1.0000 | ORAL_TABLET | Freq: Two times a day (BID) | ORAL | 0 refills | Status: DC

## 2019-01-14 NOTE — Progress Notes (Signed)
Virtual Visit via Video Note  I connected with Debra [name] on 01/14/19 at  9:00 AM EDT by a video enabled telemedicine application and verified that I am speaking with the correct person using two identifiers.   I discussed the limitations of evaluation and management by telemedicine and the availability of in person appointments. The patient expressed understanding and agreed to proceed.   Mar Daring, PA-C   Patient: Debra [name] Female    DOB: 1956/01/08   63 y.o.   MRN: [id number] Visit Date: 01/14/2019  Today's Provider: Mar Daring, PA-C   Chief Complaint  Patient presents with   URI   Subjective:    Patient doing an e-visit. URI   This is a new problem. The current episode started in the past 7 days (Started on Monday). The problem has been unchanged. There has been no fever. Associated symptoms include chest pain ("right,side" patient reports that she gets this every season), congestion, rhinorrhea, sinus pain and sneezing. Pertinent negatives include no abdominal pain, coughing, ear pain, nausea, sore throat or wheezing. She has tried antihistamine (Flonase and Saline Solution) for the symptoms. The treatment provided no relief.     Allergies  Allergen Reactions   Other Itching    Seasonal    Pravastatin Sodium     Other reaction(s): Joint Pains     Current Outpatient Medications:    Ascorbic Acid (VITAMIN C) 1000 MG tablet, Take 1,000 mg by mouth daily. , Disp: , Rfl:    aspirin 81 MG tablet, Take 81 mg by mouth daily., Disp: , Rfl:    b complex vitamins tablet, Take 1 tablet by mouth daily., Disp: , Rfl:    Biotin 1000 MCG CHEW, Chew 1 each by mouth daily., Disp: , Rfl:    Cholecalciferol (VITAMIN D) 2000 UNITS CAPS, Take 2 capsules by mouth at bedtime. , Disp: , Rfl:    fluconazole (DIFLUCAN) 150 MG tablet, TAKE 1 TABLET BY MOUTH AS DIRECTED. REPEAT DOSE IN 3 DAYS IF SYMPTOMS PERSIST, Disp: , Rfl: 1   Melatonin 5 MG TABS, Take 1  tablet by mouth at bedtime., Disp: , Rfl:    Omega-3 Fatty Acids (FISH OIL MAXIMUM STRENGTH) 1200 MG CAPS, Take 3 capsules by mouth daily. , Disp: , Rfl:    albuterol (PROVENTIL HFA;VENTOLIN HFA) 108 (90 Base) MCG/ACT inhaler, Inhale 2 puffs into the lungs every 6 (six) hours as needed for wheezing or shortness of breath. (Patient not taking: Reported on 01/14/2019), Disp: 1 Inhaler, Rfl: 0   baclofen (LIORESAL) 10 MG tablet, Take 1 tablet (10 mg total) by mouth 3 (three) times daily. (Patient not taking: Reported on 01/14/2019), Disp: 30 each, Rfl: 0  Review of Systems  Constitutional: Negative.  Negative for fever.  HENT: Positive for congestion, postnasal drip, rhinorrhea, sinus pain and sneezing. Negative for ear pain, hearing loss, sinus pressure, sore throat, tinnitus and trouble swallowing.   Respiratory: Positive for shortness of breath ("when she walks"). Negative for cough, chest tightness and wheezing.   Cardiovascular: Positive for chest pain ("right,side" patient reports that she gets this every season). Negative for palpitations and leg swelling.  Gastrointestinal: Negative for abdominal pain and nausea.  Neurological: Negative.     Social History   Tobacco Use   Smoking status: Former Smoker    Types: Cigarettes   Smokeless tobacco: Never Used   Tobacco comment: 30 years quit   Substance Use Topics   Alcohol use: Yes  Alcohol/week: 0.0 standard drinks    Comment: OCCASIONALLY      Objective:   Wt 182 lb (82.6 kg)    BMI 30.29 kg/m  Vitals:   01/14/19 0847  Weight: 182 lb (82.6 kg)     Physical Exam Vitals signs reviewed.  Constitutional:      General: She is not in acute distress.    Appearance: Normal appearance. She is well-developed. She is not ill-appearing.  HENT:     Head: Normocephalic and atraumatic.     Comments: Reports tenderness to palpation of the ethmoid sinuses when she self palpates Neck:     Musculoskeletal: Normal range of motion and  neck supple.  Pulmonary:     Effort: Pulmonary effort is normal. No respiratory distress.  Neurological:     Mental Status: She is alert.  Psychiatric:        Behavior: Behavior normal.        Thought Content: Thought content normal.        Judgment: Judgment normal.         Assessment & Plan    1. Acute non-recurrent ethmoidal sinusitis Worsening symptoms that have not responded to OTC medications. Will give augmentin as below. Continue allergy medications. Stay well hydrated and get plenty of rest. Call if no symptom improvement or if symptoms worsen. - amoxicillin-clavulanate (AUGMENTIN) 875-125 MG tablet; Take 1 tablet by mouth 2 (two) times daily.  Dispense: 20 tablet; Refill: 0  2. Mild intermittent reactive airway disease without complication Stable. Diagnosis pulled for medication refill. Continue current medical treatment plan. - albuterol (PROVENTIL HFA;VENTOLIN HFA) 108 (90 Base) MCG/ACT inhaler; Inhale 2 puffs into the lungs every 6 (six) hours as needed for wheezing or shortness of breath.  Dispense: 1 Inhaler; Refill: 0  3. Antibiotic-induced yeast infection Gets yeast infections with antibiotics. Diflucan given as below.  - fluconazole (DIFLUCAN) 150 MG tablet; TAKE 1 TABLET BY MOUTH AS DIRECTED. REPEAT DOSE IN 3 DAYS IF SYMPTOMS PERSIST  Dispense: 2 tablet; Refill: 0  I discussed the assessment and treatment plan with the patient. The patient was provided an opportunity to ask questions and all were answered. The patient agreed with the plan and demonstrated an understanding of the instructions.   The patient was advised to call back or seek an in-person evaluation if the symptoms worsen or if the condition fails to improve as anticipated.  I provided 16 minutes of non-face-to-face time during this encounter.   Mar Daring, PA-C   Mar Daring, PA-C  [facility]

## 2019-02-11 ENCOUNTER — Telehealth: Payer: Self-pay | Admitting: *Deleted

## 2019-02-11 ENCOUNTER — Telehealth: Payer: Self-pay

## 2019-02-11 NOTE — Telephone Encounter (Signed)
Patient called and stated that her left ear is still bothering her. Patient states that it fills like fluid is in her ear, painful but not bad, achy and sounds like a echo. Patient states that she is currently taking her allergy medication but wants to know if she could get something send in to pharmacy or did [facility] want her to come in the office to be seen. Patient was seen via e-visit on 01/14/2019, please advise.

## 2019-02-11 NOTE — Telephone Encounter (Signed)
Patient was advised.  

## 2019-02-11 NOTE — Telephone Encounter (Signed)
LMTCB

## 2019-02-11 NOTE — Telephone Encounter (Signed)
She could try flonase and sudafed OTC x 1 week to see if that helps. If not, or if it worsens she would need to be seen to make sure it isn't just wax.

## 2019-02-11 NOTE — Telephone Encounter (Signed)
Error

## 2019-02-16 DIAGNOSIS — M48061 Spinal stenosis, lumbar region without neurogenic claudication: Secondary | ICD-10-CM | POA: Diagnosis not present

## 2019-02-16 DIAGNOSIS — M4316 Spondylolisthesis, lumbar region: Secondary | ICD-10-CM | POA: Diagnosis not present

## 2019-02-16 DIAGNOSIS — M5136 Other intervertebral disc degeneration, lumbar region: Secondary | ICD-10-CM | POA: Diagnosis not present

## 2019-03-09 DIAGNOSIS — G5603 Carpal tunnel syndrome, bilateral upper limbs: Secondary | ICD-10-CM | POA: Diagnosis not present

## 2019-03-31 DIAGNOSIS — R200 Anesthesia of skin: Secondary | ICD-10-CM | POA: Diagnosis not present

## 2019-03-31 DIAGNOSIS — R202 Paresthesia of skin: Secondary | ICD-10-CM | POA: Diagnosis not present

## 2019-04-20 ENCOUNTER — Other Ambulatory Visit: Payer: Self-pay | Admitting: Physician Assistant

## 2019-04-20 DIAGNOSIS — Z1231 Encounter for screening mammogram for malignant neoplasm of breast: Secondary | ICD-10-CM

## 2019-04-21 ENCOUNTER — Other Ambulatory Visit: Payer: Self-pay

## 2019-04-21 ENCOUNTER — Ambulatory Visit
Admission: RE | Admit: 2019-04-21 | Discharge: 2019-04-21 | Disposition: A | Discharge status: Home / Self Care | Payer: BLUE CROSS/BLUE SHIELD | Source: Ambulatory Visit | Attending: Physician Assistant | Admitting: Physician Assistant

## 2019-04-21 DIAGNOSIS — Z1231 Encounter for screening mammogram for malignant neoplasm of breast: Secondary | ICD-10-CM | POA: Diagnosis not present

## 2019-04-22 ENCOUNTER — Telehealth: Payer: Self-pay

## 2019-04-22 DIAGNOSIS — G5603 Carpal tunnel syndrome, bilateral upper limbs: Secondary | ICD-10-CM | POA: Diagnosis not present

## 2019-04-22 NOTE — Telephone Encounter (Signed)
Patient advised as directed below. 

## 2019-04-22 NOTE — Telephone Encounter (Signed)
-----   Message from Mar Daring, [place] sent at 04/21/2019  5:29 PM EDT ----- Normal mammogram. Repeat screening in one year.

## 2019-05-30 IMAGING — DX DG SPINE 1V PORT
1 series · 1 of 1 positions shown · non-contrast
Comparison: Intraoperative film 1.

CLINICAL DATA: Back pain.

EXAM:
PORTABLE SPINE - 1 VIEW

[l-spine lat]
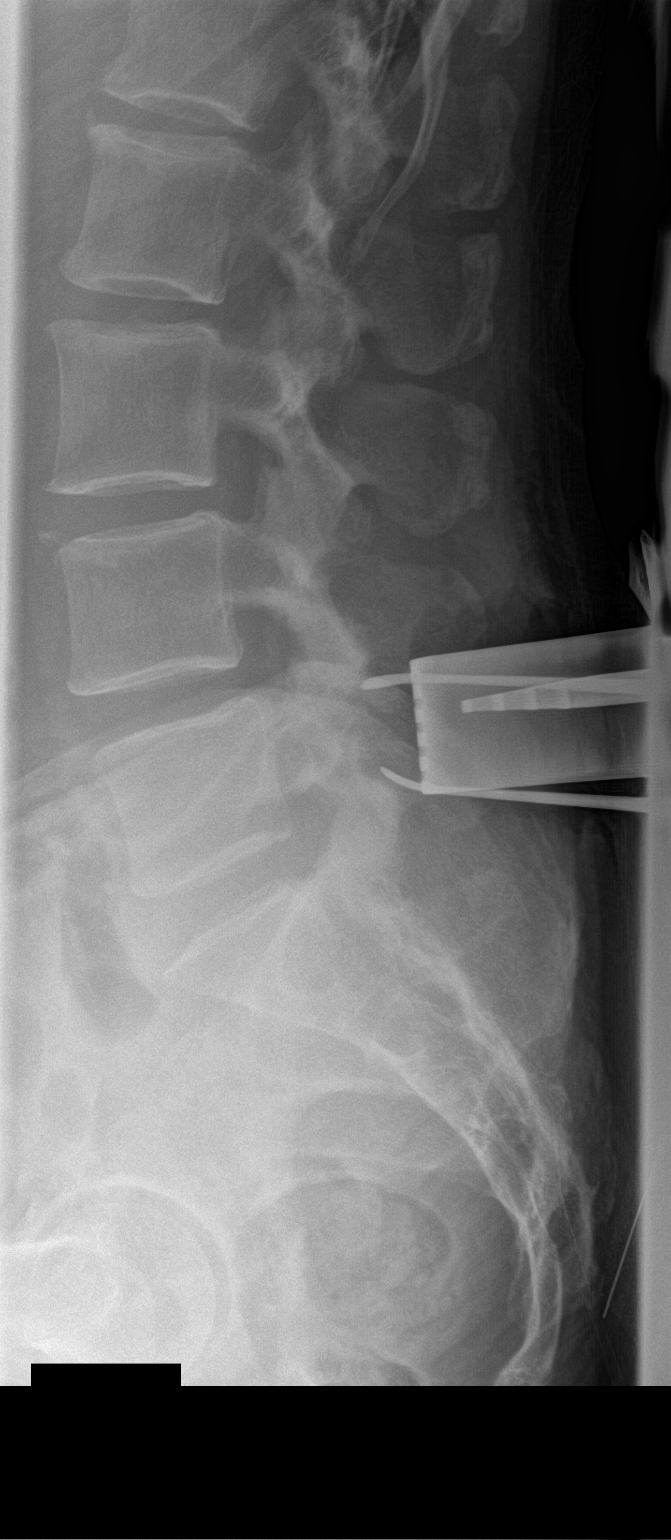

[1 of 1 positions shown; findings below may reference images not displayed]

FINDINGS: Intraoperative film 2 demonstrates probes from a posterior approach
lying above and below the L5 spinous process.
IMPRESSION: Posterior approach probes lie above and below the L5 spinous
process.

## 2019-07-08 ENCOUNTER — Telehealth (INDEPENDENT_AMBULATORY_CARE_PROVIDER_SITE_OTHER): Payer: BLUE CROSS/BLUE SHIELD | Admitting: Physician Assistant

## 2019-07-08 ENCOUNTER — Other Ambulatory Visit: Payer: Self-pay

## 2019-07-08 ENCOUNTER — Encounter: Payer: Self-pay | Admitting: Physician Assistant

## 2019-07-08 DIAGNOSIS — B379 Candidiasis, unspecified: Secondary | ICD-10-CM

## 2019-07-08 DIAGNOSIS — J0120 Acute ethmoidal sinusitis, unspecified: Secondary | ICD-10-CM | POA: Diagnosis not present

## 2019-07-08 DIAGNOSIS — T3695XA Adverse effect of unspecified systemic antibiotic, initial encounter: Secondary | ICD-10-CM | POA: Diagnosis not present

## 2019-07-08 MED ORDER — FLUCONAZOLE 150 MG PO TABS: ORAL_TABLET | ORAL | 0 refills | Status: DC

## 2019-07-08 MED ORDER — AMOXICILLIN-POT CLAVULANATE 875-125 MG PO TABS: 1.0000 | ORAL_TABLET | Freq: Two times a day (BID) | ORAL | 0 refills | Status: DC

## 2019-07-08 NOTE — Progress Notes (Signed)
Patient: Debra [name] Female    DOB: [birth year]   63 y.o.   MRN: [id number] Visit Date: 07/08/2019  Today's Provider: Mar Daring, PA-C   Chief Complaint  Patient presents with  . Otalgia   Subjective:      Virtual Visit via Video Note  I connected with [name] on 07/08/19 at  9:00 AM EDT by a video enabled telemedicine application and verified that I am speaking with the correct person using two identifiers.  Location: Patient: Home Provider: [facility]   I discussed the limitations of evaluation and management by telemedicine and the availability of in person appointments. The patient expressed understanding and agreed to proceed.   Sinusitis This is a new problem. The current episode started in the past 7 days. The problem has been gradually worsening since onset. There has been no fever. Her pain is at a severity of 2/10. The pain is mild. Associated symptoms include chills, congestion, ear pain (right ear), headaches and sinus pressure. Pertinent negatives include no coughing, diaphoresis, hoarse voice, neck pain, shortness of breath, sneezing, sore throat or swollen glands. Past treatments include saline sprays (loratadine and flonase). The treatment provided mild relief.    Allergies  Allergen Reactions  . Other Itching    Seasonal   . Pravastatin Sodium     Other reaction(s): Joint Pains     Current Outpatient Medications:  .  albuterol (PROVENTIL HFA;VENTOLIN HFA) 108 (90 Base) MCG/ACT inhaler, Inhale 2 puffs into the lungs every 6 (six) hours as needed for wheezing or shortness of breath., Disp: 1 Inhaler, Rfl: 0 .  amoxicillin-clavulanate (AUGMENTIN) 875-125 MG tablet, Take 1 tablet by mouth 2 (two) times daily., Disp: 20 tablet, Rfl: 0 .  Ascorbic Acid (VITAMIN C) 1000 MG tablet, Take 1,000 mg by mouth daily. , Disp: , Rfl:  .  aspirin 81 MG tablet, Take 81 mg by mouth daily., Disp: , Rfl:  .  b complex vitamins tablet, Take 1 tablet by mouth  daily., Disp: , Rfl:  .  Biotin 1000 MCG CHEW, Chew 1 each by mouth daily., Disp: , Rfl:  .  Cholecalciferol (VITAMIN D) 2000 UNITS CAPS, Take 2 capsules by mouth at bedtime. , Disp: , Rfl:  .  fluconazole (DIFLUCAN) 150 MG tablet, TAKE 1 TABLET BY MOUTH AS DIRECTED. REPEAT DOSE IN 3 DAYS IF SYMPTOMS PERSIST, Disp: 2 tablet, Rfl: 0 .  Melatonin 5 MG TABS, Take 1 tablet by mouth at bedtime., Disp: , Rfl:  .  Omega-3 Fatty Acids (FISH OIL MAXIMUM STRENGTH) 1200 MG CAPS, Take 3 capsules by mouth daily. , Disp: , Rfl:   Review of Systems  Constitutional: Positive for chills. Negative for diaphoresis, fatigue and fever.  HENT: Positive for congestion, ear pain (right ear), postnasal drip, sinus pressure and sinus pain. Negative for hoarse voice, sneezing and sore throat.   Respiratory: Negative for cough and shortness of breath.   Musculoskeletal: Negative for neck pain.  Neurological: Positive for headaches. Negative for dizziness and light-headedness.    Social History   Tobacco Use  . Smoking status: Former Smoker    Types: Cigarettes  . Smokeless tobacco: Never Used  . Tobacco comment: 30 years quit   Substance Use Topics  . Alcohol use: Yes    Alcohol/week: 0.0 standard drinks    Comment: OCCASIONALLY      Objective:   There were no vitals taken for this visit. There were no vitals filed for  this visit.There is no height or weight on file to calculate BMI.   Physical Exam Vitals signs reviewed.  Constitutional:      General: She is not in acute distress. Pulmonary:     Effort: Pulmonary effort is normal. No respiratory distress.  Neurological:     Mental Status: She is alert.      No results found for any visits on 07/08/19.     Assessment & Plan     1. Acute non-recurrent ethmoidal sinusitis Worsening symptoms that have not responded to OTC medications. Will give augmentin as below. Continue allergy medications. Stay well hydrated and get plenty of rest. Call if  no symptom improvement or if symptoms worsen. - amoxicillin-clavulanate (AUGMENTIN) 875-125 MG tablet; Take 1 tablet by mouth 2 (two) times daily.  Dispense: 20 tablet; Refill: 0  2. Antibiotic-induced yeast infection Gets yeast infections with antibiotics. Diflucan given as below.  - fluconazole (DIFLUCAN) 150 MG tablet; TAKE 1 TABLET BY MOUTH AS DIRECTED. REPEAT DOSE IN 3 DAYS IF SYMPTOMS PERSIST  Dispense: 2 tablet; Refill: 0   I discussed the assessment and treatment plan with the patient. The patient was provided an opportunity to ask questions and all were answered. The patient agreed with the plan and demonstrated an understanding of the instructions.   The patient was advised to call back or seek an in-person evaluation if the symptoms worsen or if the condition fails to improve as anticipated.  I provided 12 minutes of non-face-to-face time during this encounter.    Mar Daring, PA-C  [facility]

## 2019-08-19 ENCOUNTER — Telehealth: Payer: Self-pay

## 2019-08-19 ENCOUNTER — Encounter: Payer: Self-pay | Admitting: Family Medicine

## 2019-08-19 ENCOUNTER — Other Ambulatory Visit: Payer: Self-pay

## 2019-08-19 ENCOUNTER — Ambulatory Visit (INDEPENDENT_AMBULATORY_CARE_PROVIDER_SITE_OTHER): Payer: BLUE CROSS/BLUE SHIELD | Admitting: Family Medicine

## 2019-08-19 VITALS — BP 135/86 | HR 82 | Temp 97.5°F | Resp 16 | Ht 65.0 in | Wt 186.0 lb

## 2019-08-19 DIAGNOSIS — R0789 Other chest pain: Secondary | ICD-10-CM | POA: Diagnosis not present

## 2019-08-19 MED ORDER — OMEPRAZOLE 20 MG PO CPDR: 20.0000 mg | DELAYED_RELEASE_CAPSULE | Freq: Every day | ORAL | 3 refills | Status: DC

## 2019-08-19 NOTE — Patient Instructions (Signed)

## 2019-08-19 NOTE — Telephone Encounter (Signed)
Patient c/o chest pain, shortness of breath, and arm pain on and off x's 3 weeks. Patient reports last episode was last night, woke her up from her sleep. Patient reports episode lasted about one minute. Patient denies and nausea, vomiting or blurred vision. Patient reports that last night for dinner she did have enchiladas, patient also reports she started a new job and not sure if that has something to do with pain. Patient scheduled for 3:40 pm.

## 2019-08-19 NOTE — Telephone Encounter (Signed)
Noted  

## 2019-08-19 NOTE — Progress Notes (Signed)
Patient: Debra [name] Female    DOB: April 03, 1956   63 y.o.   MRN: [id number] Visit Date: 08/19/2019  Today's Provider: Lavon Paganini, MD   Chief Complaint  Patient presents with  . Chest Pain   Subjective:     HPI  Patient c/o chest pain, shortness of breath, and arm pain on and off x's 3 weeks. Patient reports last episode was last night, woke her up from her sleep. Patient reports episode lasted about one minute. Describes it as someone steeping on her chest.  Patient denies and nausea, vomiting or blurred vision. Patient reports that last night for dinner she did have enchiladas, patient also reports she started a new job and not sure if that has something to do with pain. Patient reports that today she did feel more fatigued, nausea, and feeling like his fingers are swollen.   Over the few years, was having episodes every 2-3 months, but over the last month has occurred weekly.  Now happening several times for week.  She does describe one episode of bending forward and feeling a burning sensation substernally that resolved with standing upright again.  Allergies  Allergen Reactions  . Other Itching    Seasonal   . Pravastatin Sodium     Other reaction(s): Joint Pains     Current Outpatient Medications:  .  albuterol (PROVENTIL HFA;VENTOLIN HFA) 108 (90 Base) MCG/ACT inhaler, Inhale 2 puffs into the lungs every 6 (six) hours as needed for wheezing or shortness of breath., Disp: 1 Inhaler, Rfl: 0 .  Ascorbic Acid (VITAMIN C) 1000 MG tablet, Take 1,000 mg by mouth daily. , Disp: , Rfl:  .  aspirin 81 MG tablet, Take 81 mg by mouth daily., Disp: , Rfl:  .  Biotin 1000 MCG CHEW, Chew 1 each by mouth daily., Disp: , Rfl:  .  Cholecalciferol (VITAMIN D) 2000 UNITS CAPS, Take 2 capsules by mouth at bedtime. , Disp: , Rfl:  .  Melatonin 5 MG TABS, Take 1 tablet by mouth at bedtime., Disp: , Rfl:  .  Omega-3 Fatty Acids (FISH OIL MAXIMUM STRENGTH) 1200 MG CAPS, Take 3  capsules by mouth daily. , Disp: , Rfl:   Review of Systems  Constitutional: Positive for fatigue.  Respiratory: Positive for shortness of breath.   Cardiovascular: Positive for chest pain.  Gastrointestinal: Positive for nausea.  Neurological: Positive for light-headedness and headaches.    Social History   Tobacco Use  . Smoking status: Former Smoker    Types: Cigarettes  . Smokeless tobacco: Never Used  . Tobacco comment: 30 years quit   Substance Use Topics  . Alcohol use: Yes    Alcohol/week: 0.0 standard drinks    Comment: OCCASIONALLY      Objective:   BP 135/86 (BP Location: Left Arm, Patient Position: Sitting, Cuff Size: Large)   Pulse 82   Temp (!) 97.5 F (36.4 C) (Temporal)   Resp 16   Ht 5\' 5"  (1.651 m)   Wt 186 lb (84.4 kg)   BMI 30.95 kg/m  Vitals:   08/19/19 1553  BP: 135/86  Pulse: 82  Resp: 16  Temp: (!) 97.5 F (36.4 C)  TempSrc: Temporal  Weight: 186 lb (84.4 kg)  Height: 5\' 5"  (1.651 m)  Body mass index is 30.95 kg/m.   Physical Exam Vitals signs reviewed.  Constitutional:      General: She is not in acute distress.    Appearance: Normal appearance. She  is well-developed. She is not diaphoretic.  HENT:     Head: Normocephalic and atraumatic.  Eyes:     General: No scleral icterus.    Conjunctiva/sclera: Conjunctivae normal.  Neck:     Musculoskeletal: Neck supple.     Thyroid: No thyromegaly.  Cardiovascular:     Rate and Rhythm: Normal rate and regular rhythm.     Pulses: Normal pulses.     Heart sounds: Normal heart sounds. No murmur.  Pulmonary:     Effort: Pulmonary effort is normal. No respiratory distress.     Breath sounds: Normal breath sounds. No wheezing, rhonchi or rales.  Abdominal:     General: There is no distension.     Palpations: Abdomen is soft.     Tenderness: There is no abdominal tenderness.  Musculoskeletal:     Right lower leg: No edema.     Left lower leg: No edema.  Lymphadenopathy:     Cervical:  No cervical adenopathy.  Skin:    General: Skin is warm and dry.     Capillary Refill: Capillary refill takes less than 2 seconds.     Findings: No rash.  Neurological:     Mental Status: She is alert and oriented to person, place, and time. Mental status is at baseline.  Psychiatric:        Mood and Affect: Mood normal.        Behavior: Behavior normal.     EKG: NSR, no signs of ischemia, unchanged from previous  Assessment & Plan   1. Other chest pain - recurrent and worsening problem - EKG wnl and reassuring - reviewed with patient in the room - discussed that history is not consistent with cardiac etiology - reassurance given - discussed that stress/anxiety can contribute to somatic symptoms such as this, but 2 episodes described sound more consistent with GERD/esophageal spasm - will start trial of omeprazole - discussed lifestyle changes - discussed relaxation/stress management techniques - discussed return precautions - EKG 12-Lead   Meds ordered this encounter  Medications  . omeprazole (PRILOSEC) 20 MG capsule    Sig: Take 1 capsule (20 mg total) by mouth daily.    Dispense:  30 capsule    Refill:  3     Return if symptoms worsen or fail to improve.   The entirety of the information documented in the History of Present Illness, Review of Systems and Physical Exam were personally obtained by me. Portions of this information were initially documented by Lynford Humphrey, CMA and reviewed by me for thoroughness and accuracy.    , Dionne Bucy, MD MPH [facility]

## 2019-08-27 ENCOUNTER — Telehealth: Payer: Self-pay

## 2019-08-27 NOTE — Telephone Encounter (Signed)
Spoke with Debra [name] and got insurance corrected and bill was resubmitted. cbe

## 2019-08-27 NOTE — Telephone Encounter (Signed)
Copied from [place] [id number]. Topic: General - Call Back - No Documentation >> Aug 27, 2019 10:27 AM Sheran Luz wrote: Patient states she was speaking with someone in office about resubmitting claim for last appointment on file. She is requesting that be resubmitted. No documentation found about previous conversation with patient.

## 2019-09-10 ENCOUNTER — Telehealth: Payer: Self-pay | Admitting: Physician Assistant

## 2019-09-10 NOTE — Telephone Encounter (Signed)
Done

## 2019-09-10 NOTE — Telephone Encounter (Signed)
From [facility] 

## 2019-09-10 NOTE — Telephone Encounter (Signed)
Patient called and would like a copy of her vaccines and would like to come by today after lunch. Please call patient if this is possible, thanks.

## 2019-09-11 ENCOUNTER — Telehealth: Payer: Self-pay | Admitting: Physician Assistant

## 2019-09-11 DIAGNOSIS — S39012A Strain of muscle, fascia and tendon of lower back, initial encounter: Secondary | ICD-10-CM

## 2019-09-11 MED ORDER — MELOXICAM 7.5 MG PO TABS: 7.5000 mg | ORAL_TABLET | Freq: Every day | ORAL | 0 refills | Status: DC

## 2019-09-11 NOTE — Telephone Encounter (Signed)
Meloxicam 7.5mg  sent to [facility]. Needs to take with food in the morning as needed for back pain to protect stomach.

## 2019-09-11 NOTE — Telephone Encounter (Signed)
Pt called and stated that she would like an RX for Meloxicam. Pt states that she picked her grandson up and she is now having back pain. I suggested appointment but she states that she does not have a job until January and can not cover co pay. Pt would like a call back from the nurse regarding. Please advise

## 2019-09-11 NOTE — Telephone Encounter (Signed)
Patient advised and verbally voiced understanding.  

## 2019-09-16 ENCOUNTER — Ambulatory Visit (INDEPENDENT_AMBULATORY_CARE_PROVIDER_SITE_OTHER): Payer: Self-pay | Admitting: Family Medicine

## 2019-09-16 ENCOUNTER — Encounter: Payer: Self-pay | Admitting: Family Medicine

## 2019-09-16 DIAGNOSIS — K219 Gastro-esophageal reflux disease without esophagitis: Secondary | ICD-10-CM

## 2019-09-16 DIAGNOSIS — I10 Essential (primary) hypertension: Secondary | ICD-10-CM

## 2019-09-16 MED ORDER — HYDROCHLOROTHIAZIDE 12.5 MG PO TABS: 12.5000 mg | ORAL_TABLET | Freq: Every day | ORAL | 3 refills | Status: DC

## 2019-09-16 NOTE — Progress Notes (Signed)
Patient: Debra [name] Female    DOB: 03/17/1956   63 y.o.   MRN: [id number] Visit Date: 09/16/2019  Today's Provider: Lavon Paganini, MD   Chief Complaint  Patient presents with  . Hypertension   Subjective:    I [place] S. Dimas, CMA, am acting as scribe for Lavon Paganini, MD. Virtual Visit via Telephone Note  I connected with Debra [name] on 09/16/19 at 10:00 AM EST by telephone and verified that I am speaking with the correct person using two identifiers.  Location:  Patient location: home Provider location: [facility] Persons involved in the visit: patient, provider   I discussed the limitations, risks, security and privacy concerns of performing an evaluation and management service by telephone and the availability of in person appointments. I also discussed with the patient that there may be a patient responsible charge related to this service. The patient expressed understanding and agreed to proceed.   HPI Patient reports that she had a health assessment at [facility] for a new job on Monday. Patient reports elevated blood pressure readings at the assessment. Patient reports bp was checked three times systolic reading [id number] and diastolic in the [id number]. Patient reports she took her blood pressure yesterday morning and afternoon 157/82 164/86. Patient denies any symptoms like chest pain, shortness of breath, swelling around her feet or ankle. Patient reports that hypertension does run in her family (3 siblings all take antihypertensives.   She has never taken anything for HTN.  Omeprazole has worked well for her chest pain episodes.   Allergies  Allergen Reactions  . Other Itching    Seasonal   . Pravastatin Sodium     Other reaction(s): Joint Pains     Current Outpatient Medications:  .  Ascorbic Acid (VITAMIN C) 1000 MG tablet, Take 1,000 mg by mouth daily. , Disp: , Rfl:  .  aspirin 81 MG tablet, Take 81 mg by mouth daily., Disp:  , Rfl:  .  Biotin 1000 MCG CHEW, Chew 1 each by mouth daily., Disp: , Rfl:  .  Cholecalciferol (VITAMIN D) 2000 UNITS CAPS, Take 2 capsules by mouth at bedtime. , Disp: , Rfl:  .  Melatonin 5 MG TABS, Take 1 tablet by mouth at bedtime., Disp: , Rfl:  .  meloxicam (MOBIC) 7.5 MG tablet, Take 1 tablet (7.5 mg total) by mouth daily., Disp: 30 tablet, Rfl: 0 .  omeprazole (PRILOSEC) 20 MG capsule, Take 1 capsule (20 mg total) by mouth daily., Disp: 30 capsule, Rfl: 3 .  albuterol (PROVENTIL HFA;VENTOLIN HFA) 108 (90 Base) MCG/ACT inhaler, Inhale 2 puffs into the lungs every 6 (six) hours as needed for wheezing or shortness of breath. (Patient not taking: Reported on 09/16/2019), Disp: 1 Inhaler, Rfl: 0  Review of Systems  Eyes: Negative for visual disturbance.  Respiratory: Negative for shortness of breath.   Cardiovascular: Negative for chest pain and leg swelling.  Neurological: Negative for dizziness and light-headedness.    Social History   Tobacco Use  . Smoking status: Former Smoker    Types: Cigarettes  . Smokeless tobacco: Never Used  . Tobacco comment: 30 years quit   Substance Use Topics  . Alcohol use: Yes    Alcohol/week: 0.0 standard drinks    Comment: OCCASIONALLY      Objective:   BP (!) 164/86 (BP Location: Right Arm, Patient Position: Sitting, Cuff Size: Normal)   Ht 5\' 5"  (1.651 m)   Wt 182 lb (  82.6 kg)   BMI 30.29 kg/m  Vitals:   09/16/19 0923  BP: (!) 164/86  Weight: 182 lb (82.6 kg)  Height: 5\' 5"  (1.651 m)  Body mass index is 30.29 kg/m.   Physical Exam Speaking in full sentences, in NAD  No results found for any visits on 09/16/19.     Assessment & Plan     I discussed the assessment and treatment plan with the patient. The patient was provided an opportunity to ask questions and all were answered. The patient agreed with the plan and demonstrated an understanding of the instructions.   The patient was advised to call back or seek an in-person  evaluation if the symptoms worsen or if the condition fails to improve as anticipated.  Problem List Items Addressed This Visit      Cardiovascular and Mediastinum   Essential hypertension    New diagnosis Uncontrolled in several instances recently Looking back in her records, seems that she has been intermittently uncontrolled in the past as well Discussed lifestyle changes, such as regular exercise and low-sodium diet We will go ahead and start HCTZ 12.5 mg daily Discussed possible side effects Plan to check metabolic panel at upcoming in-person visit Follow-up in 1 month and consider dose titration      Relevant Medications   hydrochlorothiazide (HYDRODIURIL) 12.5 MG tablet     Digestive   GERD (gastroesophageal reflux disease)    Symptoms improved significantly with Omeprazole - will continue          Return in about 4 weeks (around 10/14/2019) for BP f/u - needs labs.   The entirety of the information documented in the History of Present Illness, Review of Systems and Physical Exam were personally obtained by me. Portions of this information were initially documented by Lynford Humphrey, CMA and reviewed by me for thoroughness and accuracy.    , Dionne Bucy, MD MPH [facility]

## 2019-09-16 NOTE — Assessment & Plan Note (Signed)
New diagnosis Uncontrolled in several instances recently Looking back in her records, seems that she has been intermittently uncontrolled in the past as well Discussed lifestyle changes, such as regular exercise and low-sodium diet We will go ahead and start HCTZ 12.5 mg daily Discussed possible side effects Plan to check metabolic panel at upcoming in-person visit Follow-up in 1 month and consider dose titration

## 2019-09-16 NOTE — Assessment & Plan Note (Signed)
Symptoms improved significantly with Omeprazole - will continue

## 2019-09-25 ENCOUNTER — Telehealth: Payer: Self-pay | Admitting: Physician Assistant

## 2019-09-25 NOTE — Telephone Encounter (Signed)
Pt stated she was supposed to call back with BP readings after taking new BP meds. Please advise.  09/16/19 9:52am   153/76  09/18/19 8:50am 174/91  09/21/19 6:10am 155/79  09/23/19 8:35am 171/80  09/25/19 6:05am 159/94

## 2019-09-25 NOTE — Telephone Encounter (Signed)
Have her increase to 2 HCTZ tabs (take together). Call back next weds with readings.

## 2019-09-28 NOTE — Telephone Encounter (Signed)
Pt. returned call.  Advised of recommendation by Fenton Malling to increase HCTZ to 2 tablets qd, and to be taken at same time, and to call back next Wed. with BP readings.  Pt. Verb. Understanding; agreed with plan.

## 2019-09-28 NOTE — Telephone Encounter (Signed)
LMTCB-Ok for the PEC to relay message.

## 2019-10-06 ENCOUNTER — Other Ambulatory Visit: Payer: Self-pay | Admitting: Physician Assistant

## 2019-10-06 ENCOUNTER — Telehealth: Payer: Self-pay

## 2019-10-06 DIAGNOSIS — I10 Essential (primary) hypertension: Secondary | ICD-10-CM

## 2019-10-06 MED ORDER — HYDROCHLOROTHIAZIDE 12.5 MG PO TABS: 12.5000 mg | ORAL_TABLET | Freq: Every day | ORAL | 0 refills | Status: DC

## 2019-10-06 MED ORDER — HYDROCHLOROTHIAZIDE 25 MG PO TABS: 25.0000 mg | ORAL_TABLET | Freq: Every day | ORAL | 0 refills | Status: DC

## 2019-10-06 MED ORDER — LISINOPRIL 10 MG PO TABS: 10.0000 mg | ORAL_TABLET | Freq: Every day | ORAL | 0 refills | Status: DC

## 2019-10-06 NOTE — Telephone Encounter (Signed)
Copied from [place] [id number]. Topic: General - Inquiry >> Oct 06, 2019  2:07 PM Richardo Priest, NT wrote: Reason for CRM: Pt called in stating she was told to give BP readings since increasing dosage.  12/22: 181/96 12/23:158/79 12/24: 162/81 12/26: 148/75 (skipped 25th) 12/27: 143/82 12/28: 151/83 Please advise.

## 2019-10-06 NOTE — Telephone Encounter (Signed)
Medication Refill - Medication:  hydrochlorothiazide (HYDRODIURIL) 12.5 MG tablet  Has the patient contacted their pharmacy? Yes advised to call office as pt is now taking 2 pills instead of one.  Preferred Pharmacy (with phone number or street name):  [id number] [place], [place] Phone:  [phone]  Fax:  [phone]     Agent: Please be advised that RX refills may take up to 3 business days. We ask that you follow-up with your pharmacy.

## 2019-10-06 NOTE — Addendum Note (Signed)
Addended by: Mar Daring on: 10/06/2019 04:49 PM   Modules accepted: Orders

## 2019-10-06 NOTE — Telephone Encounter (Signed)
Requested medication (s) are due for refill today: yes  Requested medication (s) are on the active medication list: yes  Last refill: 09/16/2019  Future visit scheduled: no  Notes to clinic: review for refill Looks like some changes were made on patient medication and they were suppose to follow up Patient only has a few pills left    Requested Prescriptions  Pending Prescriptions Disp Refills   hydrochlorothiazide (HYDRODIURIL) 12.5 MG tablet 30 tablet 3    Sig: Take 1 tablet (12.5 mg total) by mouth daily.      Cardiovascular: Diuretics - Thiazide Failed - 10/06/2019  2:15 PM      Failed - Ca in normal range and within 360 days    Calcium  Date Value Ref Range Status  08/27/2018 9.8 8.7 - 10.3 mg/dL Final          Failed - Cr in normal range and within 360 days    Creat  Date Value Ref Range Status  08/16/2017 0.80 0.50 - 1.05 mg/dL Final    Comment:    For patients >50 years of age, the reference limit for Creatinine is approximately 13% higher for people identified as African-American. .    Creatinine, Ser  Date Value Ref Range Status  08/27/2018 0.65 0.57 - 1.00 mg/dL Final          Failed - K in normal range and within 360 days    Potassium  Date Value Ref Range Status  08/27/2018 4.1 3.5 - 5.2 mmol/L Final          Failed - Na in normal range and within 360 days    Sodium  Date Value Ref Range Status  08/27/2018 139 134 - 144 mmol/L Final          Failed - Last BP in normal range    BP Readings from Last 1 Encounters:  09/16/19 (!) 164/86          Passed - Valid encounter within last 6 months    Recent Outpatient Visits           2 weeks ago Gastroesophageal reflux disease, unspecified whether esophagitis present   [place], Dionne Bucy, MD   1 month ago Other chest pain   [facility] [place], Dionne Bucy, MD   3 months ago Acute non-recurrent ethmoidal sinusitis   [place], [place]   8 months ago Acute non-recurrent ethmoidal sinusitis   [place], [place]   11 months ago Upper respiratory tract infection, unspecified type   [facility], [place], [place]

## 2019-10-06 NOTE — Telephone Encounter (Signed)
Will send in Lisinopril 10mg  to take with the HCTZ 25mg  she is currently taking. If this lowers her BP enough and she tolerates these come in a combination pill I can send in for future.  Call with BP readings again in 2 weeks or so.

## 2019-10-07 NOTE — Telephone Encounter (Signed)
Patient advised and verbally voiced understanding.  

## 2019-10-08 ENCOUNTER — Telehealth: Payer: Self-pay | Admitting: Physician Assistant

## 2019-10-08 ENCOUNTER — Other Ambulatory Visit: Payer: Self-pay | Admitting: Physician Assistant

## 2019-10-08 DIAGNOSIS — S39012A Strain of muscle, fascia and tendon of lower back, initial encounter: Secondary | ICD-10-CM

## 2019-10-08 DIAGNOSIS — I10 Essential (primary) hypertension: Secondary | ICD-10-CM

## 2019-10-08 NOTE — Telephone Encounter (Signed)
Pt states she cannot take the lisinopril (ZESTRIL) 10 MG tablet  It is making a tickle in her throat and nausea.  Pt states she is starting at the hospital on Monday and they would mot appreciate her coughing and clearing her throat all the time.  [id number] [place], [place] Phone:  [phone]  Fax:  [phone]

## 2019-10-12 MED ORDER — LOSARTAN POTASSIUM 50 MG PO TABS: 50.0000 mg | ORAL_TABLET | Freq: Every day | ORAL | 0 refills | Status: DC

## 2019-10-12 NOTE — Telephone Encounter (Signed)
Advised 

## 2019-10-12 NOTE — Telephone Encounter (Signed)
LMTCB 10/12/2019  Thanks,   -Mickel Baas

## 2019-10-12 NOTE — Telephone Encounter (Signed)
Stop lisinopril and start losartan. Continue HCTZ as well.

## 2019-10-12 NOTE — Telephone Encounter (Signed)
From [facility] 

## 2019-10-29 ENCOUNTER — Other Ambulatory Visit: Payer: Self-pay | Admitting: Physician Assistant

## 2019-10-29 DIAGNOSIS — I10 Essential (primary) hypertension: Secondary | ICD-10-CM

## 2019-10-30 ENCOUNTER — Telehealth: Payer: Self-pay | Admitting: Physician Assistant

## 2019-10-30 ENCOUNTER — Other Ambulatory Visit: Payer: Self-pay | Admitting: Physician Assistant

## 2019-10-30 DIAGNOSIS — I10 Essential (primary) hypertension: Secondary | ICD-10-CM

## 2019-10-30 NOTE — Telephone Encounter (Signed)
Patient is calling to report the following BP readings.  Patient started taking losartan. Patient reports no headaches  10/25/19-131/73 10/26/19-157/68 10/27/19-151/78 10/28/19-125/72 10/29/19-127/60  Please advise CB- [phone]

## 2019-10-30 NOTE — Telephone Encounter (Signed)
LMTCB.  Okay for PEC to give information below.   Thanks,   -Mickel Baas

## 2019-10-30 NOTE — Telephone Encounter (Signed)
These readings are much better. If we can keep the averages more in the 120s-130s/60-70s I think we are on a good track.   Would she like for me to send losartan and HCTZ in as a combination pill so she only has to take one daily instead of both separately?

## 2019-11-02 MED ORDER — LOSARTAN POTASSIUM 50 MG PO TABS: 50.0000 mg | ORAL_TABLET | Freq: Every day | ORAL | 3 refills | Status: DC

## 2019-11-02 MED ORDER — HYDROCHLOROTHIAZIDE 25 MG PO TABS: 25.0000 mg | ORAL_TABLET | Freq: Every day | ORAL | 3 refills | Status: DC

## 2019-11-02 NOTE — Telephone Encounter (Signed)
Pt stated she does want the HCTZ Combination rx sent to her pharmacy.  [facility] [place]  [id number] [place] [place] Phone:  [phone]  Fax:  [phone]

## 2019-11-02 NOTE — Telephone Encounter (Signed)
Losartan and HCTZ sent in for patient

## 2019-11-02 NOTE — Telephone Encounter (Signed)
LMTCB- Does the patient want provider to send for her the Losartan and HCTZ in as a combination pill? If not she send in HCTZ 10/29/19 to the pharmacy. If the pharmacy didn't receive it we can go ahead and resend for her. If patient calls back please ask her which she prefers. Thanks.OK for the [facility] to give message

## 2019-11-02 NOTE — Telephone Encounter (Signed)
Patient is calling to ask Tawanna Sat If Should could send the HCT[facility] to [facility] [place]. Please advise Cb- [phone]

## 2019-11-06 ENCOUNTER — Ambulatory Visit: Payer: Self-pay | Admitting: *Deleted

## 2019-11-06 DIAGNOSIS — I10 Essential (primary) hypertension: Secondary | ICD-10-CM

## 2019-11-06 NOTE — Telephone Encounter (Signed)
Summary: medication side effect   Pt states that she started losartan potassium (combined) last night. States that today she is very tired, back hurting, muscle ache, pt is getting winded easier with walking and feels exhausted. Pt wants to know if this is normal and if so how long will this take to go away.      Patient states she has changes to a combination BP medication- but it should be the same as what she was taking previously in 2 separate pills. Patient is having symptoms she thinks could be side effects of medication. Call to office- note requested for PCP to review.  Reason for Disposition . [1] Caller has URGENT medication question about med that PCP or specialist prescribed AND [2] triager unable to answer question  Answer Assessment - Initial Assessment Questions 1.   NAME of MEDICATION: "What medicine are you calling about?"     Losartan potassium 2.   QUESTION: "What is your question?"     Started combined medication and she is having strange symptoms now 3.   PRESCRIBING HCP: "Who prescribed it?" Reason: if prescribed by specialist, call should be referred to that group.     Joette Catching, PA 4. SYMPTOMS: "Do you have any symptoms?"     Tired, back pain, exhausted 5. SEVERITY: If symptoms are present, ask "Are they mild, moderate or severe?"     mild 6.  PREGNANCY:  "Is there any chance that you are pregnant?" "When was your last menstrual period?"     n/a  Protocols used: MEDICATION QUESTION CALL-A-AH

## 2019-11-09 MED ORDER — LOSARTAN POTASSIUM 50 MG PO TABS: 50.0000 mg | ORAL_TABLET | Freq: Every day | ORAL | 3 refills | Status: DC

## 2019-11-09 MED ORDER — HYDROCHLOROTHIAZIDE 25 MG PO TABS: 25.0000 mg | ORAL_TABLET | Freq: Every day | ORAL | 3 refills | Status: DC

## 2019-11-09 NOTE — Telephone Encounter (Signed)
Sent in separately

## 2019-11-09 NOTE — Telephone Encounter (Signed)
It is possible. If she can try the medication x 1 week to see if improves. If not, or if worsens, stop the medication and call or mychart message and will change back to separate medications.

## 2019-11-09 NOTE — Telephone Encounter (Signed)
Patient reports that her symptoms were worsening, patient reports she was having nausea and vomiting after taking medication. Patient reports that last night she did not take medication and today she is feeling fine. Patient would like to go back taking medications separate. [facility] [place] Please advise.

## 2019-11-09 NOTE — Addendum Note (Signed)
Addended by: Mar Daring on: 11/09/2019 09:55 AM   Modules accepted: Orders

## 2019-11-24 ENCOUNTER — Telehealth: Payer: Self-pay | Admitting: Physician Assistant

## 2019-11-24 DIAGNOSIS — H66001 Acute suppurative otitis media without spontaneous rupture of ear drum, right ear: Secondary | ICD-10-CM

## 2019-11-24 NOTE — Telephone Encounter (Signed)
Copied from [place] [id number]. Topic: General - Other >> Nov 24, 2019 10:09 AM Keene Breath wrote: Reason for CRM: Patient called to request some medication for an ear infection in her right ear.  She stated that she gets them all the time and would like something called into her local pharmacy.  Please advise and call patient back to confirm at [phone]

## 2019-11-24 NOTE — Telephone Encounter (Signed)
Please review

## 2019-11-27 MED ORDER — OFLOXACIN 0.3 % OT SOLN: 5.0000 [drp] | Freq: Every day | OTIC | 0 refills | Status: DC

## 2019-11-27 NOTE — Telephone Encounter (Signed)
Ofloxacin drops sent in

## 2019-11-27 NOTE — Telephone Encounter (Signed)
Patient is calling back to check on the below status of her request. Patient is not interested in scheduling an appt. Patient is wanting an antibotic to be called in for her. Patient was last seen virtually 06/2019 for this issue. Patient is requesting a call back

## 2019-11-27 NOTE — Telephone Encounter (Signed)
Pt advised.   Thanks,   -Daylen Hack  

## 2019-12-05 ENCOUNTER — Other Ambulatory Visit: Payer: Self-pay | Admitting: Family Medicine

## 2019-12-05 NOTE — Telephone Encounter (Signed)
Requested Prescriptions  Pending Prescriptions Disp Refills  . omeprazole (PRILOSEC) 20 MG capsule [Pharmacy Med Name: OMEPRAZOLE DR 20 MG CAPSULE] 90 capsule 1    Sig: TAKE 1 CAPSULE BY MOUTH EVERY DAY     Gastroenterology: Proton Pump Inhibitors Passed - 12/05/2019 12:47 AM      Passed - Valid encounter within last 12 months    Recent Outpatient Visits          2 months ago Gastroesophageal reflux disease, unspecified whether esophagitis present   [facility], Dionne Bucy, MD   3 months ago Other chest pain   [facility] [place], Dionne Bucy, MD   5 months ago Acute non-recurrent ethmoidal sinusitis   [place], [place]   10 months ago Acute non-recurrent ethmoidal sinusitis   [place], [place]   1 year ago Upper respiratory tract infection, unspecified type   [facility], [place], [place]

## 2019-12-15 ENCOUNTER — Telehealth: Payer: Self-pay | Admitting: Physician Assistant

## 2019-12-15 NOTE — Telephone Encounter (Signed)
Pt has a rash on her arms, hands, face and lower legs, not on abd area. The rash burns but does not itch.  Bumpy under the skin.   She had her second covid vaccine on Feb 2.  She does not recall anything she has done differently.  She would like a nurse to call her back [phone]

## 2019-12-15 NOTE — Telephone Encounter (Signed)
FYI...   Advised pt she needs an office visit.  She agreed to be seen 12/16/2019 at 1:20 with Adriana.   Thanks,   -Mickel Baas

## 2019-12-16 ENCOUNTER — Ambulatory Visit: Payer: Self-pay | Admitting: Physician Assistant

## 2019-12-22 ENCOUNTER — Telehealth: Payer: Self-pay | Admitting: Physician Assistant

## 2019-12-22 NOTE — Telephone Encounter (Signed)
Spoke with patient and she said; she had the shrimp at lunch yesterday. Last night face began to swell and she began to itch.  She took benadryl for her symptoms.  Today she still has the swelling and she place cold compress on face, her voice is beginning to change and she is having tingling feeling of her lips and tongue.   Patient was advised to seek attention at the nearest Urgent Care or ER.  Patient voiced understanding and agreed to go now.    This message is being forwarded to practice administrator to find out why this was not triaged.

## 2019-12-22 NOTE — Telephone Encounter (Signed)
Pt ate shrimp last night and thinks she has developed an allergy to it/ Pt has taken benadryl and today her lips and tongue has start to have a tingling feeling/ Pt would like a call from the nurse to see what she needs to do/ please advise

## 2020-02-17 LAB — HM DIABETES EYE EXAM

## 2020-02-18 ENCOUNTER — Encounter: Payer: Self-pay | Admitting: Physician Assistant

## 2020-03-21 ENCOUNTER — Other Ambulatory Visit: Payer: Self-pay | Admitting: Physician Assistant

## 2020-03-21 DIAGNOSIS — Z1231 Encounter for screening mammogram for malignant neoplasm of breast: Secondary | ICD-10-CM

## 2020-04-21 ENCOUNTER — Ambulatory Visit
Admission: RE | Admit: 2020-04-21 | Discharge: 2020-04-21 | Disposition: A | Discharge status: Home / Self Care | Payer: No Typology Code available for payment source | Source: Ambulatory Visit | Attending: Physician Assistant | Admitting: Physician Assistant

## 2020-04-21 DIAGNOSIS — Z1231 Encounter for screening mammogram for malignant neoplasm of breast: Secondary | ICD-10-CM | POA: Diagnosis present

## 2020-04-22 ENCOUNTER — Telehealth: Payer: Self-pay

## 2020-04-22 NOTE — Telephone Encounter (Signed)
-----   Message from Mar Daring, PA-C sent at 04/22/2020 11:39 AM EDT ----- Normal mammogram. Repeat screening in one year.

## 2020-04-22 NOTE — Telephone Encounter (Signed)
Pt advised.   Thanks,   -Stellarose Cerny  

## 2020-05-06 ENCOUNTER — Ambulatory Visit (INDEPENDENT_AMBULATORY_CARE_PROVIDER_SITE_OTHER): Payer: No Typology Code available for payment source | Admitting: Physician Assistant

## 2020-05-06 ENCOUNTER — Other Ambulatory Visit: Payer: Self-pay

## 2020-05-06 ENCOUNTER — Encounter: Payer: Self-pay | Admitting: Physician Assistant

## 2020-05-06 ENCOUNTER — Other Ambulatory Visit (HOSPITAL_COMMUNITY)
Admission: RE | Admit: 2020-05-06 | Discharge: 2020-05-06 | Disposition: A | Discharge status: Home / Self Care | Payer: No Typology Code available for payment source | Source: Ambulatory Visit | Attending: Physician Assistant | Admitting: Physician Assistant

## 2020-05-06 VITALS — BP 138/81 | HR 85 | Temp 98.2°F | Resp 16 | Ht 65.0 in | Wt 186.0 lb

## 2020-05-06 DIAGNOSIS — E6609 Other obesity due to excess calories: Secondary | ICD-10-CM

## 2020-05-06 DIAGNOSIS — R7303 Prediabetes: Secondary | ICD-10-CM

## 2020-05-06 DIAGNOSIS — Z1211 Encounter for screening for malignant neoplasm of colon: Secondary | ICD-10-CM | POA: Diagnosis not present

## 2020-05-06 DIAGNOSIS — Z1272 Encounter for screening for malignant neoplasm of vagina: Secondary | ICD-10-CM | POA: Insufficient documentation

## 2020-05-06 DIAGNOSIS — Z0000 Encounter for general adult medical examination without abnormal findings: Secondary | ICD-10-CM

## 2020-05-06 DIAGNOSIS — E559 Vitamin D deficiency, unspecified: Secondary | ICD-10-CM

## 2020-05-06 DIAGNOSIS — Z6830 Body mass index (BMI) 30.0-30.9, adult: Secondary | ICD-10-CM

## 2020-05-06 DIAGNOSIS — I10 Essential (primary) hypertension: Secondary | ICD-10-CM

## 2020-05-06 DIAGNOSIS — R0789 Other chest pain: Secondary | ICD-10-CM

## 2020-05-06 DIAGNOSIS — E7800 Pure hypercholesterolemia, unspecified: Secondary | ICD-10-CM

## 2020-05-06 DIAGNOSIS — Z124 Encounter for screening for malignant neoplasm of cervix: Secondary | ICD-10-CM | POA: Insufficient documentation

## 2020-05-06 NOTE — Progress Notes (Signed)
Complete physical exam   Patient: Debra [name]   DOB: May 27, 1956   63 y.o. Female  MRN: [id number] Visit Date: 05/06/2020  Today's healthcare provider: Mar Daring, PA-C   Chief Complaint  Patient presents with  . Annual Exam   Subjective    Debra [name] is a 64 y.o. female who presents today for a complete physical exam.  She reports consuming a general diet. Home exercise routine includes walking at night 20 minutes. She generally feels fairly well. She reports sleeping well. She does have additional problems to discuss today.  HPI  04/22/20-Normal mammogram. Repeat screening in one year 05/01/2016-Pap is normal, HPV negative. Will repeat in 3-5 years.  08/13/2014-Colonoscopy-Repeat in 5 years  She c/o chest tightness happening once a week. Reports that before it was off and on for more than year and it was like once a month. Previously improved with using PPI. Still using daily and symptoms are improving.   Past Medical History:  Diagnosis Date  . Lumbar back pain   . Pre-diabetes    during pregnancy    Past Surgical History:  Procedure Laterality Date  . ABDOMINAL HYSTERECTOMY  1998  . LUMBAR LAMINECTOMY/DECOMPRESSION MICRODISCECTOMY Left 10/16/2017   Procedure: Microlumbar decompression L4-5, L5-S1 left;  Surgeon: Susa Day, MD;  Location: [facility];  Service: Orthopedics;  Laterality: Left;  120 mins  . RHINOPLASTY  1988  . SHOULDER SURGERY Right 2011  . TEMPOROMANDIBULAR JOINT SURGERY  1990   Social History   Socioeconomic History  . Marital status: Married    Spouse name: Not on file  . Number of children: Not on file  . Years of education: Not on file  . Highest education level: Not on file  Occupational History  . Not on file  Tobacco Use  . Smoking status: Former Smoker    Types: Cigarettes  . Smokeless tobacco: Never Used  . Tobacco comment: 30 years quit   Vaping Use  . Vaping Use: Never used  Substance and Sexual Activity  .  Alcohol use: Yes    Alcohol/week: 0.0 standard drinks    Comment: OCCASIONALLY  . Drug use: No  . Sexual activity: Not on file  Other Topics Concern  . Not on file  Social History Narrative  . Not on file   Social Determinants of Health   Financial Resource Strain:   . Difficulty of Paying Living Expenses:   Food Insecurity:   . Worried About Charity fundraiser in the Last Year:   . Arboriculturist in the Last Year:   Transportation Needs:   . Film/video editor (Medical):   Marland Kitchen Lack of Transportation (Non-Medical):   Physical Activity:   . Days of Exercise per Week:   . Minutes of Exercise per Session:   Stress:   . Feeling of Stress :   Social Connections:   . Frequency of Communication with Friends and Family:   . Frequency of Social Gatherings with Friends and Family:   . Attends Religious Services:   . Active Member of Clubs or Organizations:   . Attends Archivist Meetings:   Marland Kitchen Marital Status:   Intimate Partner Violence:   . Fear of Current or Ex-Partner:   . Emotionally Abused:   Marland Kitchen Physically Abused:   . Sexually Abused:    Family Status  Relation Name Status  . Mother  Deceased  . Sister 1 Alive  . Brother 1 Alive  .  Brother 2 Alive  . Daughter  (Not Specified)   Family History  Adopted: Yes  Problem Relation Age of Onset  . Breast cancer Mother 40  . Alcohol abuse Mother   . Hyperlipidemia Sister   . Hyperlipidemia Brother   . Hyperlipidemia Daughter    Allergies  Allergen Reactions  . Other Itching    Seasonal   . Pravastatin Sodium     Other reaction(s): Joint Pains  . Shellfish Allergy Swelling    Patient Care Team: Rubye Beach as PCP - General (Physician Assistant)   Medications: Outpatient Medications Prior to Visit  Medication Sig  . albuterol (PROVENTIL HFA;VENTOLIN HFA) 108 (90 Base) MCG/ACT inhaler Inhale 2 puffs into the lungs every 6 (six) hours as needed for wheezing or shortness of breath.  .  Ascorbic Acid (VITAMIN C) 1000 MG tablet Take 1,000 mg by mouth daily.   . Biotin 1000 MCG CHEW Chew 1 each by mouth daily.  . Cholecalciferol (VITAMIN D) 2000 UNITS CAPS Take 2 capsules by mouth at bedtime.   . hydrochlorothiazide (HYDRODIURIL) 25 MG tablet Take 1 tablet (25 mg total) by mouth daily.  Marland Kitchen losartan (COZAAR) 50 MG tablet Take 1 tablet (50 mg total) by mouth daily.  . Melatonin 5 MG TABS Take 1 tablet by mouth at bedtime.  Marland Kitchen omeprazole (PRILOSEC) 20 MG capsule TAKE 1 CAPSULE BY MOUTH EVERY DAY  . aspirin 81 MG tablet Take 81 mg by mouth daily.  . meloxicam (MOBIC) 7.5 MG tablet TAKE 1 TABLET BY MOUTH EVERY DAY  . ofloxacin (FLOXIN OTIC) 0.3 % OTIC solution Place 5 drops into the right ear daily. X 7 days   No facility-administered medications prior to visit.    Review of Systems  Constitutional: Negative.   HENT: Negative.   Eyes: Negative.   Respiratory: Positive for chest tightness.   Cardiovascular: Negative.   Gastrointestinal: Negative.   Endocrine: Negative.   Genitourinary: Negative.   Musculoskeletal: Positive for arthralgias.  Skin: Negative.   Allergic/Immunologic: Positive for environmental allergies and food allergies.  Neurological: Negative.   Hematological: Negative.   Psychiatric/Behavioral: Negative.     Last CBC Lab Results  Component Value Date   WBC 6.3 08/27/2018   HGB 13.8 08/27/2018   HCT 40.8 08/27/2018   MCV 89 08/27/2018   MCH 30.2 08/27/2018   RDW 13.1 08/27/2018   PLT 337 27/03/2375   Last metabolic panel Lab Results  Component Value Date   GLUCOSE 110 (H) 08/27/2018   NA 139 08/27/2018   K 4.1 08/27/2018   CL 99 08/27/2018   CO2 23 08/27/2018   BUN 14 08/27/2018   CREATININE 0.65 08/27/2018   GFRNONAA 97 08/27/2018   GFRAA 112 08/27/2018   CALCIUM 9.8 08/27/2018   PROT 7.1 08/27/2018   ALBUMIN 4.5 08/27/2018   LABGLOB 2.6 08/27/2018   AGRATIO 1.7 08/27/2018   BILITOT 0.4 08/27/2018   ALKPHOS 82 08/27/2018   AST 25  08/27/2018   ALT 32 08/27/2018   ANIONGAP 7 10/11/2017      Objective    BP (!) 138/81 (BP Location: Left Arm, Patient Position: Sitting, Cuff Size: Normal)   Pulse 85   Temp 98.2 F (36.8 C) (Oral)   Resp 16   Ht 5\' 5"  (1.651 m)   Wt 186 lb (84.4 kg)   BMI 30.95 kg/m  BP Readings from Last 3 Encounters:  05/06/20 (!) 138/81  09/16/19 (!) 164/86  08/19/19 135/86   Wt Readings from  Last 3 Encounters:  05/06/20 186 lb (84.4 kg)  09/16/19 182 lb (82.6 kg)  08/19/19 186 lb (84.4 kg)      Physical Exam Vitals reviewed.  Constitutional:      General: She is not in acute distress.    Appearance: Normal appearance. She is well-developed. She is obese. She is not ill-appearing or diaphoretic.  HENT:     Head: Normocephalic and atraumatic.     Right Ear: Hearing, tympanic membrane, ear canal and external ear normal.     Left Ear: Hearing, tympanic membrane, ear canal and external ear normal.     Nose: Nose normal.     Mouth/Throat:     Mouth: Mucous membranes are moist.     Pharynx: Oropharynx is clear. Uvula midline. No oropharyngeal exudate.  Eyes:     General: No scleral icterus.       Right eye: No discharge.        Left eye: No discharge.     Extraocular Movements: Extraocular movements intact.     Conjunctiva/sclera: Conjunctivae normal.     Pupils: Pupils are equal, round, and reactive to light.  Neck:     Thyroid: No thyromegaly.     Vascular: No carotid bruit or JVD.     Trachea: No tracheal deviation.  Cardiovascular:     Rate and Rhythm: Normal rate and regular rhythm.     Pulses: Normal pulses.     Heart sounds: Normal heart sounds. No murmur heard.  No friction rub. No gallop.   Pulmonary:     Effort: Pulmonary effort is normal. No respiratory distress.     Breath sounds: Normal breath sounds. No wheezing or rales.  Chest:     Chest wall: No tenderness.     Breasts: Breasts are symmetrical.        Right: No inverted nipple, mass, nipple discharge,  skin change or tenderness.        Left: No inverted nipple, mass, nipple discharge, skin change or tenderness.  Abdominal:     General: Abdomen is flat. Bowel sounds are normal. There is no distension.     Palpations: Abdomen is soft. There is no mass.     Tenderness: There is no abdominal tenderness. There is no guarding or rebound.     Hernia: There is no hernia in the left inguinal area.  Genitourinary:    General: Normal vulva.     Exam position: Supine.     Labia:        Right: No rash, tenderness, lesion or injury.        Left: No rash, tenderness, lesion or injury.      Vagina: Normal. No signs of injury. No vaginal discharge, erythema, tenderness or bleeding.     Cervix: No cervical motion tenderness, discharge or friability.     Adnexa:        Right: No mass, tenderness or fullness.         Left: No mass, tenderness or fullness.       Rectum: Normal.  Musculoskeletal:        General: No tenderness. Normal range of motion.     Cervical back: Normal range of motion and neck supple.     Right lower leg: No edema.     Left lower leg: No edema.  Lymphadenopathy:     Cervical: No cervical adenopathy.  Skin:    General: Skin is warm and dry.     Capillary Refill: Capillary refill takes  less than 2 seconds.     Findings: No rash.  Neurological:     General: No focal deficit present.     Mental Status: She is alert and oriented to person, place, and time. Mental status is at baseline.     Cranial Nerves: No cranial nerve deficit.     Coordination: Coordination normal.     Deep Tendon Reflexes: Reflexes are normal and symmetric.  Psychiatric:        Mood and Affect: Mood normal.        Behavior: Behavior normal.        Thought Content: Thought content normal.        Judgment: Judgment normal.     Last depression screening scores PHQ 2/9 Scores 05/06/2020 09/01/2018 08/15/2017  PHQ - 2 Score 0 0 0   Last fall risk screening Fall Risk  05/06/2020  Falls in the past year? 0   Number falls in past yr: 0  Injury with Fall? 0  Comment -  Risk for fall due to : No Fall Risks  Follow up Falls evaluation completed   Last Audit-C alcohol use screening Alcohol Use Disorder Test (AUDIT) 05/06/2020  1. How often do you have a drink containing alcohol? 2  2. How many drinks containing alcohol do you have on a typical day when you are drinking? 0  3. How often do you have six or more drinks on one occasion? 0  AUDIT-C Score 2  Alcohol Brief Interventions/Follow-up AUDIT Score <7 follow-up not indicated   A score of 3 or more in women, and 4 or more in men indicates increased risk for alcohol abuse, EXCEPT if all of the points are from question 1   No results found for any visits on 05/06/20.  Assessment & Plan    Routine Health Maintenance and Physical Exam  Exercise Activities and Dietary recommendations Goals   None     Immunization History  Administered Date(s) Administered  . Influenza Inj Mdck Quad With Preservative 07/28/2018  . Influenza,inj,Quad PF,6+ Mos 08/15/2017, 06/19/2019  . PFIZER SARS-COV-2 Vaccination 10/20/2019, 11/10/2019  . Pneumococcal-Unspecified 10/09/2015  . Td 07/09/2000  . Tdap 04/14/2012    Health Maintenance  Topic Date Due  . COLONOSCOPY  08/14/2019  . INFLUENZA VACCINE  05/08/2020  . PAP SMEAR-Modifier  04/27/2021  . TETANUS/TDAP  04/14/2022  . MAMMOGRAM  04/21/2022  . COVID-19 Vaccine  Completed  . Hepatitis C Screening  Completed  . HIV Screening  Completed    Discussed health benefits of physical activity, and encouraged her to engage in regular exercise appropriate for her age and condition.  1. Annual physical exam Normal physical exam today. Will check labs as below and f/u pending lab results. If labs are stable and WNL she will not need to have these rechecked for one year at her next annual physical exam. She is to call the office in the meantime if she has any acute issue, questions or concerns. - TSH  2.  Screening for vaginal cancer Pap collected today. Will send as below and f/u pending results. - Cytology - PAP  3. Colon cancer screening Referral to Dr. Allen Norris placed for repeat colonoscopy.  - Ambulatory referral to Gastroenterology  4. Class 1 obesity due to excess calories with serious comorbidity and body mass index (BMI) of 30.0 to 30.9 in adult Counseled patient on healthy lifestyle modifications including dieting and exercise. Will check labs as below and f/u pending results. - CBC with Differential/Platelet -  Comprehensive metabolic panel - Hemoglobin A1c - Lipid panel  5. Essential hypertension Stable. Continue Losartan 50mg  daily. Will check labs as below and f/u pending results. - CBC with Differential/Platelet - Comprehensive metabolic panel - Hemoglobin A1c - Lipid panel  6. Hypercholesteremia Stable. Diet controlled. Will check labs as below and f/u pending results. - CBC with Differential/Platelet - Comprehensive metabolic panel - Hemoglobin A1c - Lipid panel  7. Borderline diabetes Diet controlled. Will check labs as below and f/u pending results. - CBC with Differential/Platelet - Comprehensive metabolic panel - Hemoglobin A1c - Lipid panel  8. Avitaminosis D H/O this and post menopausal. Will check labs as below and f/u pending results. - CBC with Differential/Platelet - Vitamin D (25 hydroxy)  9. Atypical chest pain Recurring. Not associated with activity. Family history of CAD. Referral placed to cardiology as below for further evaluation.  - Ambulatory referral to Cardiology   No follow-ups on file.     Reynolds Bowl, PA-C, have reviewed all documentation for this visit. The documentation on 05/11/20 for the exam, diagnosis, procedures, and orders are all accurate and complete.   Rubye Beach  [facility] [phone] (phone) [phone] (fax)  [place]

## 2020-05-10 LAB — CYTOLOGY - PAP
Comment: NEGATIVE
Diagnosis: NEGATIVE
High risk HPV: NEGATIVE

## 2020-05-11 ENCOUNTER — Telehealth: Payer: Self-pay

## 2020-05-11 NOTE — Telephone Encounter (Signed)
-----   Message from Mar Daring, [place] sent at 05/10/2020  4:17 PM EDT ----- Pap is normal, HPV negative.  Will repeat in 3-5 years (at 64 yr old)

## 2020-05-11 NOTE — Telephone Encounter (Signed)
Pt advised.   Thanks,   -Kaetlyn Noa  

## 2020-05-12 LAB — COMPREHENSIVE METABOLIC PANEL
ALT: 37 IU/L — ABNORMAL HIGH (ref 0–32)
AST: 21 IU/L (ref 0–40)
Albumin/Globulin Ratio: 1.7 (ref 1.2–2.2)
Albumin: 4.5 g/dL (ref 3.8–4.8)
Alkaline Phosphatase: 124 IU/L — ABNORMAL HIGH (ref 48–121)
BUN/Creatinine Ratio: 16 (ref 12–28)
BUN: 11 mg/dL (ref 8–27)
Bilirubin Total: 0.4 mg/dL (ref 0.0–1.2)
CO2: 24 mmol/L (ref 20–29)
Calcium: 9.8 mg/dL (ref 8.7–10.3)
Chloride: 97 mmol/L (ref 96–106)
Creatinine, Ser: 0.69 mg/dL (ref 0.57–1.00)
GFR calc Af Amer: 109 mL/min/{1.73_m2} (ref >59)
GFR calc non Af Amer: 94 mL/min/{1.73_m2} (ref >59)
Globulin, Total: 2.6 g/dL (ref 1.5–4.5)
Glucose: 123 mg/dL — ABNORMAL HIGH (ref 65–99)
Potassium: 4.4 mmol/L (ref 3.5–5.2)
Sodium: 140 mmol/L (ref 134–144)
Total Protein: 7.1 g/dL (ref 6.0–8.5)

## 2020-05-12 LAB — CBC WITH DIFFERENTIAL/PLATELET
Basophils Absolute: 0.1 10*3/uL (ref 0.0–0.2)
Basos: 1 %
EOS (ABSOLUTE): 0.1 10*3/uL (ref 0.0–0.4)
Eos: 2 %
Hematocrit: 43.1 % (ref 34.0–46.6)
Hemoglobin: 14.5 g/dL (ref 11.1–15.9)
Immature Grans (Abs): 0 10*3/uL (ref 0.0–0.1)
Immature Granulocytes: 0 %
Lymphocytes Absolute: 2.2 10*3/uL (ref 0.7–3.1)
Lymphs: 33 %
MCH: 29.8 pg (ref 26.6–33.0)
MCHC: 33.6 g/dL (ref 31.5–35.7)
MCV: 89 fL (ref 79–97)
Monocytes Absolute: 0.5 10*3/uL (ref 0.1–0.9)
Monocytes: 8 %
Neutrophils Absolute: 3.9 10*3/uL (ref 1.4–7.0)
Neutrophils: 56 %
Platelets: 371 10*3/uL (ref 150–450)
RBC: 4.87 x10E6/uL (ref 3.77–5.28)
RDW: 12.6 % (ref 11.7–15.4)
WBC: 6.8 10*3/uL (ref 3.4–10.8)

## 2020-05-12 LAB — HEMOGLOBIN A1C
Est. average glucose Bld gHb Est-mCnc: 151 mg/dL
Hgb A1c MFr Bld: 6.9 % — ABNORMAL HIGH (ref 4.8–5.6)

## 2020-05-12 LAB — VITAMIN D 25 HYDROXY (VIT D DEFICIENCY, FRACTURES): Vit D, 25-Hydroxy: 27 ng/mL — ABNORMAL LOW (ref 30.0–100.0)

## 2020-05-12 LAB — TSH: TSH: 1.98 u[IU]/mL (ref 0.450–4.500)

## 2020-05-12 LAB — LIPID PANEL
Chol/HDL Ratio: 4.6 ratio — ABNORMAL HIGH (ref 0.0–4.4)
Cholesterol, Total: 245 mg/dL — ABNORMAL HIGH (ref 100–199)
HDL: 53 mg/dL (ref >39)
LDL Chol Calc (NIH): 156 mg/dL — ABNORMAL HIGH (ref 0–99)
Triglycerides: 200 mg/dL — ABNORMAL HIGH (ref 0–149)
VLDL Cholesterol Cal: 36 mg/dL (ref 5–40)

## 2020-05-13 ENCOUNTER — Other Ambulatory Visit: Payer: Self-pay

## 2020-05-13 ENCOUNTER — Ambulatory Visit (INDEPENDENT_AMBULATORY_CARE_PROVIDER_SITE_OTHER): Payer: No Typology Code available for payment source | Admitting: Cardiology

## 2020-05-13 ENCOUNTER — Encounter: Payer: Self-pay | Admitting: Cardiology

## 2020-05-13 ENCOUNTER — Telehealth: Payer: Self-pay

## 2020-05-13 VITALS — BP 130/90 | HR 84 | Ht 65.0 in | Wt 187.0 lb

## 2020-05-13 DIAGNOSIS — R072 Precordial pain: Secondary | ICD-10-CM

## 2020-05-13 DIAGNOSIS — R079 Chest pain, unspecified: Secondary | ICD-10-CM | POA: Diagnosis not present

## 2020-05-13 DIAGNOSIS — E7800 Pure hypercholesterolemia, unspecified: Secondary | ICD-10-CM | POA: Diagnosis not present

## 2020-05-13 DIAGNOSIS — I10 Essential (primary) hypertension: Secondary | ICD-10-CM | POA: Diagnosis not present

## 2020-05-13 MED ORDER — METOPROLOL TARTRATE 100 MG PO TABS
100.0000 mg | ORAL_TABLET | Freq: Once | ORAL | 0 refills | Status: DC
Start: 2020-05-13 — End: 2020-07-01

## 2020-05-13 NOTE — Progress Notes (Signed)
Cardiology Office Note:    Date:  05/13/2020   ID:  Debra [name], DOB 1956/04/06, MRN [id number]  PCP:  Mar Daring, PA-C  [facility] Cardiologist:  Kate Sable, MD  [place] Electrophysiologist:  None   Referring MD: Florian Buff*   Chief Complaint  Patient presents with  . New Patient (Initial Visit)    Established for Atypical chest pains. Medications verbally reviewed with patient.    Debra [name] is a 64 y.o. female who is being seen today for the evaluation of chest pain at the request of Mar Daring, P*.   History of Present Illness:    Debra [name] is a 64 y.o. female with a hx of hypertension, hyperlipidemia who presents due to chest pain.  Patient states having symptoms of chest pressure on and off for over a year now.  Symptoms seems to have worsened over the past several months.  She characterizes symptoms as chest pressure not typically associated with exertion, radiating down her arms.  She rates pressure as 4 out of 10 in severity, lasting a few seconds.  She does not know of her family history since she was adopted.  She smokes sparingly for 3 years as a teen.  She has a history of hyperlipidemia, previously tried Pravachol causing myalgias and she stopped.  Had a recent lipid panel drawn 2 days ago by PCP.  She denies shortness of breath with exertion.  Denies palpitations, dizziness, presyncope or syncope.   Past Medical History:  Diagnosis Date  . Hyperlipidemia   . Hypertension   . Lumbar back pain   . Pre-diabetes    during pregnancy     Past Surgical History:  Procedure Laterality Date  . ABDOMINAL HYSTERECTOMY  1998  . LUMBAR LAMINECTOMY/DECOMPRESSION MICRODISCECTOMY Left 10/16/2017   Procedure: Microlumbar decompression L4-5, L5-S1 left;  Surgeon: Susa Day, MD;  Location: [facility];  Service: Orthopedics;  Laterality: Left;  120 mins  . RHINOPLASTY  1988  . SHOULDER SURGERY Right 2011  .  TEMPOROMANDIBULAR JOINT SURGERY  1990    Current Medications: Current Meds  Medication Sig  . Biotin 1000 MCG CHEW Chew 1 each by mouth daily.  Marland Kitchen EPINEPHrine 0.3 mg/0.3 mL IJ SOAJ injection INJECT 1 PEN IN THIGH MUSCLE ONE TIME FOR ALLERGIC REACTION. MAY REPEAT IN 5 TO 15 MINUTES IF NEEDED  . famotidine (PEPCID) 20 MG tablet Take 20 mg by mouth 2 (two) times daily.  . fexofenadine (ALLEGRA ALLERGY) 180 MG tablet Take 180 mg by mouth daily.  . hydrochlorothiazide (HYDRODIURIL) 25 MG tablet Take 1 tablet (25 mg total) by mouth daily.  Marland Kitchen losartan (COZAAR) 50 MG tablet Take 1 tablet (50 mg total) by mouth daily.  . Melatonin 10 MG TABS Take 1 tablet by mouth at bedtime.      Allergies:   Other, Pravastatin sodium, and Shellfish allergy   Social History   Socioeconomic History  . Marital status: Married    Spouse name: Not on file  . Number of children: Not on file  . Years of education: Not on file  . Highest education level: Not on file  Occupational History  . Not on file  Tobacco Use  . Smoking status: Former Smoker    Types: Cigarettes  . Smokeless tobacco: Never Used  . Tobacco comment: 30 years quit   Vaping Use  . Vaping Use: Never used  Substance and Sexual Activity  . Alcohol use: Yes  Alcohol/week: 0.0 standard drinks    Comment: OCCASIONALLY  . Drug use: No  . Sexual activity: Not on file  Other Topics Concern  . Not on file  Social History Narrative  . Not on file   Social Determinants of Health   Financial Resource Strain:   . Difficulty of Paying Living Expenses:   Food Insecurity:   . Worried About Charity fundraiser in the Last Year:   . Arboriculturist in the Last Year:   Transportation Needs:   . Film/video editor (Medical):   Marland Kitchen Lack of Transportation (Non-Medical):   Physical Activity:   . Days of Exercise per Week:   . Minutes of Exercise per Session:   Stress:   . Feeling of Stress :   Social Connections:   . Frequency of  Communication with Friends and Family:   . Frequency of Social Gatherings with Friends and Family:   . Attends Religious Services:   . Active Member of Clubs or Organizations:   . Attends Archivist Meetings:   Marland Kitchen Marital Status:      Family History: The patient's family history includes Alcohol abuse in her mother; Breast cancer (age of onset: 64) in her mother; Hyperlipidemia in her brother, daughter, and sister. She was adopted.  ROS:   Please see the history of present illness.     All other systems reviewed and are negative.  EKGs/Labs/Other Studies Reviewed:    The following studies were reviewed today:   EKG:  EKG is  ordered today.  The ekg ordered today demonstrates normal sinus rhythm  Recent Labs: 05/11/2020: ALT 37; BUN 11; Creatinine, Ser 0.69; Hemoglobin 14.5; Platelets 371; Potassium 4.4; Sodium 140; TSH 1.980  Recent Lipid Panel    Component Value Date/Time   CHOL 245 (H) 05/11/2020 0827   TRIG 200 (H) 05/11/2020 0827   HDL 53 05/11/2020 0827   CHOLHDL 4.6 (H) 05/11/2020 0827   CHOLHDL 4.4 08/16/2017 0801   LDLCALC 156 (H) 05/11/2020 0827   LDLCALC 171 (H) 08/16/2017 0801    Physical Exam:    VS:  BP 130/90 (BP Location: Right Arm, Patient Position: Sitting, Cuff Size: Normal)   Pulse 84   Ht _0  (1.651 m)   Wt 187 lb (84.8 kg)   BMI 31.12 kg/m     Wt Readings from Last 3 Encounters:  05/13/20 187 lb (84.8 kg)  05/06/20 186 lb (84.4 kg)  09/16/19 182 lb (82.6 kg)     GEN:  Well nourished, well developed in no acute distress HEENT: Normal NECK: No JVD; No carotid bruits LYMPHATICS: No lymphadenopathy CARDIAC: RRR, no murmurs, rubs, gallops RESPIRATORY:  Clear to auscultation without rales, wheezing or rhonchi  ABDOMEN: Soft, non-tender, non-distended MUSCULOSKELETAL:  No edema; No deformity  SKIN: Warm and dry NEUROLOGIC:  Alert and oriented x 3 PSYCHIATRIC:  Normal affect   ASSESSMENT:    1. Chest pain of uncertain etiology     2. Essential hypertension   3. Pure hypercholesterolemia   4. Precordial pain    PLAN:    In order of problems listed above:  1. Patient with symptoms of chest pain.  Symptoms appropriate atypical but has been worsening.  Her family history is unknown, risk factors include hypertension, hyperlipidemia, age.  Will evaluate for presence of CAD with a coronary CTA.  Get echocardiogram to evaluate cardiac function. 2. Patient with history of hypertension, blood pressure reasonably controlled.  Continue current blood pressure meds.  3. Patient with history of hyperlipidemia, previously placed on pravastatin causing myalgias.  Last total cholesterol and LDL not at goal.  Will start patient on PCSK9.  Follow-up after echo and coronary CTA.  This note was generated in part or whole with voice recognition software. Voice recognition is usually quite accurate but there are transcription errors that can and very often do occur. I apologize for any typographical errors that were not detected and corrected.  Medication Adjustments/Labs and Tests Ordered: Current medicines are reviewed at length with the patient today.  Concerns regarding medicines are outlined above.  Orders Placed This Encounter  Procedures  . CT CORONARY MORPH W/CTA COR W/SCORE W/CA W/CM &/OR WO/CM  . CT CORONARY FRACTIONAL FLOW RESERVE DATA PREP  . CT CORONARY FRACTIONAL FLOW RESERVE FLUID ANALYSIS  . EKG 12-Lead  . ECHOCARDIOGRAM COMPLETE   Meds ordered this encounter  Medications  . metoprolol tartrate (LOPRESSOR) 100 MG tablet    Sig: Take 1 tablet (100 mg total) by mouth once for 1 dose. Take 2 hours prior to your CT scan.    Dispense:  1 tablet    Refill:  0    Patient Instructions  Medication Instructions:   [place] and ask if they will cover [place] or Praluent, and then give our office a call back and ask for [place]en to inform us of which drug we can prescribe to avoid you going through a  denial process.  *If you need a refill on your cardiac medications before your next appointment, please call your pharmacy*   Lab Work: None Ordered If you have labs (blood work) drawn today and your tests are completely normal, you will receive your results only by: Marland Kitchen MyChart Message (if you have MyChart) OR . A paper copy in the mail If you have any lab test that is abnormal or we need to change your treatment, we will call you to review the results.   Testing/Procedures:  1.  Your physician has requested that you have an echocardiogram. Echocardiography is a painless test that uses sound waves to create images of your heart. It provides your doctor with information about the size and shape of your heart and how well your heart's chambers and valves are working. This procedure takes approximately one hour. There are no restrictions for this procedure.  2.  Your physician has requested that you have coronary CT. Cardiac computed tomography (CT) is a painless test that uses an x-ray machine to take clear, detailed pictures of your heart.     [facility] [address] [place], [place] [address] [phone]  I [facility], please arrive 15 mins early for check-in and test prep.    Please follow these instructions carefully (unless otherwise directed): On the Night Before the Test: . Be sure to Drink plenty of water. . Do not consume any caffeinated/decaffeinated beverages or chocolate 12 hours prior to your test. . Do not take any antihistamines 12 hours prior to your test.   On the Day of the Test: . Drink plenty of water. Do not drink any water within one hour of the test. . Do not eat any food 4 hours prior to the test. . You may take your regular medications prior to the test.  . Take metoprolol (Lopressor) two hours prior to test. . HOLD Furosemide/Hydrochlorothiazide morning of the test. . FEMALES-  please wear underwire-free bra if available  After the Test: . Drink plenty of water. . After receiving IV contrast, you may experience a mild flushed feeling. This is normal. . On occasion, you may experience a mild rash up to 24 hours after the test. This is not dangerous. If this occurs, you can take Benadryl 25 mg and increase your fluid intake. . If you experience trouble breathing, this can be serious. If it is severe call 911 IMMEDIATELY. If it is mild, please call our office. . If you take any of these medications: Glipizide/Metformin, Avandament, Glucavance, please do not take 48 hours after completing test unless otherwise instructed.   Once we have confirmed authorization from your insurance company, we will call you to set up a date and time for your test. Based on how quickly your insurance processes prior authorizations requests, please allow up to 4 weeks to be contacted for scheduling your Cardiac CT appointment. Be advised that routine Cardiac CT appointments could be scheduled as many as 8 weeks after your provider has ordered it.  For non-scheduling related questions, please contact the cardiac imaging nurse navigator should you have any questions/concerns: Marchia Bond, Cardiac Imaging Nurse Navigator [place] Saver, Interim Cardiac Imaging Nurse Navigator [facility] Direct Office Dial: [phone]   *For scheduling needs, including cancellations and rescheduling, please call Vivien Rota at [phone], option 3.     Follow-Up: At [facility], you and your health needs are our priority.  As part of our continuing mission to provide you with exceptional heart care, we have created designated Provider Care Teams.  These Care Teams include your primary Cardiologist (physician) and Advanced Practice Providers (APPs -  Physician Assistants and Nurse Practitioners) who all work together to provide you with the care you need, when you need it.  We  recommend signing up for the patient portal called "MyChart".  Sign up information is provided on this After Visit Summary.  MyChart is used to connect with patients for Virtual Visits (Telemedicine).  Patients are able to view lab/test results, encounter notes, upcoming appointments, etc.  Non-urgent messages can be sent to your provider as well.   To learn more about what you can do with MyChart, go to [email or web address].    Your next appointment:   Follow up after CTA and Echo   The format for your next appointment:   In Person  Provider:   Kate Sable, MD   Other Instructions    Echocardiogram An echocardiogram is a procedure that uses painless sound waves (ultrasound) to produce an image of the heart. Images from an echocardiogram can provide important information about:  Signs of coronary artery disease (CAD).  Aneurysm detection. An aneurysm is a weak or damaged part of an artery wall that bulges out from the normal force of blood pumping through the body.  Heart size and shape. Changes in the size or shape of the heart can be associated with certain conditions, including heart failure, aneurysm, and CAD.  Heart muscle function.  Heart valve function.  Signs of a past heart attack.  Fluid buildup around the heart.  Thickening of the heart muscle.  A tumor or infectious growth around the heart valves. Tell a health care provider about:  Any allergies you have.  All medicines you are taking, including vitamins, herbs, eye drops, creams, and over-the-counter medicines.  Any blood disorders you have.  Any surgeries you have had.  Any medical conditions you have.  Whether you are pregnant or may be pregnant. What  are the risks? Generally, this is a safe procedure. However, problems may occur, including:  Allergic reaction to dye (contrast) that may be used during the procedure. What happens before the procedure? No specific preparation is needed. You  may eat and drink normally. What happens during the procedure?   An IV tube may be inserted into one of your veins.  You may receive contrast through this tube. A contrast is an injection that improves the quality of the pictures from your heart.  A gel will be applied to your chest.  A wand-like tool (transducer) will be moved over your chest. The gel will help to transmit the sound waves from the transducer.  The sound waves will harmlessly bounce off of your heart to allow the heart images to be captured in real-time motion. The images will be recorded on a computer. The procedure may vary among health care providers and hospitals. What happens after the procedure?  You may return to your normal, everyday life, including diet, activities, and medicines, unless your health care provider tells you not to do that. Summary  An echocardiogram is a procedure that uses painless sound waves (ultrasound) to produce an image of the heart.  Images from an echocardiogram can provide important information about the size and shape of your heart, heart muscle function, heart valve function, and fluid buildup around your heart.  You do not need to do anything to prepare before this procedure. You may eat and drink normally.  After the echocardiogram is completed, you may return to your normal, everyday life, unless your health care provider tells you not to do that. This information is not intended to replace advice given to you by your health care provider. Make sure you discuss any questions you have with your health care provider. Document Revised: 01/15/2019 Document Reviewed: 10/27/2016 Elsevier Patient Education  2020 [place], Kate Sable, MD  05/13/2020 4:56 PM    [place]

## 2020-05-13 NOTE — Telephone Encounter (Signed)
Patient will be calling to inform us which medication her insurance will cover, Repatha or Praluent. Then we can prescribe accordingly per Dr. Garen Lah.

## 2020-05-13 NOTE — Patient Instructions (Signed)
Medication Instructions:   [place] and ask if they will cover [place] or Praluent, and then give our office a call back and ask for Zyron [place] to inform us of which drug we can prescribe to avoid you going through a denial process.  *If you need a refill on your cardiac medications before your next appointment, please call your pharmacy*   Lab Work: None Ordered If you have labs (blood work) drawn today and your tests are completely normal, you will receive your results only by: Marland Kitchen MyChart Message (if you have MyChart) OR . A paper copy in the mail If you have any lab test that is abnormal or we need to change your treatment, we will call you to review the results.   Testing/Procedures:  1.  Your physician has requested that you have an echocardiogram. Echocardiography is a painless test that uses sound waves to create images of your heart. It provides your doctor with information about the size and shape of your heart and how well your heart's chambers and valves are working. This procedure takes approximately one hour. There are no restrictions for this procedure.  2.  Your physician has requested that you have coronary CT. Cardiac computed tomography (CT) is a painless test that uses an x-ray machine to take clear, detailed pictures of your heart.     [facility] [address] [place], [place] [address] [phone]  I [facility], please arrive 15 mins early for check-in and test prep.    Please follow these instructions carefully (unless otherwise directed): On the Night Before the Test: . Be sure to Drink plenty of water. . Do not consume any caffeinated/decaffeinated beverages or chocolate 12 hours prior to your test. . Do not take any antihistamines 12 hours prior to your test.   On the Day of the Test: . Drink plenty of water. Do not drink any water within one hour of the  test. . Do not eat any food 4 hours prior to the test. . You may take your regular medications prior to the test.  . Take metoprolol (Lopressor) two hours prior to test. . HOLD Furosemide/Hydrochlorothiazide morning of the test. . FEMALES- please wear underwire-free bra if available        After the Test: . Drink plenty of water. . After receiving IV contrast, you may experience a mild flushed feeling. This is normal. . On occasion, you may experience a mild rash up to 24 hours after the test. This is not dangerous. If this occurs, you can take Benadryl 25 mg and increase your fluid intake. . If you experience trouble breathing, this can be serious. If it is severe call 911 IMMEDIATELY. If it is mild, please call our office. . If you take any of these medications: Glipizide/Metformin, Avandament, Glucavance, please do not take 48 hours after completing test unless otherwise instructed.   Once we have confirmed authorization from your insurance company, we will call you to set up a date and time for your test. Based on how quickly your insurance processes prior authorizations requests, please allow up to 4 weeks to be contacted for scheduling your Cardiac CT appointment. Be advised that routine Cardiac CT appointments could be scheduled as many as 8 weeks after your provider has ordered it.  For non-scheduling related questions, please contact the cardiac imaging nurse navigator should you have any questions/concerns: Marchia Bond, Cardiac Imaging Nurse Navigator [place], Interim  Cardiac Imaging Nurse Navigator [facility] Direct Office Dial: [phone]   *For scheduling needs, including cancellations and rescheduling, please call Vivien Rota at [phone], option 3.     Follow-Up: At [facility], you and your health needs are our priority.  As part of our continuing mission to provide you with exceptional heart care, we have created designated Provider Care  Teams.  These Care Teams include your primary Cardiologist (physician) and Advanced Practice Providers (APPs -  Physician Assistants and Nurse Practitioners) who all work together to provide you with the care you need, when you need it.  We recommend signing up for the patient portal called "MyChart".  Sign up information is provided on this After Visit Summary.  MyChart is used to connect with patients for Virtual Visits (Telemedicine).  Patients are able to view lab/test results, encounter notes, upcoming appointments, etc.  Non-urgent messages can be sent to your provider as well.   To learn more about what you can do with MyChart, go to [email or web address].    Your next appointment:   Follow up after CTA and Echo   The format for your next appointment:   In Person  Provider:   Kate Sable, MD   Other Instructions    Echocardiogram An echocardiogram is a procedure that uses painless sound waves (ultrasound) to produce an image of the heart. Images from an echocardiogram can provide important information about:  Signs of coronary artery disease (CAD).  Aneurysm detection. An aneurysm is a weak or damaged part of an artery wall that bulges out from the normal force of blood pumping through the body.  Heart size and shape. Changes in the size or shape of the heart can be associated with certain conditions, including heart failure, aneurysm, and CAD.  Heart muscle function.  Heart valve function.  Signs of a past heart attack.  Fluid buildup around the heart.  Thickening of the heart muscle.  A tumor or infectious growth around the heart valves. Tell a health care provider about:  Any allergies you have.  All medicines you are taking, including vitamins, herbs, eye drops, creams, and over-the-counter medicines.  Any blood disorders you have.  Any surgeries you have had.  Any medical conditions you have.  Whether you are pregnant or may be pregnant. What are  the risks? Generally, this is a safe procedure. However, problems may occur, including:  Allergic reaction to dye (contrast) that may be used during the procedure. What happens before the procedure? No specific preparation is needed. You may eat and drink normally. What happens during the procedure?   An IV tube may be inserted into one of your veins.  You may receive contrast through this tube. A contrast is an injection that improves the quality of the pictures from your heart.  A gel will be applied to your chest.  A wand-like tool (transducer) will be moved over your chest. The gel will help to transmit the sound waves from the transducer.  The sound waves will harmlessly bounce off of your heart to allow the heart images to be captured in real-time motion. The images will be recorded on a computer. The procedure may vary among health care providers and hospitals. What happens after the procedure?  You may return to your normal, everyday life, including diet, activities, and medicines, unless your health care provider tells you not to do that. Summary  An echocardiogram is a procedure that uses painless sound waves (ultrasound) to produce  an image of the heart.  Images from an echocardiogram can provide important information about the size and shape of your heart, heart muscle function, heart valve function, and fluid buildup around your heart.  You do not need to do anything to prepare before this procedure. You may eat and drink normally.  After the echocardiogram is completed, you may return to your normal, everyday life, unless your health care provider tells you not to do that. This information is not intended to replace advice given to you by your health care provider. Make sure you discuss any questions you have with your health care provider. Document Revised: 01/15/2019 Document Reviewed: 10/27/2016 Elsevier Patient Education  [place].

## 2020-05-16 ENCOUNTER — Telehealth (INDEPENDENT_AMBULATORY_CARE_PROVIDER_SITE_OTHER): Payer: Self-pay | Admitting: Gastroenterology

## 2020-05-16 ENCOUNTER — Telehealth: Payer: Self-pay | Admitting: Cardiology

## 2020-05-16 ENCOUNTER — Other Ambulatory Visit: Payer: Self-pay

## 2020-05-16 DIAGNOSIS — Z1211 Encounter for screening for malignant neoplasm of colon: Secondary | ICD-10-CM

## 2020-05-16 DIAGNOSIS — Z86010 Personal history of colonic polyps: Secondary | ICD-10-CM

## 2020-05-16 MED ORDER — NA SULFATE-K SULFATE-MG SULF 17.5-3.13-1.6 GM/177ML PO SOLN: 1.0000 | Freq: Once | ORAL | 0 refills | Status: AC

## 2020-05-16 MED ORDER — REPATHA SURECLICK 140 MG/ML ~~LOC~~ SOAJ: 1.0000 "pen " | SUBCUTANEOUS | 11 refills | Status: DC

## 2020-05-16 NOTE — Telephone Encounter (Signed)
Patient calling to relay insurance will cover either one every 28 days

## 2020-05-16 NOTE — Telephone Encounter (Signed)
Patient is calling back to change pharmacy to [facility]

## 2020-05-16 NOTE — Telephone Encounter (Signed)
Called patient and let her know I updated her pharmacy as well as sent in prescription for Repatha 140 mg/ML, inject 1 pen every 14 days. She will let us know if any issues arise.  Routing to Dr. Garen Lah to make him aware of the selection.

## 2020-05-16 NOTE — Progress Notes (Signed)
Gastroenterology Pre-Procedure Review  Request Date: Friday 06/03/20 Requesting Physician: Dr. Allen Norris  PATIENT REVIEW QUESTIONS: The patient responded to the following health history questions as indicated:    1. Are you having any GI issues? no 2. Do you have a personal history of Polyps? yes (about 4 years ago) 3. Do you have a family history of Colon Cancer or Polyps? no 4. Diabetes Mellitus? no 5. Joint replacements in the past 12 months?no 6. Major health problems in the past 3 months?no 7. Any artificial heart valves, MVP, or defibrillator?no    MEDICATIONS & ALLERGIES:    Patient reports the following regarding taking any anticoagulation/antiplatelet therapy:   Plavix, Coumadin, Eliquis, Xarelto, Lovenox, Pradaxa, Brilinta, or Effient? no Aspirin? no  Patient confirms/reports the following medications:  Current Outpatient Medications  Medication Sig Dispense Refill  . Biotin 1000 MCG CHEW Chew 1 each by mouth daily.    Marland Kitchen EPINEPHrine 0.3 mg/0.3 mL IJ SOAJ injection INJECT 1 PEN IN THIGH MUSCLE ONE TIME FOR ALLERGIC REACTION. MAY REPEAT IN 5 TO 15 MINUTES IF NEEDED    . famotidine (PEPCID) 20 MG tablet Take 20 mg by mouth 2 (two) times daily.    . fexofenadine (ALLEGRA ALLERGY) 180 MG tablet Take 180 mg by mouth daily.    . hydrochlorothiazide (HYDRODIURIL) 25 MG tablet Take 1 tablet (25 mg total) by mouth daily. 90 tablet 3  . losartan (COZAAR) 50 MG tablet Take 1 tablet (50 mg total) by mouth daily. 90 tablet 3  . Melatonin 10 MG TABS Take 1 tablet by mouth at bedtime.     . Evolocumab (REPATHA SURECLICK) 619 MG/ML SOAJ Inject 1 pen into the skin every 14 (fourteen) days. (Patient not taking: Reported on 05/16/2020) 2.1 mL 11  . metoprolol tartrate (LOPRESSOR) 100 MG tablet Take 1 tablet (100 mg total) by mouth once for 1 dose. Take 2 hours prior to your CT scan. 1 tablet 0  . Na Sulfate-K Sulfate-Mg Sulf 17.5-3.13-1.6 GM/177ML SOLN Take 1 kit by mouth once for 1 dose. 354 mL 0    No current facility-administered medications for this visit.    Patient confirms/reports the following allergies:  Allergies  Allergen Reactions  . Other Itching    Seasonal   . Pravastatin Sodium     Other reaction(s): Joint Pains  . Shellfish Allergy Swelling    No orders of the defined types were placed in this encounter.   AUTHORIZATION INFORMATION Primary Insurance: 1D#: Group #:  Secondary Insurance: 1D#: Group #:  SCHEDULE INFORMATION: Date: Friday 06/03/20 Time: Location:[facility]

## 2020-05-17 ENCOUNTER — Telehealth: Payer: Self-pay | Admitting: Cardiology

## 2020-05-17 ENCOUNTER — Telehealth: Payer: Self-pay

## 2020-05-17 DIAGNOSIS — Z86010 Personal history of colonic polyps: Secondary | ICD-10-CM

## 2020-05-17 DIAGNOSIS — Z1211 Encounter for screening for malignant neoplasm of colon: Secondary | ICD-10-CM

## 2020-05-17 NOTE — Telephone Encounter (Signed)
Contacted patient to make her aware that we will need to reschedule her colonoscopy date due to cardiac clearance is needed.  She will call me back tomorrow to let me know when she would like to reschedule sometime in September after her cardiac clearance is obtained.  Thanks,  [place], [place]

## 2020-05-17 NOTE — Telephone Encounter (Signed)
   Primary Cardiologist: Kate Sable, MD  Chart reviewed as part of pre-operative protocol coverage. Patient was contacted 05/17/2020 in reference to pre-operative [name] assessment for pending surgery as outlined below.  Debra [name] was last seen on 05/13/2020 by Dr Marquis Lunch for chest pain.    Due to new or worsening symptoms, Debra [name] will require further testing for pre-operative [name] assessment. She is to have an echocardiogram and coronary CTA.   Unless this is an urgent colonoscopy we recommend she have these studies completed and reviewed before clearance is granted.  Pre-op covering staff: - Please schedule appointment and call patient to inform them. If patient already had an upcoming appointment within acceptable timeframe, please add "pre-op clearance" to the appointment notes so provider is aware. - Please contact requesting surgeon's office via preferred method (i.e, phone, fax) to inform them of need for appointment prior to surgery.  Kerin Ransom, PA-C 05/17/2020, 2:56 PM

## 2020-05-17 NOTE — Telephone Encounter (Signed)
This note needs to be closed by Governor Rooks before clearance can be sent-   Kerin Ransom PA-C 05/17/2020 4:46 PM

## 2020-05-17 NOTE — Telephone Encounter (Signed)
   [facility] Pre-operative Risk Assessment    HEARTCARE STAFF: - Please ensure there is not already an duplicate clearance open for this procedure. - Under Visit Info/Reason for Call, type in Other and utilize the format Clearance MM/DD/YY or Clearance TBD. Do not use dashes or single digits. - If request is for dental extraction, please clarify the # of teeth to be extracted.  Request for surgical clearance:  1. What type of surgery is being performed? Colonoscopy  2. When is this surgery scheduled? 06/03/20  3. What type of clearance is required (medical clearance vs. Pharmacy clearance to hold med vs. Both)?  Not listed  4. Are there any medications that need to be held prior to surgery and how long? Not listed  5. Practice name and name of physician performing surgery? Dr. Lucilla Lame, [facility]  6. What is the office phone number? [phone]   7.   What is the office fax number? [phone]  8.   Anesthesia type (None, local, MAC, general) ? Not listed  Elissa Hefty 05/17/2020, 1:03 PM  _________________________________________________________________   (provider comments below)

## 2020-05-18 ENCOUNTER — Other Ambulatory Visit: Payer: Self-pay

## 2020-05-18 NOTE — Telephone Encounter (Signed)
Pharmacy calling in stating this medication does need a prior [place]

## 2020-05-19 ENCOUNTER — Telehealth: Payer: Self-pay

## 2020-05-19 NOTE — Telephone Encounter (Signed)
Patient called inquiring about her Repatha. Received an insurance denial letter by fax today. I informed the patient I would work on submitting the necessary paperwork and follow up with her next week.

## 2020-05-20 ENCOUNTER — Telehealth: Payer: Self-pay | Admitting: Cardiology

## 2020-05-20 NOTE — Telephone Encounter (Signed)
[id number] Reference number for PA for Repatha. Please call to discuss.

## 2020-05-20 NOTE — Telephone Encounter (Signed)
Patient returning call. Patient stated she did not know the prescription ID number or phone number.   She stated that all she knows is the  RX BIN number - [id number] RX PCN - [id number] - Tressie Stalker

## 2020-05-20 NOTE — Telephone Encounter (Signed)
Spoke with [employer] representative who states Repatha need prior authorization but she does not have any information to offer [place] to reach the third party company who covers patient's prescriptions. Left message requesting to have patient call back with information such as her prescription ID# and prescription plan's phone number so we can initiate a PA for [place].

## 2020-05-24 NOTE — Telephone Encounter (Signed)
PA for Repatha was denied.  There is an option to do an appeal.

## 2020-05-24 NOTE — Telephone Encounter (Signed)
Faxed the appeal paperwork today.

## 2020-05-24 NOTE — Telephone Encounter (Signed)
[facility] calling to check the status of prior auth Please call [phone]  Reference number [id number]

## 2020-05-25 ENCOUNTER — Telehealth: Payer: Self-pay | Admitting: Cardiology

## 2020-05-25 NOTE — Telephone Encounter (Signed)
Reference # [id number] Pharmacy states Repatha has been denied by insurance . Please call to discuss further action.

## 2020-05-25 NOTE — Telephone Encounter (Signed)
Spoke to [employer] and informed them that I sent in an appeal to [employer].

## 2020-05-26 ENCOUNTER — Telehealth: Payer: Self-pay | Admitting: Cardiology

## 2020-05-26 NOTE — Telephone Encounter (Signed)
Patient calling in about testing schedule. Patient is possibly changing jobs and due to insurance she is hoping to have her echo and card CT done by the end of August. Patient made aware of the lengthy insurance time frame for CT's and the unavailability of having an Echo in august. Patient states if these tests cannot be done by then she may have to postpone 3 months so her new insurance will kick in  Plese advise patient on what is most appropriate

## 2020-05-31 NOTE — Telephone Encounter (Signed)
Spoke to patient and informed her that I was in contact with pre cert regarding her CTA order and that they are working on her authorization now. I also informed her that I faxed the appeal information to [place].  I will touch base with her on Thursday, 8/26. Patient was grateful for the follow up and the call back.

## 2020-06-08 ENCOUNTER — Telehealth (HOSPITAL_COMMUNITY): Payer: Self-pay | Admitting: Emergency Medicine

## 2020-06-08 NOTE — Telephone Encounter (Signed)
Reaching out to patient to offer assistance regarding upcoming cardiac imaging study; pt verbalizes understanding of appt date/time, parking situation and where to check in, pre-test NPO status and medications ordered, and verified current allergies; name and call back number provided for further questions should they arise Raelea Gosse RN Navigator Cardiac Imaging [facility] [phone] office [phone] cell 

## 2020-06-09 ENCOUNTER — Ambulatory Visit
Admission: RE | Admit: 2020-06-09 | Discharge: 2020-06-09 | Disposition: A | Discharge status: Home / Self Care | Payer: No Typology Code available for payment source | Source: Ambulatory Visit | Attending: Cardiology | Admitting: Cardiology

## 2020-06-09 ENCOUNTER — Other Ambulatory Visit: Payer: Self-pay

## 2020-06-09 DIAGNOSIS — R072 Precordial pain: Secondary | ICD-10-CM

## 2020-06-09 MED ORDER — METOPROLOL TARTRATE 5 MG/5ML IV SOLN
10.0000 mg | Freq: Once | INTRAVENOUS | Status: AC
  Administered 2020-06-09: 10 mg via INTRAVENOUS

## 2020-06-09 MED ORDER — NITROGLYCERIN 0.4 MG SL SUBL
0.8000 mg | SUBLINGUAL_TABLET | Freq: Once | SUBLINGUAL | Status: AC
  Administered 2020-06-09: 0.8 mg via SUBLINGUAL

## 2020-06-09 MED ORDER — IOHEXOL 350 MG/ML SOLN
85.0000 mL | Freq: Once | INTRAVENOUS | Status: AC | PRN
  Administered 2020-06-09: 85 mL via INTRAVENOUS

## 2020-06-09 MED ORDER — DILTIAZEM HCL 25 MG/5ML IV SOLN
10.0000 mg | Freq: Once | INTRAVENOUS | Status: AC
  Administered 2020-06-09: 10 mg via INTRAVENOUS

## 2020-06-09 MED ORDER — NITROGLYCERIN 0.4 MG SL SUBL
0.4000 mg | SUBLINGUAL_TABLET | Freq: Once | SUBLINGUAL | Status: AC
  Administered 2020-06-09: 0.4 mg via SUBLINGUAL

## 2020-06-09 NOTE — Progress Notes (Signed)
Patient tolerated procedure well. Ambulate w/o difficulty. Sitting in chair drinking. No needs Dressed by herself. All questions answered. ABC intact. Discharge from procedure area w/o issues. Encouraged to drink extra water today.

## 2020-06-10 ENCOUNTER — Ambulatory Visit (INDEPENDENT_AMBULATORY_CARE_PROVIDER_SITE_OTHER): Payer: No Typology Code available for payment source

## 2020-06-10 DIAGNOSIS — R079 Chest pain, unspecified: Secondary | ICD-10-CM

## 2020-06-10 DIAGNOSIS — E7800 Pure hypercholesterolemia, unspecified: Secondary | ICD-10-CM | POA: Diagnosis not present

## 2020-06-10 DIAGNOSIS — I10 Essential (primary) hypertension: Secondary | ICD-10-CM | POA: Diagnosis not present

## 2020-06-10 DIAGNOSIS — R072 Precordial pain: Secondary | ICD-10-CM | POA: Diagnosis not present

## 2020-06-10 LAB — ECHOCARDIOGRAM COMPLETE
AR max vel: 3.13 cm2
AV Area VTI: 3.43 cm2
AV Area mean vel: 3.16 cm2
AV Mean grad: 4 mmHg
AV Peak grad: 8.2 mmHg
Ao pk vel: 1.43 m/s
Area-P 1/2: 3.17 cm2
Calc EF: 63.8 %
S' Lateral: 1.5 cm
Single Plane A2C EF: 59.1 %
Single Plane A4C EF: 70.6 %

## 2020-06-20 ENCOUNTER — Telehealth: Payer: Self-pay | Admitting: Gastroenterology

## 2020-06-20 NOTE — Telephone Encounter (Signed)
Patient states she wants to reschedule her procedure from 10.8.21 to Jan or Feb 2022. Please cancel procedure and call pt back to resch.

## 2020-06-21 NOTE — Telephone Encounter (Signed)
Left vm informing pt we have cancelled her procedure per her request and will contact her back in January to reschedule.

## 2020-07-01 ENCOUNTER — Ambulatory Visit (INDEPENDENT_AMBULATORY_CARE_PROVIDER_SITE_OTHER): Payer: No Typology Code available for payment source | Admitting: Cardiology

## 2020-07-01 ENCOUNTER — Other Ambulatory Visit: Payer: Self-pay

## 2020-07-01 ENCOUNTER — Encounter: Payer: Self-pay | Admitting: Cardiology

## 2020-07-01 VITALS — BP 138/72 | HR 88 | Ht 65.0 in | Wt 187.2 lb

## 2020-07-01 DIAGNOSIS — R079 Chest pain, unspecified: Secondary | ICD-10-CM | POA: Diagnosis not present

## 2020-07-01 DIAGNOSIS — I10 Essential (primary) hypertension: Secondary | ICD-10-CM | POA: Diagnosis not present

## 2020-07-01 DIAGNOSIS — Z01818 Encounter for other preprocedural examination: Secondary | ICD-10-CM | POA: Diagnosis not present

## 2020-07-01 DIAGNOSIS — E7800 Pure hypercholesterolemia, unspecified: Secondary | ICD-10-CM

## 2020-07-01 MED ORDER — EZETIMIBE 10 MG PO TABS: 10.0000 mg | ORAL_TABLET | Freq: Every day | ORAL | 5 refills | Status: DC

## 2020-07-01 NOTE — Progress Notes (Signed)
Cardiology Office Note:    Date:  07/01/2020   ID:  Debra [name], DOB 1956-02-20, MRN [id number]  PCP:  Mar Daring, PA-C  [facility] Cardiologist:  Kate Sable, MD  [place] Electrophysiologist:  None   Referring MD: Florian Buff*   Chief Complaint  Patient presents with  . other    Follow up Echo & CT scan. Meds reviewed by the pt. verbally. "doing well."     History of Present Illness:    Debra [name] is a 64 y.o. female with a hx of hypertension, hyperlipidemia who presents for follow-up.  Patient last seen due to chest pain on and off for over a year.  Symptoms were atypical but due to risk factors, coronary CTA was ordered to evaluate CAD, echocardiogram also ordered.  She now presents for results, states doing well, has no concerns at this time.  Denies shortness of breath, palpitations, dizziness, syncope.    She has history of hyperlipidemia, intolerant to statins/Pravachol which is the least myalgia causing statin.  PCSK9/flutter when was ordered but for some reason her insurance will not approve this medication.  The office is working on authorization.  Planning on getting a screening colonoscopy sometime in the next several months.   Past Medical History:  Diagnosis Date  . Hyperlipidemia   . Hypertension   . Lumbar back pain   . Pre-diabetes    during pregnancy     Past Surgical History:  Procedure Laterality Date  . ABDOMINAL HYSTERECTOMY  1998  . LUMBAR LAMINECTOMY/DECOMPRESSION MICRODISCECTOMY Left 10/16/2017   Procedure: Microlumbar decompression L4-5, L5-S1 left;  Surgeon: Susa Day, MD;  Location: [facility];  Service: Orthopedics;  Laterality: Left;  120 mins  . RHINOPLASTY  1988  . SHOULDER SURGERY Right 2011  . TEMPOROMANDIBULAR JOINT SURGERY  1990    Current Medications: Current Meds  Medication Sig  . Biotin 1000 MCG CHEW Chew 1 each by mouth daily.  . Cholecalciferol (VITAMIN D3 PO) Take 1,500 Units by  mouth daily.  Marland Kitchen EPINEPHrine 0.3 mg/0.3 mL IJ SOAJ injection INJECT 1 PEN IN THIGH MUSCLE ONE TIME FOR ALLERGIC REACTION. MAY REPEAT IN 5 TO 15 MINUTES IF NEEDED  . famotidine (PEPCID) 20 MG tablet Take 20 mg by mouth 2 (two) times daily.  . fexofenadine (ALLEGRA ALLERGY) 180 MG tablet Take 180 mg by mouth daily.  . hydrochlorothiazide (HYDRODIURIL) 25 MG tablet Take 1 tablet (25 mg total) by mouth daily.  Marland Kitchen losartan (COZAAR) 50 MG tablet Take 1 tablet (50 mg total) by mouth daily.  . Melatonin 10 MG TABS Take 1 tablet by mouth at bedtime.      Allergies:   Latex, Other, Pravastatin sodium, and Shellfish allergy   Social History   Socioeconomic History  . Marital status: Married    Spouse name: Not on file  . Number of children: Not on file  . Years of education: Not on file  . Highest education level: Not on file  Occupational History  . Not on file  Tobacco Use  . Smoking status: Former Smoker    Types: Cigarettes  . Smokeless tobacco: Never Used  . Tobacco comment: 30 years quit   Vaping Use  . Vaping Use: Never used  Substance and Sexual Activity  . Alcohol use: Yes    Alcohol/week: 0.0 standard drinks    Comment: OCCASIONALLY  . Drug use: No  . Sexual activity: Not on file  Other Topics Concern  . Not  on file  Social History Narrative  . Not on file   Social Determinants of Health   Financial Resource Strain:   . Difficulty of Paying Living Expenses: Not on file  Food Insecurity:   . Worried About Charity fundraiser in the Last Year: Not on file  . Ran Out of Food in the Last Year: Not on file  Transportation Needs:   . Lack of Transportation (Medical): Not on file  . Lack of Transportation (Non-Medical): Not on file  Physical Activity:   . Days of Exercise per Week: Not on file  . Minutes of Exercise per Session: Not on file  Stress:   . Feeling of Stress : Not on file  Social Connections:   . Frequency of Communication with Friends and Family: Not on file   . Frequency of Social Gatherings with Friends and Family: Not on file  . Attends Religious Services: Not on file  . Active Member of Clubs or Organizations: Not on file  . Attends Archivist Meetings: Not on file  . Marital Status: Not on file     Family History: The patient's family history includes Alcohol abuse in her mother; Breast cancer (age of onset: 72) in her mother; Hyperlipidemia in her brother, daughter, and sister. She was adopted.  ROS:   Please see the history of present illness.     All other systems reviewed and are negative.  EKGs/Labs/Other Studies Reviewed:    The following studies were reviewed today:   EKG:  EKG is  ordered today.  The ekg ordered today demonstrates normal sinus rhythm  Recent Labs: 05/11/2020: ALT 37; BUN 11; Creatinine, Ser 0.69; Hemoglobin 14.5; Platelets 371; Potassium 4.4; Sodium 140; TSH 1.980  Recent Lipid Panel    Component Value Date/Time   CHOL 245 (H) 05/11/2020 0827   TRIG 200 (H) 05/11/2020 0827   HDL 53 05/11/2020 0827   CHOLHDL 4.6 (H) 05/11/2020 0827   CHOLHDL 4.4 08/16/2017 0801   LDLCALC 156 (H) 05/11/2020 0827   LDLCALC 171 (H) 08/16/2017 0801    Physical Exam:    VS:  BP 138/72 (BP Location: Left Arm, Patient Position: Sitting, Cuff Size: Normal)   Pulse 88   Ht 5\' 5"  (1.651 m)   Wt 187 lb 4 oz (84.9 kg)   SpO2 98%   BMI 31.16 kg/m     Wt Readings from Last 3 Encounters:  07/01/20 187 lb 4 oz (84.9 kg)  05/13/20 187 lb (84.8 kg)  05/06/20 186 lb (84.4 kg)     GEN:  Well nourished, well developed in no acute distress HEENT: Normal NECK: No JVD; No carotid bruits LYMPHATICS: No lymphadenopathy CARDIAC: RRR, no murmurs, rubs, gallops RESPIRATORY:  Clear to auscultation without rales, wheezing or rhonchi  ABDOMEN: Soft, non-tender, non-distended MUSCULOSKELETAL:  No edema; No deformity  SKIN: Warm and dry NEUROLOGIC:  Alert and oriented x 3 PSYCHIATRIC:  Normal affect   ASSESSMENT:    1.  Chest pain of uncertain etiology   2. Pre-op evaluation   3. Essential hypertension   4. Pure hypercholesterolemia    PLAN:    In order of problems listed above:  1. Patient with symptoms of chest pain.  Risk factors of age, hypertension, hyperlipidemia.  Echocardiogram showed normal systolic and diastolic function, EF 60 to 65%.  Coronary CTA had a calcium score of 0, no evidence of CAD.  Patient made aware of results and reassured.  2. Patient is planning on obtaining  screening colonoscopy over the next several months.  Patient can undergo colonoscopy from a cardiac perspective without any additional testing.  Echocardiogram and coronary CTA was normal as mentioned above. 3. Patient with history of hypertension, controlled.  Continue HCTZ and losartan. 4. Patient with history of hyperlipidemia, not tolerant to statins.  Last total cholesterol and LDL not at goal.  Insurance not approving PCSK9 for unknown reasons.  Start Zetia 10 mg daily.  Check fasting lipid profile in 3 months.  Low-cholesterol diet and exercise advised.  Follow-up in 3 months  Total encounter time 40 minutes  Greater than 50% was spent in counseling and coordination of care with the patient   This note was generated in part or whole with voice recognition software. Voice recognition is usually quite accurate but there are transcription errors that can and very often do occur. I apologize for any typographical errors that were not detected and corrected.  Medication Adjustments/Labs and Tests Ordered: Current medicines are reviewed at length with the patient today.  Concerns regarding medicines are outlined above.  Orders Placed This Encounter  Procedures  . Lipid panel  . EKG 12-Lead   Meds ordered this encounter  Medications  . ezetimibe (ZETIA) 10 MG tablet    Sig: Take 1 tablet (10 mg total) by mouth daily.    Dispense:  30 tablet    Refill:  5    Patient Instructions  Medication Instructions:  Your  physician has recommended you make the following change in your medication:   START taking ezetimibe (ZETIA) 10 MG tablet: Take 1 tablet (10 mg total) by mouth daily    *If you need a refill on your cardiac medications before your next appointment, please call your pharmacy*   Lab Work:    Your physician recommends that you return for a FASTING lipid profile: 1 week prior to your 3 month follow up. - You will need to be fasting. Please do not have anything to eat or drink after midnight the morning you have the lab work. You may only have water or black coffee with no cream or sugar. - Please go to the [facility]. You will check in at the front desk to the right as you walk into the atrium. Valet Parking is offered if needed.   Testing/Procedures: None Ordered   Follow-Up: At [facility], you and your health needs are our priority.  As part of our continuing mission to provide you with exceptional heart care, we have created designated Provider Care Teams.  These Care Teams include your primary Cardiologist (physician) and Advanced Practice Providers (APPs -  Physician Assistants and Nurse Practitioners) who all work together to provide you with the care you need, when you need it.  We recommend signing up for the patient portal called "MyChart".  Sign up information is provided on this After Visit Summary.  MyChart is used to connect with patients for Virtual Visits (Telemedicine).  Patients are able to view lab/test results, encounter notes, upcoming appointments, etc.  Non-urgent messages can be sent to your provider as well.   To learn more about what you can do with MyChart, go to [email or web address].    Your next appointment:   3 month(s)  The format for your next appointment:   In Person  Provider:   Kate Sable, MD ONLY   Other Instructions      Signed, Kate Sable, MD  07/01/2020 4:29 PM    [place]

## 2020-07-01 NOTE — Patient Instructions (Signed)
Medication Instructions:  Your physician has recommended you make the following change in your medication:   START taking ezetimibe (ZETIA) 10 MG tablet: Take 1 tablet (10 mg total) by mouth daily    *If you need a refill on your cardiac medications before your next appointment, please call your pharmacy*   Lab Work:    Your physician recommends that you return for a FASTING lipid profile: 1 week prior to your 3 month follow up. - You will need to be fasting. Please do not have anything to eat or drink after midnight the morning you have the lab work. You may only have water or black coffee with no cream or sugar. - Please go to the [facility]. You will check in at the front desk to the right as you walk into the atrium. Valet Parking is offered if needed.   Testing/Procedures: None Ordered   Follow-Up: At [facility], you and your health needs are our priority.  As part of our continuing mission to provide you with exceptional heart care, we have created designated Provider Care Teams.  These Care Teams include your primary Cardiologist (physician) and Advanced Practice Providers (APPs -  Physician Assistants and Nurse Practitioners) who all work together to provide you with the care you need, when you need it.  We recommend signing up for the patient portal called "MyChart".  Sign up information is provided on this After Visit Summary.  MyChart is used to connect with patients for Virtual Visits (Telemedicine).  Patients are able to view lab/test results, encounter notes, upcoming appointments, etc.  Non-urgent messages can be sent to your provider as well.   To learn more about what you can do with MyChart, go to [email or web address].    Your next appointment:   3 month(s)  The format for your next appointment:   In Person  Provider:   Kate Sable, MD ONLY   Other Instructions

## 2020-07-01 NOTE — Telephone Encounter (Signed)
   Primary Cardiologist: Kate Sable, MD  Chart reviewed as part of pre-operative protocol coverage. Given past medical history and time since last visit, based on ACC/AHA guidelines, Debra [name] would be at acceptable risk for the planned procedure without further cardiovascular testing.   I will route this recommendation to the requesting party via Epic fax function and remove from pre-op pool.  Please call with questions.  Phill Myron. [place] Pugh, ANP, AACC  07/01/2020, 4:38 PM

## 2020-07-15 ENCOUNTER — Ambulatory Visit: Admit: 2020-07-15 | Payer: No Typology Code available for payment source | Admitting: Gastroenterology

## 2020-07-15 SURGERY — COLONOSCOPY WITH PROPOFOL
Anesthesia: Choice

## 2020-09-06 ENCOUNTER — Telehealth: Payer: Self-pay | Admitting: Cardiology

## 2020-09-06 ENCOUNTER — Other Ambulatory Visit: Payer: Self-pay | Admitting: Physician Assistant

## 2020-09-06 MED ORDER — FAMOTIDINE 20 MG PO TABS: 20.0000 mg | ORAL_TABLET | Freq: Two times a day (BID) | ORAL | 0 refills | Status: DC

## 2020-09-06 NOTE — Telephone Encounter (Signed)
Called patient and discussed that our recourds show we never prescribed this medication. I do see that it was previously prescribed by her PCP office Fenton Malling PA-C. I advised the patient to follow up with her PCP. Patient verbalized understanding and agreed with plan.

## 2020-09-06 NOTE — Telephone Encounter (Signed)
Medication Refill - Medication: omeprazole   Has the patient contacted their pharmacy? Yes.   Pt states that this medication is working great. Please advise.  (Agent: If no, request that the patient contact the pharmacy for the refill.) (Agent: If yes, when and what did the pharmacy advise?)  Preferred Pharmacy (with phone number or street name):  [id number] - [address], [place] [address]  [address] [place] [address]  Phone: [phone] Fax: [phone]  Hours: Not open 24 hours     Agent: Please be advised that RX refills may take up to 3 business days. We ask that you follow-up with your pharmacy.

## 2020-09-06 NOTE — Telephone Encounter (Signed)
Patient calling  States that she would like a refill for omeprazole medication - not on medication list  Please call to discuss and clarify  Would need refill sent to [facility] in [place]

## 2020-09-06 NOTE — Telephone Encounter (Signed)
Requested medication (s) are due for refill today:   Not sure  Requested medication (s) are on the active medication list:   Yes  Future visit scheduled:   No   Last ordered: 05/13/2020 by a historical provider  Pt called in a refill request for Omeprazole 20 mg.   Pepcid 20 mg is listed on her med. List.   I asked her to check the bottle and it's for Omeprazole not Pepcid.   [facility] due to her insurance and she said the pills looked different from what she was getting [facility].    I wasn't sure which medication Pepcid or Omeprazole she is supposed to be on and the pt wasn't sure either.  She did mention the omeprazole from [facility] was working good.     Requested Prescriptions  Pending Prescriptions Disp Refills   famotidine (PEPCID) 20 MG tablet 60 tablet 0    Sig: Take 1 tablet (20 mg total) by mouth 2 (two) times daily.      Gastroenterology:  H2 Antagonists Passed - 09/06/2020  1:51 PM      Passed - Valid encounter within last 12 months    Recent Outpatient Visits           4 months ago Annual physical exam   [facility] [place], [place]   11 months ago Gastroesophageal reflux disease, unspecified whether esophagitis present   [facility], Dionne Bucy, [place]   1 year ago Other chest pain   [facility] [place], Dionne Bucy, MD   1 year ago Acute non-recurrent ethmoidal sinusitis   [place], [place]   1 year ago Acute non-recurrent ethmoidal sinusitis   [facility], Clearnce Sorrel, [place]       Future Appointments             In 2 weeks Agbor-Etang, Aaron Edelman, MD [facility]

## 2020-09-13 ENCOUNTER — Telehealth: Payer: Self-pay

## 2020-09-13 NOTE — Telephone Encounter (Signed)
Please review.  Is it okay to send in Omeprazole?  (She is also taking famotidine)  Thanks,    -Mickel Baas

## 2020-09-13 NOTE — Telephone Encounter (Signed)
Patient called last week to get refills on Omeprazole but the messages went to her cardiologist instead of Jenni. She really needs it.  Can you please get this sent in today?

## 2020-09-14 MED ORDER — OMEPRAZOLE 20 MG PO CPDR: 20.0000 mg | DELAYED_RELEASE_CAPSULE | Freq: Every day | ORAL | 3 refills | Status: DC

## 2020-09-14 NOTE — Telephone Encounter (Signed)
Omeprazole sent to [facility]

## 2020-09-23 ENCOUNTER — Ambulatory Visit: Payer: No Typology Code available for payment source | Admitting: Cardiology

## 2020-09-26 ENCOUNTER — Encounter: Payer: Self-pay | Admitting: Cardiology

## 2020-10-03 ENCOUNTER — Other Ambulatory Visit: Payer: Self-pay

## 2020-10-03 ENCOUNTER — Ambulatory Visit (INDEPENDENT_AMBULATORY_CARE_PROVIDER_SITE_OTHER): Payer: No Typology Code available for payment source | Admitting: Cardiology

## 2020-10-03 ENCOUNTER — Other Ambulatory Visit
Admission: RE | Admit: 2020-10-03 | Discharge: 2020-10-03 | Disposition: A | Discharge status: Home / Self Care | Payer: No Typology Code available for payment source | Attending: Cardiology | Admitting: Cardiology

## 2020-10-03 ENCOUNTER — Encounter: Payer: Self-pay | Admitting: Cardiology

## 2020-10-03 VITALS — BP 118/80 | HR 86 | Ht 65.0 in | Wt 185.0 lb

## 2020-10-03 DIAGNOSIS — I10 Essential (primary) hypertension: Secondary | ICD-10-CM | POA: Diagnosis not present

## 2020-10-03 DIAGNOSIS — E7800 Pure hypercholesterolemia, unspecified: Secondary | ICD-10-CM | POA: Diagnosis present

## 2020-10-03 LAB — LIPID PANEL
Cholesterol: 231 mg/dL — ABNORMAL HIGH (ref 0–200)
HDL: 49 mg/dL (ref >40)
LDL Cholesterol: 123 mg/dL — ABNORMAL HIGH (ref 0–99)
Total CHOL/HDL Ratio: 4.7 RATIO
Triglycerides: 295 mg/dL — ABNORMAL HIGH (ref <150)
VLDL: 59 mg/dL — ABNORMAL HIGH (ref 0–40)

## 2020-10-03 MED ORDER — FENOFIBRATE 145 MG PO TABS: 145.0000 mg | ORAL_TABLET | Freq: Every day | ORAL | 5 refills | Status: DC

## 2020-10-03 NOTE — Patient Instructions (Signed)
Medication Instructions:   START Fenofibrate 145 MG: Take one tab by mouth daily.  *If you need a refill on your cardiac medications before your next appointment, please call your pharmacy*   Lab Work:  Your physician recommends that you return for a FASTING lipid profile:  In 3 months.  - You will need to be fasting. Please do not have anything to eat or drink after midnight the morning you have the lab work. You may only have water or black coffee with no cream or sugar. - Please go to the [facility]. You will check in at the front desk to the right as you walk into the atrium. Valet Parking is offered if needed. - No appointment needed. You may go any day between 7 am and 6 pm.    Testing/Procedures: None Ordered   Follow-Up: At [facility], you and your health needs are our priority.  As part of our continuing mission to provide you with exceptional heart care, we have created designated Provider Care Teams.  These Care Teams include your primary Cardiologist (physician) and Advanced Practice Providers (APPs -  Physician Assistants and Nurse Practitioners) who all work together to provide you with the care you need, when you need it.  We recommend signing up for the patient portal called "MyChart".  Sign up information is provided on this After Visit Summary.  MyChart is used to connect with patients for Virtual Visits (Telemedicine).  Patients are able to view lab/test results, encounter notes, upcoming appointments, etc.  Non-urgent messages can be sent to your provider as well.   To learn more about what you can do with MyChart, go to [email or web address].    Your next appointment:   3 month(s)  The format for your next appointment:   In Person  Provider:   Debbe Odea, MD ONLY   Other Instructions

## 2020-10-03 NOTE — Progress Notes (Signed)
Cardiology Office Note:    Date:  10/03/2020   ID:  Debra [name], DOB [birth year], MRN [id number]  PCP:  Mar Daring, PA-C  [facility] Cardiologist:  Kate Sable, MD  [place] Electrophysiologist:  None   Referring MD: Florian Buff*   Chief Complaint  Patient presents with  . Other    3 month f/u no complaints today. Meds reviewed verbally with pt.    History of Present Illness:    Debra [name] is a 64 y.o. female with a hx of hypertension, hyperlipidemia who presents for follow-up.   Previously seen for chest pain, echo and coronary CTA were normal.  Last lipid panel was noted to be elevated.  She is intolerant to statins, insurance did not approve PCSK9's.  Started on Zetia, had repeat lipid panel obtained.  Now presents for results.  Has no complaints today.  She is compliant with her medications, trying to exercise as much as she can.  Has no side effects from Zetia.  States having a lot of butter in her diet.  Prior notes Echocardiogram 0000000 normal systolic and diastolic function, EF 60 to 65% Coronary CTA 06/2020 normal, no evidence of CAD.   Past Medical History:  Diagnosis Date  . Hyperlipidemia   . Hypertension   . Lumbar back pain   . Pre-diabetes    during pregnancy     Past Surgical History:  Procedure Laterality Date  . ABDOMINAL HYSTERECTOMY  1998  . LUMBAR LAMINECTOMY/DECOMPRESSION MICRODISCECTOMY Left 10/16/2017   Procedure: Microlumbar decompression L4-5, L5-S1 left;  Surgeon: Susa Day, MD;  Location: [facility];  Service: Orthopedics;  Laterality: Left;  120 mins  . RHINOPLASTY  1988  . SHOULDER SURGERY Right 2011  . TEMPOROMANDIBULAR JOINT SURGERY  1990    Current Medications: Current Meds  Medication Sig  . Biotin 1000 MCG CHEW Chew 1 each by mouth daily.  . Cholecalciferol (VITAMIN D3 PO) Take 1,500 Units by mouth daily.  Marland Kitchen EPINEPHrine 0.3 mg/0.3 mL IJ SOAJ injection INJECT 1 PEN IN THIGH MUSCLE ONE TIME  FOR ALLERGIC REACTION. MAY REPEAT IN 5 TO 15 MINUTES IF NEEDED  . ezetimibe (ZETIA) 10 MG tablet Take 1 tablet (10 mg total) by mouth daily.  . famotidine (PEPCID) 20 MG tablet Take 1 tablet (20 mg total) by mouth 2 (two) times daily.  . fenofibrate (TRICOR) 145 MG tablet Take 1 tablet (145 mg total) by mouth daily.  . fexofenadine (ALLEGRA) 180 MG tablet Take 180 mg by mouth daily.  . hydrochlorothiazide (HYDRODIURIL) 25 MG tablet Take 1 tablet (25 mg total) by mouth daily.  Marland Kitchen losartan (COZAAR) 50 MG tablet Take 1 tablet (50 mg total) by mouth daily.  . Melatonin 10 MG TABS Take 1 tablet by mouth at bedtime.   Marland Kitchen omeprazole (PRILOSEC) 20 MG capsule Take 1 capsule (20 mg total) by mouth daily.     Allergies:   Latex, Other, Pravastatin sodium, and Shellfish allergy   Social History   Socioeconomic History  . Marital status: Married    Spouse name: Not on file  . Number of children: Not on file  . Years of education: Not on file  . Highest education level: Not on file  Occupational History  . Not on file  Tobacco Use  . Smoking status: Former Smoker    Types: Cigarettes  . Smokeless tobacco: Never Used  . Tobacco comment: 30 years quit   Vaping Use  . Vaping Use: Never used  Substance and Sexual Activity  . Alcohol use: Yes    Alcohol/week: 0.0 standard drinks    Comment: OCCASIONALLY  . Drug use: No  . Sexual activity: Not on file  Other Topics Concern  . Not on file  Social History Narrative  . Not on file   Social Determinants of Health   Financial Resource Strain: Not on file  Food Insecurity: Not on file  Transportation Needs: Not on file  Physical Activity: Not on file  Stress: Not on file  Social Connections: Not on file     Family History: The patient's family history includes Alcohol abuse in her mother; Breast cancer (age of onset: 75) in her mother; Hyperlipidemia in her brother, daughter, and sister. She was adopted.  ROS:   Please see the history of  present illness.     All other systems reviewed and are negative.  EKGs/Labs/Other Studies Reviewed:    The following studies were reviewed today:   EKG:  EKG not ordered today.    Recent Labs: 05/11/2020: ALT 37; BUN 11; Creatinine, Ser 0.69; Hemoglobin 14.5; Platelets 371; Potassium 4.4; Sodium 140; TSH 1.980  Recent Lipid Panel    Component Value Date/Time   CHOL 231 (H) 10/03/2020 0754   CHOL 245 (H) 05/11/2020 0827   TRIG 295 (H) 10/03/2020 0754   HDL 49 10/03/2020 0754   HDL 53 05/11/2020 0827   CHOLHDL 4.7 10/03/2020 0754   VLDL 59 (H) 10/03/2020 0754   LDLCALC 123 (H) 10/03/2020 0754   LDLCALC 156 (H) 05/11/2020 0827   LDLCALC 171 (H) 08/16/2017 0801    Physical Exam:    VS:  BP 118/80 (BP Location: Left Arm, Patient Position: Sitting, Cuff Size: Normal)   Pulse 86   Ht 5\' 5"  (1.651 m)   Wt 185 lb (83.9 kg)   SpO2 97%   BMI 30.79 kg/m     Wt Readings from Last 3 Encounters:  10/03/20 185 lb (83.9 kg)  07/01/20 187 lb 4 oz (84.9 kg)  05/13/20 187 lb (84.8 kg)     GEN:  Well nourished, well developed in no acute distress HEENT: Normal NECK: No JVD; No carotid bruits LYMPHATICS: No lymphadenopathy CARDIAC: RRR, no murmurs, rubs, gallops RESPIRATORY:  Clear to auscultation without rales, wheezing or rhonchi  ABDOMEN: Soft, non-tender, non-distended MUSCULOSKELETAL:  No edema; No deformity  SKIN: Warm and dry NEUROLOGIC:  Alert and oriented x 3 PSYCHIATRIC:  Normal affect   ASSESSMENT:    1. Essential hypertension   2. Pure hypercholesterolemia    PLAN:    In order of problems listed above:  1. hypertension, BP controlled.  Continue HCTZ and losartan. 2. Patient with history of hyperlipidemia, not tolerant to statins.  Insurance did not approve PCSK9.  Last lipid panel with improvement in total and LDL cholesterol although still elevated.  Triglycerides are worse.  Continue Zetia, start fenofibrate.  Repeat fasting lipid profile in 3 months.   Low-cholesterol diet advised, advised to cut back on water.  Follow-up in 3 months  Total encounter time 40 minutes  Greater than 50% was spent in counseling and coordination of care with the patient   This note was generated in part or whole with voice recognition software. Voice recognition is usually quite accurate but there are transcription errors that can and very often do occur. I apologize for any typographical errors that were not detected and corrected.  Medication Adjustments/Labs and Tests Ordered: Current medicines are reviewed at length with the patient  today.  Concerns regarding medicines are outlined above.  Orders Placed This Encounter  Procedures  . Lipid panel   Meds ordered this encounter  Medications  . fenofibrate (TRICOR) 145 MG tablet    Sig: Take 1 tablet (145 mg total) by mouth daily.    Dispense:  30 tablet    Refill:  5    Patient Instructions  Medication Instructions:   START Fenofibrate 145 MG: Take one tab by mouth daily.  *If you need a refill on your cardiac medications before your next appointment, please call your pharmacy*   Lab Work:  Your physician recommends that you return for a FASTING lipid profile:  In 3 months.  - You will need to be fasting. Please do not have anything to eat or drink after midnight the morning you have the lab work. You may only have water or black coffee with no cream or sugar. - Please go to the [facility] You will check in at the front desk to the right as you walk into the atrium. Valet Parking is offered if needed. - No appointment needed. You may go any day between 7 am and 6 pm.    Testing/Procedures: None Ordered   Follow-Up: At [facility], you and your health needs are our priority.  As part of our continuing mission to provide you with exceptional heart care, we have created designated Provider Care Teams.  These Care Teams include your primary Cardiologist (physician) and Advanced  Practice Providers (APPs -  Physician Assistants and Nurse Practitioners) who all work together to provide you with the care you need, when you need it.  We recommend signing up for the patient portal called "MyChart".  Sign up information is provided on this After Visit Summary.  MyChart is used to connect with patients for Virtual Visits (Telemedicine).  Patients are able to view lab/test results, encounter notes, upcoming appointments, etc.  Non-urgent messages can be sent to your provider as well.   To learn more about what you can do with MyChart, go to [email or web address].    Your next appointment:   3 month(s)  The format for your next appointment:   In Person  Provider:   Debbe Odea, MD ONLY   Other Instructions      Signed, Debbe Odea, MD  10/03/2020 4:44 PM    [facility]

## 2020-10-05 ENCOUNTER — Telehealth: Payer: Self-pay | Admitting: Cardiology

## 2020-10-05 NOTE — Telephone Encounter (Signed)
Please call to discuss Tricor. Pt c/o medication issue:  1. Name of Medication: Tricor  2. How are you currently taking this medication (dosage and times per day)? 145 mg daily  3. Are you having a reaction (difficulty breathing--STAT)?  "horrible back ache and bilateral calf pain  4. What is your medication issue?

## 2020-10-06 NOTE — Telephone Encounter (Signed)
Called patient and gave her the below copied and pasted recommendation:  Debbe Odea, MD  You 22 hours ago (5:27 PM)   Okay to stop fenofibrate due to muscle aches. Continue low-cholesterol diet. Repeat lipid panel in about 3 months as previously scheduled. Thank you   Routing comment    Patient stated that the fenofibrate caused severe back pain that was worse than her reaction to statins. I added it to her allergy list. Patent verbalized understanding and agreed with plan.

## 2020-10-12 ENCOUNTER — Other Ambulatory Visit: Payer: Self-pay

## 2020-10-12 DIAGNOSIS — Z1211 Encounter for screening for malignant neoplasm of colon: Secondary | ICD-10-CM

## 2020-10-12 NOTE — Progress Notes (Signed)
MyChart Video Visit    Virtual Visit via Video Note   This visit type was conducted due to national recommendations for restrictions regarding the COVID-19 Pandemic (e.g. social distancing) in an effort to limit this patient's exposure and mitigate transmission in our community. This patient is at least at moderate risk for complications without adequate follow up. This format is felt to be most appropriate for this patient at this time. Physical exam was limited by quality of the video and audio technology used for the visit.   Patient location: Home Provider location: Office   I discussed the limitations of evaluation and management by telemedicine and the availability of in person appointments. The patient expressed understanding and agreed to proceed.  Patient: Debra [name]   DOB: [birth year]   65 y.o. Female  MRN: [id number] Visit Date: 10/13/2020  Today's healthcare provider: Trinna Post, PA-C   Chief Complaint  Patient presents with  . URI  I,Jensen Cheramie M Wilene Pharo,acting as a scribe for [employer], PA-C.,have documented all relevant documentation on the behalf of Trinna Post, PA-C,as directed by  Trinna Post, PA-C while in the presence of Trinna Post, PA-C.  Subjective    URI  This is a new problem. The current episode started in the past 7 days. The problem has been unchanged. There has been no fever. Associated symptoms include congestion, coughing, ear pain, headaches, rhinorrhea, sinus pain and sneezing. Pertinent negatives include no sore throat or wheezing. She has tried acetaminophen, decongestant and antihistamine for the symptoms. The treatment provided mild relief.    Patient reports sinus congestion since 10/07/2020. She reports fever Friday night when symptoms started. Ears hurting, sinuses swollen. [facility] COVID [facility] 10[facility]09/2020 but has not heard about results yet.      Medications: Outpatient Medications  Prior to Visit  Medication Sig  . Biotin 1000 MCG CHEW Chew 1 each by mouth daily.  . Cholecalciferol (VITAMIN D3 PO) Take 1,500 Units by mouth daily.  Marland Kitchen EPINEPHrine 0.3 mg/0.3 mL IJ SOAJ injection INJECT 1 PEN IN THIGH MUSCLE ONE TIME FOR ALLERGIC REACTION. MAY REPEAT IN 5 TO 15 MINUTES IF NEEDED  . famotidine (PEPCID) 20 MG tablet Take 1 tablet (20 mg total) by mouth 2 (two) times daily.  . fexofenadine (ALLEGRA) 180 MG tablet Take 180 mg by mouth daily.  . hydrochlorothiazide (HYDRODIURIL) 25 MG tablet Take 1 tablet (25 mg total) by mouth daily.  Marland Kitchen losartan (COZAAR) 50 MG tablet Take 1 tablet (50 mg total) by mouth daily.  . Melatonin 10 MG TABS Take 1 tablet by mouth at bedtime.   Marland Kitchen omeprazole (PRILOSEC) 20 MG capsule Take 1 capsule (20 mg total) by mouth daily.  Marland Kitchen ezetimibe (ZETIA) 10 MG tablet Take 1 tablet (10 mg total) by mouth daily.   No facility-administered medications prior to visit.    Review of Systems  Constitutional: Negative for chills, fatigue and fever.  HENT: Positive for congestion, ear pain, postnasal drip, rhinorrhea, sinus pressure, sinus pain and sneezing. Negative for sore throat.   Respiratory: Positive for cough. Negative for chest tightness, shortness of breath and wheezing.   Neurological: Positive for headaches. Negative for weakness.      Objective    There were no vitals taken for this visit.   Physical Exam Constitutional:      Appearance: Normal appearance.  Pulmonary:     Effort: Pulmonary effort is normal. No respiratory distress.  Neurological:     Mental Status: She is alert.  Psychiatric:        Mood and Affect: Mood normal.        Behavior: Behavior normal.        Assessment & Plan    1. Upper respiratory tract infection, unspecified type  - symptoms and exam c/w viral URI  - no evidence of strep pharyngitis, CAP, AOM, bacterial sinusitis, or other bacterial infection - concern for possible COVID19 infection - will send for  outpatient testing - discussed need to quarantine 10 days from start of symptoms and until fever-free for at least 48 hours - discussed need to quarantine household members - discussed symptomatic management, natural course, and return precautions -discussed supportive treatment, patient declines prednisone -advised of double sickening, when abx is appropriate  - promethazine-dextromethorphan (PROMETHAZINE-DM) 6.25-15 MG/5ML syrup; Take 5 mLs by mouth 3 (three) times daily as needed.  Dispense: 118 mL; Refill: 0    No follow-ups on file.     I discussed the assessment and treatment plan with the patient. The patient was provided an opportunity to ask questions and all were answered. The patient agreed with the plan and demonstrated an understanding of the instructions.   The patient was advised to call back or seek an in-person evaluation if the symptoms worsen or if the condition fails to improve as anticipated.   ITrey Sailors, PA-C, have reviewed all documentation for this visit. The documentation on 10/13/20 for the exam, diagnosis, procedures, and orders are all accurate and complete.  The entirety of the information documented in the History of Present Illness, Review of Systems and Physical Exam were personally obtained by me. Portions of this information were initially documented by [facility] and reviewed by me for thoroughness and accuracy.    Maryella Shivers [facility] [phone] (phone) [phone] (fax)  [facility]

## 2020-10-13 ENCOUNTER — Telehealth (INDEPENDENT_AMBULATORY_CARE_PROVIDER_SITE_OTHER): Payer: No Typology Code available for payment source | Admitting: Physician Assistant

## 2020-10-13 DIAGNOSIS — J069 Acute upper respiratory infection, unspecified: Secondary | ICD-10-CM

## 2020-10-13 MED ORDER — PROMETHAZINE-DM 6.25-15 MG/5ML PO SYRP
5.0000 mL | ORAL_SOLUTION | Freq: Three times a day (TID) | ORAL | 0 refills | Status: DC | PRN
Start: 2020-10-13 — End: 2021-01-09

## 2020-10-13 NOTE — Patient Instructions (Signed)

## 2020-10-17 ENCOUNTER — Telehealth: Payer: Self-pay | Admitting: Physician Assistant

## 2020-10-17 DIAGNOSIS — J0110 Acute frontal sinusitis, unspecified: Secondary | ICD-10-CM

## 2020-10-17 NOTE — Telephone Encounter (Signed)
Pt had virtual visit with Fabio Bering and was told if not better to call back. Pt states she is having still left ear throbbing pain and congestion.  Her left ear is also swollen.  Pt states she knows this is a sinus infection, as she gets this every year.  Pt would like the message sent to [facility] as well

## 2020-10-19 ENCOUNTER — Telehealth: Payer: Self-pay

## 2020-10-19 DIAGNOSIS — B379 Candidiasis, unspecified: Secondary | ICD-10-CM

## 2020-10-19 MED ORDER — AMOXICILLIN 875 MG PO TABS: 875.0000 mg | ORAL_TABLET | Freq: Two times a day (BID) | ORAL | 0 refills | Status: AC

## 2020-10-19 MED ORDER — FLUCONAZOLE 150 MG PO TABS: 150.0000 mg | ORAL_TABLET | Freq: Once | ORAL | 0 refills | Status: AC

## 2020-10-19 NOTE — Telephone Encounter (Signed)
Copied from [place] [id number]. Topic: General - Inquiry >> Oct 19, 2020 10:56 AM Gillis Ends D wrote: Reason for CRM: Patient called to say that she was prescribed amoxicillin and it gives her a yeast infection and would like for the Dr. To call her in some medication for the yeast infection. She can be reached at [phone] if you have any further questions. Please advise

## 2020-10-19 NOTE — Telephone Encounter (Signed)
7 days amoxicillin sent in.

## 2020-10-19 NOTE — Telephone Encounter (Signed)
Diflucan sent in

## 2020-10-19 NOTE — Telephone Encounter (Signed)
Called patient and no answer. Left a detailed vm that medication was send into pharmacy for patient.

## 2020-10-19 NOTE — Telephone Encounter (Signed)
Pt calling she states that her results did come back negative and she is still experiencing ear trouble. Please advise.

## 2020-10-26 ENCOUNTER — Other Ambulatory Visit: Payer: Self-pay | Admitting: Physician Assistant

## 2020-10-26 DIAGNOSIS — I10 Essential (primary) hypertension: Secondary | ICD-10-CM

## 2020-10-26 NOTE — Telephone Encounter (Signed)
Requested Prescriptions  Pending Prescriptions Disp Refills  . hydrochlorothiazide (HYDRODIURIL) 25 MG tablet [Pharmacy Med Name: HYDROCHLOROTHIAZIDE 25MG  TABLETS] 90 tablet 0    Sig: TAKE 1 TABLET BY MOUTH EVERY DAY     Cardiovascular: Diuretics - Thiazide Passed - 10/26/2020  8:00 AM      Passed - Ca in normal range and within 360 days    Calcium  Date Value Ref Range Status  05/11/2020 9.8 8.7 - 10.3 mg/dL Final         Passed - Cr in normal range and within 360 days    Creat  Date Value Ref Range Status  08/16/2017 0.80 0.50 - 1.05 mg/dL Final    Comment:    For patients >63 years of age, the reference limit for Creatinine is approximately 13% higher for people identified as African-American. .    Creatinine, Ser  Date Value Ref Range Status  05/11/2020 0.69 0.57 - 1.00 mg/dL Final         Passed - K in normal range and within 360 days    Potassium  Date Value Ref Range Status  05/11/2020 4.4 3.5 - 5.2 mmol/L Final         Passed - Na in normal range and within 360 days    Sodium  Date Value Ref Range Status  05/11/2020 140 134 - 144 mmol/L Final         Passed - Last BP in normal range    BP Readings from Last 1 Encounters:  10/03/20 118/80         Passed - Valid encounter within last 6 months    Recent Outpatient Visits          1 week ago Upper respiratory tract infection, unspecified type   [place], Wendee Beavers, PA-C   5 months ago Annual physical exam   [place], [place], [place]   1 year ago Gastroesophageal reflux disease, unspecified whether esophagitis present   [facility], Dionne Bucy, MD   1 year ago Other chest pain   [facility], Dionne Bucy, MD   1 year ago Acute non-recurrent ethmoidal sinusitis   [place], Clearnce Sorrel, [place]      Future Appointments            In 2 months Agbor-Etang, Aaron Edelman, MD [facility], [place]

## 2020-11-01 ENCOUNTER — Telehealth: Payer: Self-pay | Admitting: Cardiology

## 2020-11-01 NOTE — Telephone Encounter (Signed)
Patient called in to inquire about which medications she should be taking. She went to pharmacy and picked up fenofibrate along with ezetimibe. Reviewed list and it has fenofibrate listed under her allergies and lists back pain. She then recalled this information and states she does not tolerate statins. Reviewed that her ezetimibe is not a statin and that in some cases this can reduce numbers by 20%. Encouraged her to continue this medication and speak with her pharmacy about the fenofibrate. Advised that when medications are discontinued in our system it does not automatically discontinue at the pharmacy and she may want to have them update this in their system. Reviewed that if she should have any further questions to please give [place] a call back. She verbalized understanding of our conversation with no further needs at this time.

## 2020-11-01 NOTE — Telephone Encounter (Signed)
Patient has some questions regarding her cholesterol medication. States she has 2 at her pharmacy. Please call to discuss.

## 2020-12-13 ENCOUNTER — Telehealth: Payer: Self-pay | Admitting: Cardiology

## 2020-12-13 NOTE — Telephone Encounter (Signed)
Please call to discuss cholesterol medication. Patient states her pharmacy called and states she has a new medication ready.

## 2020-12-13 NOTE — Telephone Encounter (Signed)
Called and spoke with patient. She stated that the pharmacy refilled her Fenofibrate and we had stopped that medication in January 2022. I Called the pharmacy and had that removed from her prescriptions.  Patient was grateful for the call back.

## 2020-12-21 ENCOUNTER — Encounter: Payer: Self-pay | Admitting: Gastroenterology

## 2020-12-28 ENCOUNTER — Other Ambulatory Visit: Payer: Self-pay

## 2020-12-28 ENCOUNTER — Other Ambulatory Visit
Admission: RE | Admit: 2020-12-28 | Discharge: 2020-12-28 | Disposition: A | Discharge status: Home / Self Care | Payer: No Typology Code available for payment source | Source: Ambulatory Visit | Attending: Gastroenterology | Admitting: Gastroenterology

## 2020-12-28 DIAGNOSIS — Z20822 Contact with and (suspected) exposure to covid-19: Secondary | ICD-10-CM | POA: Diagnosis not present

## 2020-12-28 DIAGNOSIS — Z01812 Encounter for preprocedural laboratory examination: Secondary | ICD-10-CM | POA: Insufficient documentation

## 2020-12-28 LAB — SARS CORONAVIRUS 2 (TAT 6-24 HRS): SARS Coronavirus 2: NEGATIVE

## 2020-12-29 NOTE — Discharge Instructions (Signed)

## 2020-12-30 ENCOUNTER — Other Ambulatory Visit: Payer: Self-pay

## 2020-12-30 ENCOUNTER — Ambulatory Visit: Payer: No Typology Code available for payment source | Admitting: Anesthesiology

## 2020-12-30 ENCOUNTER — Encounter: Payer: Self-pay | Admitting: Gastroenterology

## 2020-12-30 ENCOUNTER — Ambulatory Visit
Admission: RE | Admit: 2020-12-30 | Discharge: 2020-12-30 | Disposition: A | Discharge status: Home / Self Care | Payer: No Typology Code available for payment source | Attending: Gastroenterology | Admitting: Gastroenterology

## 2020-12-30 ENCOUNTER — Encounter
Admission: RE | Disposition: A | Discharge status: Home / Self Care | Payer: Self-pay | Source: Home / Self Care | Attending: Gastroenterology

## 2020-12-30 DIAGNOSIS — Z888 Allergy status to other drugs, medicaments and biological substances status: Secondary | ICD-10-CM | POA: Insufficient documentation

## 2020-12-30 DIAGNOSIS — D122 Benign neoplasm of ascending colon: Secondary | ICD-10-CM | POA: Insufficient documentation

## 2020-12-30 DIAGNOSIS — Z79899 Other long term (current) drug therapy: Secondary | ICD-10-CM | POA: Insufficient documentation

## 2020-12-30 DIAGNOSIS — Z91013 Allergy to seafood: Secondary | ICD-10-CM | POA: Insufficient documentation

## 2020-12-30 DIAGNOSIS — Z803 Family history of malignant neoplasm of breast: Secondary | ICD-10-CM | POA: Insufficient documentation

## 2020-12-30 DIAGNOSIS — Z860100 Personal history of colon polyps, unspecified: Secondary | ICD-10-CM

## 2020-12-30 DIAGNOSIS — Z91040 Latex allergy status: Secondary | ICD-10-CM | POA: Diagnosis not present

## 2020-12-30 DIAGNOSIS — K640 First degree hemorrhoids: Secondary | ICD-10-CM | POA: Diagnosis not present

## 2020-12-30 DIAGNOSIS — K635 Polyp of colon: Secondary | ICD-10-CM | POA: Diagnosis not present

## 2020-12-30 DIAGNOSIS — Z87891 Personal history of nicotine dependence: Secondary | ICD-10-CM | POA: Diagnosis not present

## 2020-12-30 DIAGNOSIS — Z1211 Encounter for screening for malignant neoplasm of colon: Secondary | ICD-10-CM | POA: Insufficient documentation

## 2020-12-30 DIAGNOSIS — Z8349 Family history of other endocrine, nutritional and metabolic diseases: Secondary | ICD-10-CM | POA: Diagnosis not present

## 2020-12-30 DIAGNOSIS — K219 Gastro-esophageal reflux disease without esophagitis: Secondary | ICD-10-CM | POA: Insufficient documentation

## 2020-12-30 DIAGNOSIS — D125 Benign neoplasm of sigmoid colon: Secondary | ICD-10-CM | POA: Insufficient documentation

## 2020-12-30 HISTORY — DX: Gastro-esophageal reflux disease without esophagitis: K21.9

## 2020-12-30 HISTORY — DX: Allergy, unspecified, initial encounter: T78.40XA

## 2020-12-30 HISTORY — PX: COLONOSCOPY WITH PROPOFOL: SHX5780

## 2020-12-30 HISTORY — PX: POLYPECTOMY: SHX5525

## 2020-12-30 SURGERY — COLONOSCOPY WITH PROPOFOL
Anesthesia: General

## 2020-12-30 MED ORDER — STERILE WATER FOR IRRIGATION IR SOLN
Status: DC | PRN
  Administered 2020-12-30: 150 mL

## 2020-12-30 MED ORDER — OXYCODONE HCL 5 MG PO TABS: 5.0000 mg | ORAL_TABLET | Freq: Once | ORAL | Status: DC | PRN

## 2020-12-30 MED ORDER — PROPOFOL 10 MG/ML IV BOLUS
INTRAVENOUS | Status: DC | PRN
  Administered 2020-12-30 (×2): 40 mg via INTRAVENOUS
  Administered 2020-12-30: 20 mg via INTRAVENOUS
  Administered 2020-12-30: 40 mg via INTRAVENOUS
  Administered 2020-12-30: 150 mg via INTRAVENOUS
  Administered 2020-12-30: 30 mg via INTRAVENOUS

## 2020-12-30 MED ORDER — OXYCODONE HCL 5 MG/5ML PO SOLN: 5.0000 mg | Freq: Once | ORAL | Status: DC | PRN

## 2020-12-30 MED ORDER — LIDOCAINE HCL (CARDIAC) PF 100 MG/5ML IV SOSY
PREFILLED_SYRINGE | INTRAVENOUS | Status: DC | PRN
  Administered 2020-12-30: 50 mg via INTRAVENOUS

## 2020-12-30 MED ORDER — LACTATED RINGERS IV SOLN: INTRAVENOUS | Status: DC

## 2020-12-30 SURGICAL SUPPLY — 22 items
CLIP HMST 235XBRD CATH ROT (MISCELLANEOUS) IMPLANT
CLIP RESOLUTION 360 11X235 (MISCELLANEOUS)
ELECT REM PT RETURN 9FT ADLT (ELECTROSURGICAL)
ELECTRODE REM PT RTRN 9FT ADLT (ELECTROSURGICAL) IMPLANT
FORCEPS BIOP RAD 4 LRG CAP 4 (CUTTING FORCEPS) IMPLANT
GOWN CVR UNV OPN BCK APRN NK (MISCELLANEOUS) ×4 IMPLANT
GOWN ISOL THUMB LOOP REG UNIV (MISCELLANEOUS) ×6
INJECTOR VARIJECT VIN23 (MISCELLANEOUS) IMPLANT
KIT DEFENDO VALVE AND CONN (KITS) IMPLANT
KIT PRC NS LF DISP ENDO (KITS) ×2 IMPLANT
KIT PROCEDURE OLYMPUS (KITS) ×3
MANIFOLD NEPTUNE II (INSTRUMENTS) ×3 IMPLANT
MARKER SPOT ENDO TATTOO 5ML (MISCELLANEOUS) IMPLANT
PROBE APC STR FIRE (PROBE) IMPLANT
RETRIEVER NET ROTH 2.5X230 LF (MISCELLANEOUS) IMPLANT
SNARE COLD EXACTO (MISCELLANEOUS) ×3 IMPLANT
SNARE SHORT THROW 13M SML OVAL (MISCELLANEOUS) IMPLANT
SNARE SNG USE RND 15MM (INSTRUMENTS) IMPLANT
SPOT EX ENDOSCOPIC TATTOO (MISCELLANEOUS)
TRAP ETRAP POLY (MISCELLANEOUS) ×3 IMPLANT
VARIJECT INJECTOR VIN23 (MISCELLANEOUS)
WATER STERILE IRR 250ML POUR (IV SOLUTION) ×3 IMPLANT

## 2020-12-30 NOTE — Anesthesia Preprocedure Evaluation (Signed)
Anesthesia Evaluation  Patient identified by MRN, date of birth, ID band Patient awake    Reviewed: NPO status   History of Anesthesia Complications Negative for: history of anesthetic complications  Airway Mallampati: II  TM Distance: >3 FB Neck ROM: full    Dental no notable dental hx.    Pulmonary neg pulmonary ROS, former smoker,    Pulmonary exam normal        Cardiovascular Exercise Tolerance: Good hypertension, Normal cardiovascular exam     Neuro/Psych negative neurological ROS  negative psych ROS   GI/Hepatic Neg liver ROS, GERD  Controlled,  Endo/Other  Morbid obesity (bmi 29)  Renal/GU negative Renal ROS  negative genitourinary   Musculoskeletal   Abdominal   Peds  Hematology negative hematology ROS (+)   Anesthesia Other Findings Covid: NEG.  Reproductive/Obstetrics                             Anesthesia Physical Anesthesia Plan  ASA: II  Anesthesia Plan: General   Post-op Pain Management:    Induction:   PONV Risk Score and Plan: 3 and Propofol infusion, TIVA and Ondansetron  Airway Management Planned:   Additional Equipment:   Intra-op Plan:   Post-operative Plan:   Informed Consent: I have reviewed the patients History and Physical, chart, labs and discussed the procedure including the risks, benefits and alternatives for the proposed anesthesia with the patient or authorized representative who has indicated his/her understanding and acceptance.       Plan Discussed with: CRNA  Anesthesia Plan Comments:         Anesthesia Quick Evaluation

## 2020-12-30 NOTE — H&P (Signed)
Lucilla Lame, MD [facility] [address] [place], [place] [address] Phone:[phone] Fax : [phone]  Primary Care Physician:  Mar Daring, PA-C Primary Gastroenterologist:  Dr. Allen Norris  Pre-Procedure History & Physical: HPI:  Debra [name] is a 65 y.o. female is here for an colonoscopy.   Past Medical History:  Diagnosis Date  . Allergies   . GERD (gastroesophageal reflux disease)   . Hyperlipidemia   . Hypertension   . Lumbar back pain   . Pre-diabetes    during pregnancy     Past Surgical History:  Procedure Laterality Date  . ABDOMINAL HYSTERECTOMY  1998  . LUMBAR LAMINECTOMY/DECOMPRESSION MICRODISCECTOMY Left 10/16/2017   Procedure: Microlumbar decompression L4-5, L5-S1 left;  Surgeon: Susa Day, MD;  Location: [facility];  Service: Orthopedics;  Laterality: Left;  120 mins  . RHINOPLASTY  1988  . SHOULDER SURGERY Right 2011  . [place]    Prior to Admission medications   Medication Sig Start Date End Date Taking? Authorizing Provider  Biotin 1000 MCG CHEW Chew 1 each by mouth daily.   Yes [provider]  Cholecalciferol (VITAMIN D3 PO) Take 1,500 Units by mouth daily.   Yes [provider]  ezetimibe (ZETIA) 10 MG tablet Take 1 tablet (10 mg total) by mouth daily. 07/01/20 09/29/20 Yes Agbor-Etang, Aaron Edelman, MD  famotidine (PEPCID) 20 MG tablet Take 1 tablet (20 mg total) by mouth 2 (two) times daily. Patient taking differently: Take 20 mg by mouth daily. 09/06/20  Yes Mar Daring, PA-C  fexofenadine (ALLEGRA) 180 MG tablet Take 180 mg by mouth daily.   Yes [provider]  hydrochlorothiazide (HYDRODIURIL) 25 MG tablet TAKE 1 TABLET BY MOUTH EVERY DAY 10/26/20  Yes Burnette, Clearnce Sorrel, PA-C  losartan (COZAAR) 50 MG tablet Take 1 tablet (50 mg total) by mouth daily. 11/09/19  Yes Mar Daring, PA-C  Melatonin 10 MG TABS Take 1 tablet by mouth at bedtime.    Yes [provider]  omeprazole (PRILOSEC) 20 MG capsule Take 1 capsule (20 mg total) by mouth daily. 09/14/20  Yes Burnette, Clearnce Sorrel, PA-C  EPINEPHrine 0.3 mg/0.3 mL IJ SOAJ injection INJECT 1 PEN IN THIGH MUSCLE ONE TIME FOR ALLERGIC REACTION. MAY REPEAT IN 5 TO 15 MINUTES IF NEEDED 02/05/20   [provider]  promethazine-dextromethorphan (PROMETHAZINE-DM) 6.25-15 MG/5ML syrup Take 5 mLs by mouth 3 (three) times daily as needed. Patient not taking: Reported on 12/21/2020 10/13/20   Trinna Post, PA-C    Allergies as of 10/12/2020 - Review Complete 10/03/2020  Allergen Reaction Noted  . Fenofibrate Other (See Comments) 10/06/2020  . Latex  07/01/2020  . Other Itching 09/12/2015  . Pravastatin sodium  03/30/2015  . Shellfish allergy Swelling 05/06/2020    Family History  Adopted: Yes  Problem Relation Age of Onset  . Breast cancer Mother 44  . Alcohol abuse Mother   . Hyperlipidemia Sister   . Hyperlipidemia Brother   . Hyperlipidemia Daughter     Social History   Socioeconomic History  . Marital status: Married    Spouse name: Not on file  . Number of children: Not on file  . Years of education: Not on file  . Highest education level: Not on file  Occupational History  . Not on file  Tobacco Use  . Smoking status: Former Smoker    Types: Cigarettes  . Smokeless tobacco: Never Used  . Tobacco comment: 30 years quit  Vaping Use  . Vaping Use: Never used  Substance and Sexual Activity  . Alcohol use: Yes    Alcohol/week: 0.0 standard drinks    Comment: OCCASIONALLY  . Drug use: No  . Sexual activity: Not on file  Other Topics Concern  . Not on file  Social History Narrative  . Not on file   Social Determinants of Health   Financial Resource Strain: Not on file  Food Insecurity: Not on file  Transportation Needs: Not on file  Physical Activity: Not on file  Stress: Not on file  Social Connections: Not on file  Intimate Partner Violence: Not on file    Review  of Systems: See HPI, otherwise negative ROS  Physical Exam: BP (!) 147/76   Pulse 87   Temp 99 F (37.2 C) (Temporal)   Ht 5\' 5"  (1.651 m)   Wt 79.8 kg   SpO2 98%   BMI 29.29 kg/m  General:   Alert,  pleasant and cooperative in NAD Head:  Normocephalic and atraumatic. Neck:  Supple; no masses or thyromegaly. Lungs:  Clear throughout to auscultation.    Heart:  Regular rate and rhythm. Abdomen:  Soft, nontender and nondistended. Normal bowel sounds, without guarding, and without rebound.   Neurologic:  Alert and  oriented x4;  grossly normal neurologically.  Impression/Plan: Debra [name] is here for an colonoscopy to be performed for a history of adenomatous polyps on 08/2014   Risks, benefits, limitations, and alternatives regarding  colonoscopy have been reviewed with the patient.  Questions have been answered.  All parties agreeable.   Lucilla Lame, MD  12/30/2020, 9:56 AM

## 2020-12-30 NOTE — Anesthesia Postprocedure Evaluation (Signed)
Anesthesia Post Note  Patient: Debra [name]  Procedure(s) Performed: COLONOSCOPY WITH PROPOFOL (N/A ) POLYPECTOMY     Patient location during evaluation: PACU Anesthesia Type: General Level of consciousness: awake and alert Pain management: pain level controlled Vital Signs Assessment: post-procedure vital signs reviewed and stable Respiratory status: spontaneous breathing, nonlabored ventilation, respiratory function stable and patient connected to nasal cannula oxygen Cardiovascular status: blood pressure returned to baseline and stable Postop Assessment: no apparent nausea or vomiting Anesthetic complications: no   No complications documented.  Fidel Levy

## 2020-12-30 NOTE — Anesthesia Procedure Notes (Signed)
Date/Time: 12/30/2020 10:03 AM Performed by: Cameron Ali, CRNA Pre-anesthesia Checklist: Patient identified, Emergency Drugs available, Suction available, Timeout performed and Patient being monitored Patient Re-evaluated:Patient Re-evaluated prior to induction Oxygen Delivery Method: Nasal cannula Placement Confirmation: positive ETCO2

## 2020-12-30 NOTE — Transfer of Care (Signed)
Immediate Anesthesia Transfer of Care Note  Patient: Debra [name]  Procedure(s) Performed: COLONOSCOPY WITH PROPOFOL (N/A ) POLYPECTOMY  Patient Location: PACU  Anesthesia Type: General  Level of Consciousness: awake, alert  and patient cooperative  Airway and Oxygen Therapy: Patient Spontanous Breathing and Patient connected to supplemental oxygen  Post-op Assessment: Post-op Vital signs reviewed, Patient's Cardiovascular Status Stable, Respiratory Function Stable, Patent Airway and No signs of Nausea or vomiting  Post-op Vital Signs: Reviewed and stable  Complications: No complications documented.

## 2020-12-30 NOTE — Op Note (Signed)
[facility] Patient Name: Debra [name] Procedure Date: 12/30/2020 9:55 AM MRN: [id number] Account #: [id number] Date of Birth: [birth year] Admit Type: Outpatient Age: 65 Room: [facility] OR ROOM 01 Gender: Female Note Status: Finalized Procedure:             Colonoscopy Indications:           High risk colon cancer surveillance: Personal history                         of colonic polyps Providers:             Lucilla Lame MD, MD Referring MD:          Mar Daring (Referring MD) Medicines:             Propofol per Anesthesia Complications:         No immediate complications. Procedure:             Pre-Anesthesia Assessment:                        - Prior to the procedure, a History and Physical was                         performed, and patient medications and allergies were                         reviewed. The patient's tolerance of previous                         anesthesia was also reviewed. The risks and benefits                         of the procedure and the sedation options and risks                         were discussed with the patient. All questions were                         answered, and informed consent was obtained. Prior                         Anticoagulants: The patient has taken no previous                         anticoagulant or antiplatelet agents. ASA Grade                         Assessment: II - A patient with mild systemic disease.                         After reviewing the risks and benefits, the patient                         was deemed in satisfactory condition to undergo the                         procedure.  After obtaining informed consent, the colonoscope was                         passed under direct vision. Throughout the procedure,                         the patient's blood pressure, pulse, and oxygen                         saturations were monitored continuously. The was                          introduced through the anus and advanced to the the                         cecum, identified by appendiceal orifice and ileocecal                         valve. The colonoscopy was performed without                         difficulty. The patient tolerated the procedure well.                         The quality of the bowel preparation was excellent. Findings:      The perianal and digital rectal examinations were normal.      A 3 mm polyp was found in the ascending colon. The polyp was sessile.       The polyp was removed with a cold snare. Resection and retrieval were       complete.      A 3 mm polyp was found in the sigmoid colon. The polyp was sessile. The       polyp was removed with a cold snare. Resection and retrieval were       complete.      Non-bleeding internal hemorrhoids were found during retroflexion. The       hemorrhoids were Grade I (internal hemorrhoids that do not prolapse). Impression:            - One 3 mm polyp in the ascending colon, removed with                         a cold snare. Resected and retrieved.                        - One 3 mm polyp in the sigmoid colon, removed with a                         cold snare. Resected and retrieved.                        - Non-bleeding internal hemorrhoids. Recommendation:        - Discharge patient to home.                        - Resume previous diet.                        - Continue present medications.                        -  Await pathology results.                        - Repeat colonoscopy in 7 years for surveillance. Procedure Code(s):     --- Professional ---                        [phone], Colonoscopy, flexible; with removal of                         tumor(s), polyp(s), or other lesion(s) by snare                         technique Diagnosis Code(s):     --- Professional ---                        Z86.010, Personal history of colonic polyps                        K63.5, Polyp of  colon CPT copyright 2019 American Medical Association. All rights reserved. The codes documented in this report are preliminary and upon coder review may  be revised to meet current compliance requirements. Lucilla Lame MD, MD 12/30/2020 10:21:15 AM This report has been signed electronically. Number of Addenda: 0 Note Initiated On: 12/30/2020 9:55 AM Scope Withdrawal Time: 0 hours 7 minutes 17 seconds  Total Procedure Duration: 0 hours 11 minutes 11 seconds  Estimated Blood Loss:  Estimated blood loss: none. Estimated blood loss: none.      [facility]

## 2021-01-02 ENCOUNTER — Encounter: Payer: Self-pay | Admitting: Gastroenterology

## 2021-01-02 LAB — SURGICAL PATHOLOGY

## 2021-01-03 ENCOUNTER — Encounter: Payer: Self-pay | Admitting: Gastroenterology

## 2021-01-09 ENCOUNTER — Encounter: Payer: Self-pay | Admitting: Cardiology

## 2021-01-09 ENCOUNTER — Ambulatory Visit: Payer: No Typology Code available for payment source | Admitting: Cardiology

## 2021-01-09 ENCOUNTER — Other Ambulatory Visit: Payer: Self-pay

## 2021-01-09 VITALS — BP 130/70 | HR 77 | Ht 65.0 in | Wt 180.0 lb

## 2021-01-09 DIAGNOSIS — E7800 Pure hypercholesterolemia, unspecified: Secondary | ICD-10-CM | POA: Diagnosis not present

## 2021-01-09 DIAGNOSIS — I10 Essential (primary) hypertension: Secondary | ICD-10-CM

## 2021-01-09 MED ORDER — NEXLIZET 180-10 MG PO TABS: 1.0000 | ORAL_TABLET | Freq: Every day | ORAL | 3 refills | Status: DC

## 2021-01-09 NOTE — Progress Notes (Signed)
Cardiology Office Note:    Date:  01/09/2021   ID:  Debra [name], DOB 1955-11-15, MRN [id number]  PCP:  Mar Daring, PA-C  [facility] Cardiologist:  Kate Sable, MD  [facility] Electrophysiologist:  None   Referring MD: Florian Buff*   Chief Complaint  Patient presents with  . Other    3 month follow up - Meds reviewed verbally with patient.     History of Present Illness:    Debra [name] is a 65 y.o. female with a hx of hypertension, hyperlipidemia who presents for follow-up.   Patient being seen for hyperlipidemia and hypertension.  She is intolerant to statins, insurance did not approve PCSK9's.  Started on Zetia, but had suboptimal cholesterol levels.  Fenofibrate was not started due to patient having severe reaction/back pains with fibrates.  She now presents for follow-up, tolerating Zetia, has no new complaints or concerns at this time.  She states all of her brothers and mother have history of high cholesterol.  Prior notes Echocardiogram 06/8337 normal systolic and diastolic function, EF 60 to 65% Coronary CTA 06/2020 normal, no evidence of CAD.   Past Medical History:  Diagnosis Date  . Allergies   . GERD (gastroesophageal reflux disease)   . Hyperlipidemia   . Hypertension   . Lumbar back pain   . Pre-diabetes    during pregnancy     Past Surgical History:  Procedure Laterality Date  . ABDOMINAL HYSTERECTOMY  1998  . COLONOSCOPY WITH PROPOFOL N/A 12/30/2020   Procedure: COLONOSCOPY WITH PROPOFOL;  Surgeon: Lucilla Lame, MD;  Location: [facility];  Service: Endoscopy;  Laterality: N/A;  . LUMBAR LAMINECTOMY/DECOMPRESSION MICRODISCECTOMY Left 10/16/2017   Procedure: Microlumbar decompression L4-5, L5-S1 left;  Surgeon: Susa Day, MD;  Location: [facility];  Service: Orthopedics;  Laterality: Left;  120 mins  . POLYPECTOMY  12/30/2020   Procedure: POLYPECTOMY;  Surgeon: Lucilla Lame, MD;  Location: [facility];   Service: Endoscopy;;  . RHINOPLASTY  1988  . SHOULDER SURGERY Right 2011  . TEMPOROMANDIBULAR JOINT SURGERY  1990    Current Medications: Current Meds  Medication Sig  . Bempedoic Acid-Ezetimibe (NEXLIZET) 180-10 MG TABS Take 1 tablet by mouth daily.  . Biotin 1000 MCG CHEW Chew 1 each by mouth daily.  . Cholecalciferol (VITAMIN D3 PO) Take 1,500 Units by mouth daily.  Marland Kitchen EPINEPHrine 0.3 mg/0.3 mL IJ SOAJ injection INJECT 1 PEN IN THIGH MUSCLE ONE TIME FOR ALLERGIC REACTION. MAY REPEAT IN 5 TO 15 MINUTES IF NEEDED  . famotidine (PEPCID) 20 MG tablet Take 20 mg by mouth daily.  . fexofenadine (ALLEGRA) 180 MG tablet Take 180 mg by mouth daily.  . hydrochlorothiazide (HYDRODIURIL) 25 MG tablet TAKE 1 TABLET BY MOUTH EVERY DAY  . losartan (COZAAR) 50 MG tablet Take 1 tablet (50 mg total) by mouth daily.  . Melatonin 10 MG TABS Take 1 tablet by mouth at bedtime.   Marland Kitchen omeprazole (PRILOSEC) 20 MG capsule Take 1 capsule (20 mg total) by mouth daily.  . [DISCONTINUED] ezetimibe (ZETIA) 10 MG tablet Take 1 tablet (10 mg total) by mouth daily.     Allergies:   Fenofibrate, Latex, Other, Pravastatin sodium, and Shellfish allergy   Social History   Socioeconomic History  . Marital status: Married    Spouse name: Not on file  . Number of children: Not on file  . Years of education: Not on file  . Highest education level: Not on file  Occupational History  . Not on file  Tobacco Use  . Smoking status: Former Smoker    Types: Cigarettes  . Smokeless tobacco: Never Used  . Tobacco comment: 30 years quit   Vaping Use  . Vaping Use: Never used  Substance and Sexual Activity  . Alcohol use: Yes    Alcohol/week: 0.0 standard drinks    Comment: OCCASIONALLY  . Drug use: No  . Sexual activity: Not on file  Other Topics Concern  . Not on file  Social History Narrative  . Not on file   Social Determinants of Health   Financial Resource Strain: Not on file  Food Insecurity: Not on file   Transportation Needs: Not on file  Physical Activity: Not on file  Stress: Not on file  Social Connections: Not on file     Family History: The patient's family history includes Alcohol abuse in her mother; Breast cancer (age of onset: 64) in her mother; Hyperlipidemia in her brother, daughter, and sister. She was adopted.  ROS:   Please see the history of present illness.     All other systems reviewed and are negative.  EKGs/Labs/Other Studies Reviewed:    The following studies were reviewed today:   EKG:  EKG is ordered today.  EKG shows sinus rhythm  Recent Labs: 05/11/2020: ALT 37; BUN 11; Creatinine, Ser 0.69; Hemoglobin 14.5; Platelets 371; Potassium 4.4; Sodium 140; TSH 1.980  Recent Lipid Panel    Component Value Date/Time   CHOL 231 (H) 10/03/2020 0754   CHOL 245 (H) 05/11/2020 0827   TRIG 295 (H) 10/03/2020 0754   HDL 49 10/03/2020 0754   HDL 53 05/11/2020 0827   CHOLHDL 4.7 10/03/2020 0754   VLDL 59 (H) 10/03/2020 0754   LDLCALC 123 (H) 10/03/2020 0754   LDLCALC 156 (H) 05/11/2020 0827   LDLCALC 171 (H) 08/16/2017 0801    Physical Exam:    VS:  BP 130/70 (BP Location: Left Arm, Patient Position: Sitting, Cuff Size: Normal)   Pulse 77   Ht 5\' 5"  (1.651 m)   Wt 180 lb (81.6 kg)   SpO2 96%   BMI 29.95 kg/m     Wt Readings from Last 3 Encounters:  01/09/21 180 lb (81.6 kg)  12/30/20 176 lb (79.8 kg)  10/03/20 185 lb (83.9 kg)     GEN:  Well nourished, well developed in no acute distress HEENT: Normal NECK: No JVD; No carotid bruits LYMPHATICS: No lymphadenopathy CARDIAC: RRR, no murmurs, rubs, gallops RESPIRATORY:  Clear to auscultation without rales, wheezing or rhonchi  ABDOMEN: Soft, non-tender, non-distended MUSCULOSKELETAL:  No edema; No deformity  SKIN: Warm and dry NEUROLOGIC:  Alert and oriented x 3 PSYCHIATRIC:  Normal affect   ASSESSMENT:    1. Primary hypertension   2. Pure hypercholesterolemia    PLAN:    In order of problems  listed above:  1. hypertension, BP controlled.  Continue HCTZ and losartan. 2. Patient with history of hyperlipidemia, likely familial hypercholesterolemia as her brothers and mother also has elevated cholesterol levels.  Not tolerant to statins.  Insurance did not approve PCSK9.  Patient not tolerant to fibroids.  Start Nexlizet at 180-10 mg daily.  Repeat fasting lipid profile in 3 months  Follow-up in 3 months  Total encounter time 30 minutes  Greater than 50% was spent in counseling and coordination of care with the patient   This note was generated in part or whole with voice recognition software. Voice recognition is usually quite  accurate but there are transcription errors that can and very often do occur. I apologize for any typographical errors that were not detected and corrected.  Medication Adjustments/Labs and Tests Ordered: Current medicines are reviewed at length with the patient today.  Concerns regarding medicines are outlined above.  Orders Placed This Encounter  Procedures  . Lipid panel  . EKG 12-Lead   Meds ordered this encounter  Medications  . Bempedoic Acid-Ezetimibe (NEXLIZET) 180-10 MG TABS    Sig: Take 1 tablet by mouth daily.    Dispense:  30 tablet    Refill:  3    Patient Instructions  Medication Instructions:   Your physician has recommended you make the following change in your medication:   1.  STOP taking your Zetia.  2.  START taking Nexlizet 180-10 MG once daily.   *If you need a refill on your cardiac medications before your next appointment, please call your pharmacy*   Lab Work:  Your physician recommends that you return for a FASTING lipid profile: in 3 months just prior to your follow up appointment.  - You will need to be fasting. Please do not have anything to eat or drink after midnight the morning you have the lab work. You may only have water or black coffee with no cream or sugar. - Please go to the [facility]. You  will check in at the front desk to the right as you walk into the atrium. Valet Parking is offered if needed. - No appointment needed. You may go any day between 7 am and 6 pm.   Testing/Procedures: None ordered   Follow-Up: At [facility], you and your health needs are our priority.  As part of our continuing mission to provide you with exceptional heart care, we have created designated Provider Care Teams.  These Care Teams include your primary Cardiologist (physician) and Advanced Practice Providers (APPs -  Physician Assistants and Nurse Practitioners) who all work together to provide you with the care you need, when you need it.  We recommend signing up for the patient portal called "MyChart".  Sign up information is provided on this After Visit Summary.  MyChart is used to connect with patients for Virtual Visits (Telemedicine).  Patients are able to view lab/test results, encounter notes, upcoming appointments, etc.  Non-urgent messages can be sent to your provider as well.   To learn more about what you can do with MyChart, go to [email or web address].    Your next appointment:   3 month(s)  The format for your next appointment:   In Person  Provider:   Kate Sable, MD   Other Instructions      Signed, Kate Sable, MD  01/09/2021 4:56 PM    [place]

## 2021-01-09 NOTE — Patient Instructions (Signed)
Medication Instructions:   Your physician has recommended you make the following change in your medication:   1.  STOP taking your Zetia.  2.  START taking Nexlizet 180-10 MG once daily.   *If you need a refill on your cardiac medications before your next appointment, please call your pharmacy*   Lab Work:  Your physician recommends that you return for a FASTING lipid profile: in 3 months just prior to your follow up appointment.  - You will need to be fasting. Please do not have anything to eat or drink after midnight the morning you have the lab work. You may only have water or black coffee with no cream or sugar. - Please go to the [facility]. You will check in at the front desk to the right as you walk into the atrium. Valet Parking is offered if needed. - No appointment needed. You may go any day between 7 am and 6 pm.   Testing/Procedures: None ordered   Follow-Up: At [facility], you and your health needs are our priority.  As part of our continuing mission to provide you with exceptional heart care, we have created designated Provider Care Teams.  These Care Teams include your primary Cardiologist (physician) and Advanced Practice Providers (APPs -  Physician Assistants and Nurse Practitioners) who all work together to provide you with the care you need, when you need it.  We recommend signing up for the patient portal called "MyChart".  Sign up information is provided on this After Visit Summary.  MyChart is used to connect with patients for Virtual Visits (Telemedicine).  Patients are able to view lab/test results, encounter notes, upcoming appointments, etc.  Non-urgent messages can be sent to your provider as well.   To learn more about what you can do with MyChart, go to [email or web address].    Your next appointment:   3 month(s)  The format for your next appointment:   In Person  Provider:   Kate Sable, MD   Other Instructions

## 2021-01-10 ENCOUNTER — Telehealth: Payer: Self-pay | Admitting: *Deleted

## 2021-01-10 NOTE — Telephone Encounter (Signed)
Pt requiring PA for Nexlizet 180/10 mg tablet. PA has been submitted through covermymeds. Awaiting approval.

## 2021-01-10 NOTE — Telephone Encounter (Signed)
[employer] is reviewing your PA request. Typically an electronic response will be received within 24-72 hours. To check for an update later, open this request from your dashboard.    You may close this dialog and return to your dashboard to perform other tasks.

## 2021-01-11 NOTE — Telephone Encounter (Signed)
RECEIVED FAXED DOCUMENTATION PA FOR MEDICATION HAS BEEN DENIED DUE TO PATIENT NOT MEETING CRITERIA.  WOULD YOU LIE TO PROCEED WITH AN APPEAL?

## 2021-01-25 NOTE — Telephone Encounter (Signed)
Patient calling back in to check on status and she if there is anything else she needs to be doing

## 2021-01-27 MED ORDER — EZETIMIBE 10 MG PO TABS: 10.0000 mg | ORAL_TABLET | Freq: Every day | ORAL | 0 refills | Status: DC

## 2021-01-27 NOTE — Addendum Note (Signed)
Addended by: Kavin Leech on: 01/27/2021 08:20 AM   Modules accepted: Orders

## 2021-01-27 NOTE — Telephone Encounter (Signed)
Called and spoke with patient. I informed her that I am working on an appeal for her Nexlizet, and I will be in touch with her next week after I am able to gather and fax all the necessary information to her insurance company. I have sent in a 30 day refill for her Zetia in the mean time until we can hopefully get an approval.

## 2021-02-06 NOTE — Telephone Encounter (Signed)
Denial letter has been received. Nexlizet was denied because patient needs to have HeFH or ASCVD. Reviewed patient's PMH, she does have a history of chest pain. Angina is included in ASCVD definition and should qualify pt for Nexlizet coverage. Will submit appeals.

## 2021-02-10 NOTE — Telephone Encounter (Signed)
Called insurance for status update. Appeals has been received but is still in review process.

## 2021-02-13 ENCOUNTER — Other Ambulatory Visit: Payer: Self-pay

## 2021-02-13 DIAGNOSIS — I10 Essential (primary) hypertension: Secondary | ICD-10-CM

## 2021-02-13 MED ORDER — LOSARTAN POTASSIUM 50 MG PO TABS: 50.0000 mg | ORAL_TABLET | Freq: Every day | ORAL | 3 refills | Status: DC

## 2021-02-13 NOTE — Telephone Encounter (Signed)
*  STAT* If patient is at the pharmacy, call can be transferred to refill team.   1. Which medications need to be refilled? (please list name of each medication and dose if known) Losartan  2. Which pharmacy/location (including street and city if local pharmacy) is medication to be sent to? [place]  3. Do they need a 30 day or 90 day supply? [place]

## 2021-02-15 MED ORDER — NEXLIZET 180-10 MG PO TABS: 1.0000 | ORAL_TABLET | Freq: Every day | ORAL | 3 refills | Status: DC

## 2021-02-15 NOTE — Addendum Note (Signed)
Addended by: Dashana Guizar E on: 02/15/2021 11:46 AM   Modules accepted: Orders

## 2021-02-15 NOTE — Telephone Encounter (Addendum)
Nexlizet appeals overturned, med is now covered through 02/09/22. Pt is aware to stop ezetimibe and start Nexlizet 1 tablet daily. Labs can be rechecked at July cardiology visit. Pt prefers 3 month supply of med at a time, new rx sent in.

## 2021-02-15 NOTE — Telephone Encounter (Signed)
Called and copay card information given to the pharmacy

## 2021-02-16 ENCOUNTER — Other Ambulatory Visit: Payer: Self-pay

## 2021-02-16 ENCOUNTER — Ambulatory Visit: Payer: No Typology Code available for payment source | Admitting: Nurse Practitioner

## 2021-02-16 ENCOUNTER — Encounter: Payer: Self-pay | Admitting: Nurse Practitioner

## 2021-02-16 VITALS — BP 130/71 | HR 83 | Temp 98.0°F | Ht 65.5 in | Wt 180.0 lb

## 2021-02-16 DIAGNOSIS — E559 Vitamin D deficiency, unspecified: Secondary | ICD-10-CM

## 2021-02-16 DIAGNOSIS — E7800 Pure hypercholesterolemia, unspecified: Secondary | ICD-10-CM

## 2021-02-16 DIAGNOSIS — Z7689 Persons encountering health services in other specified circumstances: Secondary | ICD-10-CM

## 2021-02-16 DIAGNOSIS — E119 Type 2 diabetes mellitus without complications: Secondary | ICD-10-CM | POA: Diagnosis not present

## 2021-02-16 DIAGNOSIS — I10 Essential (primary) hypertension: Secondary | ICD-10-CM | POA: Diagnosis not present

## 2021-02-16 LAB — URINALYSIS, ROUTINE W REFLEX MICROSCOPIC
Bilirubin, UA: NEGATIVE
Glucose, UA: NEGATIVE
Ketones, UA: NEGATIVE
Nitrite, UA: NEGATIVE
Protein,UA: NEGATIVE
RBC, UA: NEGATIVE
Specific Gravity, UA: 1.01 (ref 1.005–1.030)
Urobilinogen, Ur: 0.2 mg/dL (ref 0.2–1.0)
pH, UA: 7 (ref 5.0–7.5)

## 2021-02-16 LAB — MICROSCOPIC EXAMINATION
Bacteria, UA: NONE SEEN
Epithelial Cells (non renal): NONE SEEN /hpf (ref 0–10)
RBC, Urine: NONE SEEN /hpf (ref 0–2)
WBC, UA: NONE SEEN /hpf (ref 0–5)

## 2021-02-16 MED ORDER — HYDROCHLOROTHIAZIDE 25 MG PO TABS: 1.0000 | ORAL_TABLET | Freq: Every day | ORAL | 1 refills | Status: DC

## 2021-02-16 NOTE — Progress Notes (Signed)
BP 130/71   Pulse 83   Temp 98 F (36.7 C) (Oral)   Ht 5' 5.5" (1.664 m)   Wt 180 lb (81.6 kg)   LMP  (LMP Unknown)   SpO2 98%   BMI 29.50 kg/m    Subjective:    Patient ID: Debra [name], female    DOB: May 21, 1956, 64 y.o.   MRN: [id number]  HPI: Debra [name] is a 65 y.o. female  Chief Complaint  Patient presents with  . Establish Care    No concerns per patient    Patient presents to clinic to establish care today.  She has a history of HTN/HLD, GERD, and diabetes.  She went to see a Cardiologist due to chest pain which was found to be reflux.  Since she has been on the Omeprazole she hasn't had any more of the chest pain.  However, she continues to follow up with Cardiology who manages her HTN and HLD.  She was started on Nexlizet for her cholesterol but the pharmacy didn't have it when she went to get it but she plans to start it soon.  Patient denies concerns at visit today. Patient denies Polyuria/polydipsia and does not check blood sugars at home.   HYPERTENSION / HYPERLIPIDEMIA Satisfied with current treatment? no Duration of hypertension: years BP monitoring frequency: occassionally BP range: not sure of numbers BP medication side effects: no Past BP meds: lisinopril-HCTZ Duration of hyperlipidemia: years Cholesterol medication side effects: no Cholesterol supplements: none Past cholesterol medications: Nexlizet Medication compliance: excellent compliance Aspirin: no Recent stressors: no Recurrent headaches: no Visual changes: no Palpitations: no Dyspnea: no Chest pain: no Lower extremity edema: no Dizzy/lightheaded: no  Relevant past medical, surgical, family and social history reviewed and updated as indicated. Interim medical history since our last visit reviewed. Allergies and medications reviewed and updated.  Review of Systems  Eyes: Negative for visual disturbance.  Respiratory: Negative for cough, chest tightness and shortness of breath.    Cardiovascular: Negative for chest pain, palpitations and leg swelling.  Endocrine: Negative for polydipsia and polyuria.  Neurological: Negative for dizziness and headaches.    Per HPI unless specifically indicated above     Objective:    BP 130/71   Pulse 83   Temp 98 F (36.7 C) (Oral)   Ht 5' 5.5" (1.664 m)   Wt 180 lb (81.6 kg)   LMP  (LMP Unknown)   SpO2 98%   BMI 29.50 kg/m   Wt Readings from Last 3 Encounters:  02/16/21 180 lb (81.6 kg)  01/09/21 180 lb (81.6 kg)  12/30/20 176 lb (79.8 kg)    Physical Exam Vitals and nursing note reviewed.  Constitutional:      General: She is not in acute distress.    Appearance: Normal appearance. She is normal weight. She is not ill-appearing, toxic-appearing or diaphoretic.  HENT:     Head: Normocephalic.     Right Ear: External ear normal.     Left Ear: External ear normal.     Nose: Nose normal.     Mouth/Throat:     Mouth: Mucous membranes are moist.     Pharynx: Oropharynx is clear.  Eyes:     General:        Right eye: No discharge.        Left eye: No discharge.     Extraocular Movements: Extraocular movements intact.     Conjunctiva/sclera: Conjunctivae normal.     Pupils: Pupils are equal, round,  and reactive to light.  Cardiovascular:     Rate and Rhythm: Normal rate and regular rhythm.     Heart sounds: No murmur heard.   Pulmonary:     Effort: Pulmonary effort is normal. No respiratory distress.     Breath sounds: Normal breath sounds. No wheezing or rales.  Musculoskeletal:     Cervical back: Normal range of motion and neck supple.  Skin:    General: Skin is warm and dry.     Capillary Refill: Capillary refill takes less than 2 seconds.  Neurological:     General: No focal deficit present.     Mental Status: She is alert and oriented to person, place, and time. Mental status is at baseline.  Psychiatric:        Mood and Affect: Mood normal.        Behavior: Behavior normal.        Thought  Content: Thought content normal.        Judgment: Judgment normal.        Assessment & Plan:   Problem List Items Addressed This Visit      Cardiovascular and Mediastinum   Essential hypertension    Chronic. Controlled.  Managed by Cardiology, continue per their recommendations.  Labs ordered today.  Follow up in 6 months.      Relevant Medications   hydrochlorothiazide (HYDRODIURIL) 25 MG tablet   Other Relevant Orders   Basic metabolic panel   Urinalysis, Routine w reflex microscopic     Endocrine   Diabetes type 2, controlled ([place]) - Primary    Chronic.  Reviewed prediabetes and diabetes with patient.  Reviewed previously lab work during visit today.  Patient not currently on medication or checking blood sugars.  Discussed lifestyle modifications.  Labs ordered today.  Will make recommendations based on lab results.  Follow up in 6 months.      Relevant Orders   Basic metabolic panel   Urinalysis, Routine w reflex microscopic   HgB A1c     Other   Hypercholesteremia    Chronic.  Managed by Cardiology, Continue per their recommendations.  Labs ordered today.  Follow up in 6 months.      Relevant Medications   hydrochlorothiazide (HYDRODIURIL) 25 MG tablet   Other Relevant Orders   Lipid panel    Other Visit Diagnoses    Vitamin D deficiency       Labs orderd today.  Continue with OTC supplement.  Will make further recommendations once labs have returned.   Relevant Orders   Vitamin D (25 hydroxy)   Encounter to establish care           Follow up plan: Return in about 6 months (around 08/19/2021) for Physical and Fasting labs.   A total of 40 minutes were spent on this encounter today.  When total time is documented, this includes both the face-to-face and non-face-to-face time personally spent before, during and after the visit on the date of the encounter.

## 2021-02-16 NOTE — Assessment & Plan Note (Signed)
Chronic.  Reviewed prediabetes and diabetes with patient.  Reviewed previously lab work during visit today.  Patient not currently on medication or checking blood sugars.  Discussed lifestyle modifications.  Labs ordered today.  Will make recommendations based on lab results.  Follow up in 6 months.

## 2021-02-16 NOTE — Assessment & Plan Note (Signed)
Chronic.  Managed by Cardiology, Continue per their recommendations.  Labs ordered today.  Follow up in 6 months.

## 2021-02-16 NOTE — Addendum Note (Signed)
Addended by: Jon Billings on: 02/16/2021 02:37 PM   Modules accepted: Orders

## 2021-02-16 NOTE — Assessment & Plan Note (Signed)
Chronic. Controlled.  Managed by Cardiology, continue per their recommendations.  Labs ordered today.  Follow up in 6 months.

## 2021-02-17 ENCOUNTER — Telehealth: Payer: Self-pay

## 2021-02-17 LAB — BASIC METABOLIC PANEL
BUN/Creatinine Ratio: 25 (ref 12–28)
BUN: 14 mg/dL (ref 8–27)
CO2: 20 mmol/L (ref 20–29)
Calcium: 9.8 mg/dL (ref 8.7–10.3)
Chloride: 97 mmol/L (ref 96–106)
Creatinine, Ser: 0.57 mg/dL (ref 0.57–1.00)
Glucose: 152 mg/dL — ABNORMAL HIGH (ref 65–99)
Potassium: 3.9 mmol/L (ref 3.5–5.2)
Sodium: 139 mmol/L (ref 134–144)
eGFR: 103 mL/min/{1.73_m2} (ref >59)

## 2021-02-17 LAB — LIPID PANEL
Chol/HDL Ratio: 5 ratio — ABNORMAL HIGH (ref 0.0–4.4)
Cholesterol, Total: 228 mg/dL — ABNORMAL HIGH (ref 100–199)
HDL: 46 mg/dL (ref >39)
LDL Chol Calc (NIH): 119 mg/dL — ABNORMAL HIGH (ref 0–99)
Triglycerides: 358 mg/dL — ABNORMAL HIGH (ref 0–149)
VLDL Cholesterol Cal: 63 mg/dL — ABNORMAL HIGH (ref 5–40)

## 2021-02-17 LAB — HEMOGLOBIN A1C
Est. average glucose Bld gHb Est-mCnc: 160 mg/dL
Hgb A1c MFr Bld: 7.2 % — ABNORMAL HIGH (ref 4.8–5.6)

## 2021-02-17 LAB — VITAMIN D 25 HYDROXY (VIT D DEFICIENCY, FRACTURES): Vit D, 25-Hydroxy: 44.9 ng/mL (ref 30.0–100.0)

## 2021-02-17 NOTE — Telephone Encounter (Signed)
Copied from [place] [phone]. Topic: General - Other >> Feb 17, 2021  1:24 PM Leward Quan A wrote: Reason for CRM: Patient called in in reference to a message on her My Chart regarding wanting to start her on Metformin. Patient would like a call to discuss for a better understanding why. Can be reached at Ph# [phone]

## 2021-02-17 NOTE — Telephone Encounter (Signed)
Copied from [place] [phone]. Topic: General - Other >> Feb 17, 2021 12:55 PM Celene Kras wrote: Reason for CRM: Pt called in regards to a mychart message that she received from PCP regarding the medication Crestor. She states that she is currently taking a cholesterol medication,  Nexlizet, through her cardiologist. Please advise.

## 2021-02-17 NOTE — Progress Notes (Signed)
Please let patient know that her urine test, liver, kidneys and electrolytes are all normal.  Her vitamin D is back within normal range.  Continue with the over the counter supplement.  Her cholesterol was elevated.  I calculated her ASCVD risk score which is below and it put her in the high risk category.  I recommend she start a statin medication to help treat her elevated cholesterol and help prevent a heart attack or stroke in the future. I know she wasn't able to tolerate pravastatin in the past. Some people tolerate Crestor better. We would start with Crestor 5mg .  As long as patient tolerates the medication we will increase it to a goal of 20mg .  Side effects include muscle aches and fatigue.  If she agrees I can send this to the pharmacy.  Also,  I recommend lifestyle changes. Your LDL is above normal. The LDL is the bad cholesterol. Over time and in combination with inflammation and other factors, this contributes to plaque which in turn may lead to stroke and/or heart attack down the road. Sometimes high LDL is primarily genetic, and people might be eating all the right foods but still have high numbers. Other times, there is room for improvement in one's diet and eating healthier can bring this number down and potentially reduce one's risk of heart attack and/or stroke.   To reduce your LDL, Remember - more fruits and vegetables, more fish, and limit red meat and dairy products. More soy, nuts, beans, barley, lentils, oats and plant sterol ester enriched margarine instead of butter. I also encourage eliminating sugar and processed food. Remember, shop on the outside of the grocery store and visit your [employer]. We should recheck your cholesterol in 3-6 months.  The 10-year ASCVD risk score Mikey Bussing DC Brooke Bonito., et al., 2013) is: 12.7%   Values used to calculate the score:     Age: 65 years     Sex: Female     Is Non-Hispanic African American: No     Diabetic: Yes     Tobacco smoker:  No     Systolic Blood Pressure: 193 mmHg     Is BP treated: Yes     HDL Cholesterol: 46 mg/dL     Total Cholesterol: 228 mg/dL  Also, patient's A1c (the measure for diabetes) increased again to 7.2.  With that said, I recommend we start medication.  The medication would be Metformin 500mg  twice daily with meals.  Side effects can include GI upset. Following a low carb diet can also be helpful to help control the diabetes.   If patient has questions, or would like to discuss any of these changes we can make a virtual or in person visit to discuss the changes.

## 2021-02-17 NOTE — Telephone Encounter (Signed)
Mychart message sent to patient.

## 2021-02-17 NOTE — Telephone Encounter (Signed)
See my chart message

## 2021-02-20 LAB — HM DIABETES EYE EXAM

## 2021-02-20 MED ORDER — METFORMIN HCL ER (OSM) 500 MG PO TB24: 1000.0000 mg | ORAL_TABLET | Freq: Every day | ORAL | 1 refills | Status: DC

## 2021-02-20 MED ORDER — METFORMIN HCL 500 MG PO TABS: 500.0000 mg | ORAL_TABLET | Freq: Two times a day (BID) | ORAL | 1 refills | Status: DC

## 2021-02-20 NOTE — Telephone Encounter (Signed)
Medication sent to patient's pharmacy.

## 2021-02-20 NOTE — Addendum Note (Signed)
Addended by: Jon Billings on: 02/20/2021 02:36 PM   Modules accepted: Orders

## 2021-02-20 NOTE — Addendum Note (Signed)
Addended by: Jon Billings on: 02/20/2021 08:12 AM   Modules accepted: Orders

## 2021-02-22 ENCOUNTER — Telehealth: Payer: Self-pay | Admitting: Cardiology

## 2021-02-22 NOTE — Telephone Encounter (Signed)
Pt c/o medication issue:  1. Name of Medication: nexlizet  2. How are you currently taking this medication (dosage and times per day)?  180-10 mg tabs  3. Are you having a reaction (difficulty breathing--STAT)?   4. What is your medication issue? Feeling spaced out, not able to concentrate and having joint aches

## 2021-02-23 MED ORDER — EZETIMIBE 10 MG PO TABS: 10.0000 mg | ORAL_TABLET | Freq: Every day | ORAL | 5 refills | Status: DC

## 2021-02-23 MED ORDER — ICOSAPENT ETHYL 1 G PO CAPS: 2.0000 g | ORAL_CAPSULE | Freq: Two times a day (BID) | ORAL | 5 refills | Status: DC

## 2021-02-23 NOTE — Telephone Encounter (Signed)
Called patient and left her a detailed VM per DPR on file with the following recommendation from Dr. Garen Lah.  Have patient stop Nexlizet due to side effects. Restart Zetia 10 mg daily. Please prescribe Vascepa 2 g twice daily if insurance approves in addition to Zetia. Thank you    Sent new prescriptions to patients pharmacy. Encouraged patient to call back with any questions or concerns.

## 2021-02-24 NOTE — Telephone Encounter (Signed)
Pt requiring PA for Vascepa 1 gm capsule. PA has been submitted via Covermymeds. Awaiting approval.  [employer] is reviewing your PA request. Typically an electronic response will be received within 24-72 hours. To check for an update later, open this request from your dashboard.  You may close this dialog and return to your dashboard to perform other tasks.  Key: [id number]  PA Case ID: [id number]

## 2021-02-27 NOTE — Telephone Encounter (Signed)
Pt has been denied for Vascepa 1 GM. Denial letter placed in nurses bin for review.

## 2021-02-27 NOTE — Telephone Encounter (Signed)
Received from [place] regarding Vascepa PA. Clinical questions answered and faxed back-waiting for determination.

## 2021-03-01 ENCOUNTER — Ambulatory Visit: Payer: Self-pay | Admitting: *Deleted

## 2021-03-01 NOTE — Telephone Encounter (Signed)
Scheduled tomorrow

## 2021-03-01 NOTE — Telephone Encounter (Signed)
  Pt called in c/o coughing and chest pain with coughing and deep breaths.   "Everyone in my family is sick with a viral infection coughing, head congestion".   We've all done Covid tests myself included and have negative results."  She requested the video appt be tomorrow because she left her phone at home today and doesn't have access to a computer. I made her a MyChart video visit appt with Jon Billings, NP for 03/02/2021 at 4:20.   Reason for Disposition . [1] Chest pain(s) lasting a few seconds from coughing AND [2] persists > 3 days  Answer Assessment - Initial Assessment Questions 1. LOCATION: "Where does it hurt?"       I'm having chest pain from a viral infection.  My whole family has it.   Covid test is negative.   I'm coughing a lot.   I'm taking Coricidin started yesterday. I'm not really coughing up anything.   The center of my chest hurts when I cough.    No pain when I'm not coughing.   Taking a deep breath hurts.   Denies shortness of breath. 2. RADIATION: "Does the pain go anywhere else?" (e.g., into neck, jaw, arms, back)     No radiation.   3. ONSET: "When did the chest pain begin?" (Minutes, hours or days)      This morning  Sat morning I started with the head congestion. 4. PATTERN "Does the pain come and go, or has it been constant since it started?"  "Does it get worse with exertion?"      Hurts with coughing and a deep 5. DURATION: "How long does it last" (e.g., seconds, minutes, hours)     N/A 6. SEVERITY: "How bad is the pain?"  (e.g., Scale 1-10; mild, moderate, or severe)    - MILD (1-3): doesn't interfere with normal activities     - MODERATE (4-7): interferes with normal activities or awakens from sleep    - SEVERE (8-10): excruciating pain, unable to do any normal activities       5 on pain scale with coughing or deep breath 7. CARDIAC RISK FACTORS: "Do you have any history of heart problems or risk factors for heart disease?" (e.g., angina, prior heart  attack; diabetes, high blood pressure, high cholesterol, smoker, or strong family history of heart disease)     No 8. PULMONARY RISK FACTORS: "Do you have any history of lung disease?"  (e.g., blood clots in lung, asthma, emphysema, birth control pills)     No 9. CAUSE: "What do you think is causing the chest pain?"     I have a viral infection.   My whole family is sick 4. OTHER SYMPTOMS: "Do you have any other symptoms?" (e.g., dizziness, nausea, vomiting, sweating, fever, difficulty breathing, cough)       Breaking out in a sweat, no chills. 11. PREGNANCY: "Is there any chance you are pregnant?" "When was your last menstrual period?"       N/A due to age  Protocols used: CHEST PAIN-A-AH

## 2021-03-02 ENCOUNTER — Other Ambulatory Visit: Payer: Self-pay

## 2021-03-02 ENCOUNTER — Telehealth: Payer: Self-pay | Admitting: *Deleted

## 2021-03-02 ENCOUNTER — Ambulatory Visit
Admission: RE | Admit: 2021-03-02 | Discharge: 2021-03-02 | Disposition: A | Discharge status: Home / Self Care | Payer: No Typology Code available for payment source | Source: Ambulatory Visit | Attending: Nurse Practitioner | Admitting: Nurse Practitioner

## 2021-03-02 ENCOUNTER — Ambulatory Visit
Admission: RE | Admit: 2021-03-02 | Discharge: 2021-03-02 | Disposition: A | Discharge status: Home / Self Care | Payer: No Typology Code available for payment source | Attending: Nurse Practitioner | Admitting: Nurse Practitioner

## 2021-03-02 ENCOUNTER — Telehealth: Payer: No Typology Code available for payment source | Admitting: Nurse Practitioner

## 2021-03-02 ENCOUNTER — Encounter: Payer: Self-pay | Admitting: Nurse Practitioner

## 2021-03-02 VITALS — BP 155/72 | HR 77

## 2021-03-02 DIAGNOSIS — J069 Acute upper respiratory infection, unspecified: Secondary | ICD-10-CM | POA: Diagnosis not present

## 2021-03-02 DIAGNOSIS — R0602 Shortness of breath: Secondary | ICD-10-CM

## 2021-03-02 MED ORDER — BENZONATATE 200 MG PO CAPS: 200.0000 mg | ORAL_CAPSULE | Freq: Two times a day (BID) | ORAL | 0 refills | Status: DC | PRN

## 2021-03-02 NOTE — Progress Notes (Signed)
LMP  (LMP Unknown)    Subjective:    Patient ID: Debra [name], female    DOB: July 03, 1956, 65 y.o.   MRN: [id number]  HPI: Debra [name] is a 65 y.o. female  Chief Complaint  Patient presents with  . Cough    Since Saturday   . head congestion  . chest congestion  . Shortness of Breath  . chest burns   UPPER RESPIRATORY TRACT INFECTION Worst symptom: Cough Fever: no Cough: yes Shortness of breath: yes Wheezing: yes Chest pain: yes, with cough Chest tightness: yes Chest congestion: yes Nasal congestion: yes Runny nose: no Post nasal drip: no Sneezing: no Sore throat: no Swollen glands: no Sinus pressure: no Headache: no Face pain: no Toothache: no Ear pain: no bilateral Ear pressure: yes left Eyes red/itching:no Eye drainage/crusting: no  Vomiting: no Rash: no Fatigue: yes Sick contacts: yes Strep contacts: no  Context: stable Recurrent sinusitis: no Relief with OTC cold/cough medications: no  Treatments attempted: coricidin   Relevant past medical, surgical, family and social history reviewed and updated as indicated. Interim medical history since our last visit reviewed. Allergies and medications reviewed and updated.  Review of Systems  Constitutional: Positive for fatigue. Negative for fever.  HENT: Positive for congestion. Negative for dental problem, ear pain, postnasal drip, rhinorrhea, sinus pressure, sinus pain, sneezing and sore throat.        Ear pressure  Respiratory: Positive for cough, chest tightness, shortness of breath and wheezing.        Chest congestion  Cardiovascular: Negative for chest pain.       Chest pain with cough  Gastrointestinal: Negative for vomiting.  Skin: Negative for rash.  Neurological: Negative for headaches.    Per HPI unless specifically indicated above     Objective:    LMP  (LMP Unknown)   Wt Readings from Last 3 Encounters:  02/16/21 180 lb (81.6 kg)  01/09/21 180 lb (81.6 kg)  12/30/20 176 lb  (79.8 kg)    Physical Exam Vitals and nursing note reviewed.  Pulmonary:     Effort: Pulmonary effort is normal. No respiratory distress.  Neurological:     Mental Status: She is alert.  Psychiatric:        Mood and Affect: Mood normal.        Behavior: Behavior normal.        Thought Content: Thought content normal.        Judgment: Judgment normal.     Results for orders placed or performed in visit on 02/16/21  Microscopic Examination   Urine  Result Value Ref Range   WBC, UA None seen 0 - 5 /hpf   RBC None seen 0 - 2 /hpf   Epithelial Cells (non renal) None seen 0 - 10 /hpf   Bacteria, UA None seen None seen/Few  Lipid panel  Result Value Ref Range   Cholesterol, Total 228 (H) 100 - 199 mg/dL   Triglycerides 358 (H) 0 - 149 mg/dL   HDL 46 >39 mg/dL   VLDL Cholesterol Cal 63 (H) 5 - 40 mg/dL   LDL Chol Calc (NIH) 119 (H) 0 - 99 mg/dL   Chol/HDL Ratio 5.0 (H) 0.0 - 4.4 ratio  Basic metabolic panel  Result Value Ref Range   Glucose 152 (H) 65 - 99 mg/dL   BUN 14 8 - 27 mg/dL   Creatinine, Ser 0.57 0.57 - 1.00 mg/dL   eGFR 103 >59 mL/min/1.73   BUN/Creatinine Ratio  25 12 - 28   Sodium 139 134 - 144 mmol/L   Potassium 3.9 3.5 - 5.2 mmol/L   Chloride 97 96 - 106 mmol/L   CO2 20 20 - 29 mmol/L   Calcium 9.8 8.7 - 10.3 mg/dL  Vitamin D (25 hydroxy)  Result Value Ref Range   Vit D, 25-Hydroxy 44.9 30.0 - 100.0 ng/mL  HgB A1c  Result Value Ref Range   Hgb A1c MFr Bld 7.2 (H) 4.8 - 5.6 %   Est. average glucose Bld gHb Est-mCnc 160 mg/dL  Urinalysis, Routine w reflex microscopic  Result Value Ref Range   Specific Gravity, UA 1.010 1.005 - 1.030   pH, UA 7.0 5.0 - 7.5   Color, UA Yellow Yellow   Appearance Ur Clear Clear   Leukocytes,UA Trace (A) Negative   Protein,UA Negative Negative/Trace   Glucose, UA Negative Negative   Ketones, UA Negative Negative   RBC, UA Negative Negative   Bilirubin, UA Negative Negative   Urobilinogen, Ur 0.2 0.2 - 1.0 mg/dL    Nitrite, UA Negative Negative   Microscopic Examination See below:       Assessment & Plan:   Problem List Items Addressed This Visit   None      Follow up plan: No follow-ups on file.    This visit was completed via MyChart due to the restrictions of the COVID-19 pandemic. All issues as above were discussed and addressed. Physical exam was done as above through visual confirmation on MyChart. If it was felt that the patient should be evaluated in the office, they were directed there. The patient verbally consented to this visit. 1. Location of the patient: Home 2. Location of the provider: Office 3. Those involved with this call:  ? Provider: Jon Billings, NP ? CMA: Tammy Daveluy ? Front Desk/Registration: [facility] 4. Time spent on call: 15 minutes with patient face to face via video conference. More than 50% of this time was spent in counseling and coordination of care. 20 minutes total spent in review of patient's record and preparation of their chart.

## 2021-03-02 NOTE — Telephone Encounter (Signed)
Insurance denied coverage for Vascepa.  Plan member ID: [id number] Case number: [id number] Prescriber name: Kate Sable Prescriber fax: [phone]  [place]  Dear Roxana Hires,  On behalf of [place], [employer] is responsible for reviewing pharmacy services provided to [place] members. On 02/24/2021, we received a request from your prescriber for coverage of Icosapent Cap 1gm. We reviewed all of the information you and/or your doctor sent to [place] and sent the information to an appropriate physician specialist if needed. Unfortunately, we must deny coverage for Icosapent Ethyl. Why was my request denied? This request was denied because you did not meet the following clinical requirements: The requested medication is not covered because it is not on the listing or formulary of approved drugs for your plan benefit. Please discuss alternative drug therapy with your doctor. The request for coverage for icosapent 1g capsule, use as directed (120 per month), is denied. This decision is based on health plan criteria for icosapent. This medicine is covered only if: All of the following: (1) You currently have or are considered high or very high risk for cardiovascular disease as evidenced by one of the following: (A) Established cardiovascular disease confirmed by one of the following: Acute coronary syndrome, history of myocardial infarction, stable or unstable angina, coronary or other arterial revascularization, stroke, transient ischemic attack, or peripheral arterial disease. (B) You have a diagnosis of type 2 diabetes. (2) Your doctor submits medical records (for example, chart notes, laboratory values) documenting one of the following: (A) You have been receiving at least 12 consecutive weeks of high-intensity statin therapy (for example, atorvastatin 40-80mg , rosuvastatin 20-40mg ) and will continue to receive a  high-intensity statin at maximally tolerated dose. (B) Both of the following: (I) You are unable to tolerate high-intensity statins as evidenced by one of the following intolerable and persistent (for example, more than two weeks) symptoms: (a) Myalgia (muscle symptoms without CK elevations). (b) Myositis (muscle symptoms with CK elevations less than ten times upper limit of normal). (II) One of the following: (a) You have been receiving at least 12 consecutive weeks of moderate-intensity (for example, atorvastatin 10-20mg , rosuvastatin 5-10mg , simvastatin greater than or equal to 20mg , pravastatin [id number] All [employer] trademarks and logos are owned by [employer] other brand or product names are trademarks or registered marks of their respective owners.  2016 [employer] rights reserved. [phone] Page 2 of 4 greater than or equal to 40mg , lovastatin 40mg , fluvastatin XL 80mg , fluvastatin 40mg  twice daily or Livalo greater than or equal to 2mg ) statin therapy and will continue to receive a moderate-intensity statin at a maximally tolerated dose. (b) You have been receiving at least 12 consecutive weeks of low-intensity (for example, simvastatin 10mg , pravastatin 10-20mg , lovastatin 20mg , fluvastatin 20-40mg , or Livalo 1mg ) statin therapy and will continue to receive a low-intensity statin at a maximally tolerated dose. The information provided does not show that you meet the criteria listed above. Reviewed by: Rennis Harding, RPh This denial is based on our Icosapent Ethyl drug coverage policy, in addition to any supplementary information you or your prescriber may have submitted.

## 2021-03-03 DIAGNOSIS — J069 Acute upper respiratory infection, unspecified: Secondary | ICD-10-CM | POA: Insufficient documentation

## 2021-03-03 NOTE — Assessment & Plan Note (Signed)
Recommend obtaining chest xray.  Patient offered tussinex and steroid dose pack which she declined. Discussed that this is likely a virus and does not warrant antibiotics. If chest xray shows infection will then treat with antibiotics. Otherwise, continue with OTC coricidin and tylenol.  Sent tessalon for cough.  Follow up if symptoms worsen or fail to improve.

## 2021-03-07 NOTE — Progress Notes (Signed)
Hi Debra [name].  Your chest xray was normal.  I hope you are feeling better.

## 2021-03-23 ENCOUNTER — Ambulatory Visit: Payer: No Typology Code available for payment source | Admitting: Nurse Practitioner

## 2021-03-23 ENCOUNTER — Other Ambulatory Visit: Payer: Self-pay

## 2021-03-23 ENCOUNTER — Encounter: Payer: Self-pay | Admitting: Nurse Practitioner

## 2021-03-23 DIAGNOSIS — J0100 Acute maxillary sinusitis, unspecified: Secondary | ICD-10-CM | POA: Diagnosis not present

## 2021-03-23 MED ORDER — FLUCONAZOLE 150 MG PO TABS: 150.0000 mg | ORAL_TABLET | Freq: Once | ORAL | 0 refills | Status: AC

## 2021-03-23 MED ORDER — DOXYCYCLINE HYCLATE 100 MG PO TABS: 100.0000 mg | ORAL_TABLET | Freq: Two times a day (BID) | ORAL | 0 refills | Status: DC

## 2021-03-23 NOTE — Progress Notes (Signed)
Acute Office Visit  Subjective:    Patient ID: Debra [name], female    DOB: Sep 30, 1956, 65 y.o.   MRN: [id number]  Chief Complaint  Patient presents with   URI    Pt states she has been having congestion, mouth pain, green mucus, cough, headache, fever, chills, and body aches. States symptoms started about a week    HPI Patient is in today for congestion, sinus pain, and gum pain for a few days, has been sick with cough, congestion, headache, and body aches for 2 weeks. Had several negative covid-19 tests.  UPPER RESPIRATORY TRACT INFECTION  Worst symptom: sinus pain Fever: yes, subjective on Sunday Cough: yes Shortness of breath: intermittent Wheezing: no Chest pain: no Chest tightness: no Chest congestion: no Nasal congestion: yes Runny nose: yes Post nasal drip: yes Sneezing: yes Sore throat: no Swollen glands: no Sinus pressure: yes Headache: yes Face pain: yes Toothache: yes Ear pain: no  Ear pressure: no  Eyes red/itching:no Eye drainage/crusting: no  Vomiting: no Rash: no Fatigue: yes Sick contacts: yes, family sick before her. Family has been negative as well Strep contacts: no  Context: worse Recurrent sinusitis: no Relief with OTC cold/cough medications: no  Treatments attempted: saline, nasal sprays   Past Medical History:  Diagnosis Date   Allergies    Borderline diabetes 03/30/2015   GERD (gastroesophageal reflux disease)    Hyperlipidemia    Hypertension    Lumbar back pain    Pre-diabetes    during pregnancy     Past Surgical History:  Procedure Laterality Date   ABDOMINAL HYSTERECTOMY  1998   CARPAL TUNNEL RELEASE Right    COLONOSCOPY WITH PROPOFOL N/A 12/30/2020   Procedure: COLONOSCOPY WITH PROPOFOL;  Surgeon: Lucilla Lame, MD;  Location: [facility];  Service: Endoscopy;  Laterality: N/A;   LUMBAR LAMINECTOMY/DECOMPRESSION MICRODISCECTOMY Left 10/16/2017   Procedure: Microlumbar decompression L4-5, L5-S1 left;  Surgeon:  Susa Day, MD;  Location: [facility];  Service: Orthopedics;  Laterality: Left;  120 mins   PLANTAR FASCIA RELEASE Left    POLYPECTOMY  12/30/2020   Procedure: POLYPECTOMY;  Surgeon: Lucilla Lame, MD;  Location: [place] [facility];  Service: Endoscopy;;   [place] Right 2011   [place]    Family History  Adopted: Yes  Problem Relation Age of Onset   Breast cancer Mother 98   Alcohol abuse Mother    Hyperlipidemia Sister    Hyperlipidemia Brother    Hyperlipidemia Brother    Obesity Son    Hypertension Son     Social History   Socioeconomic History   Marital status: Married    Spouse name: Not on file   Number of children: Not on file   Years of education: Not on file   Highest education level: Not on file  Occupational History   Not on file  Tobacco Use   Smoking status: Former    Pack years: 0.00    Types: Cigarettes   Smokeless tobacco: Never   Tobacco comments:    33 years quit   Vaping Use   Vaping Use: Never used  Substance and Sexual Activity   Alcohol use: Yes    Alcohol/week: 0.0 standard drinks    Comment: OCCASIONALLY   Drug use: No   Sexual activity: Not Currently  Other Topics Concern   Not on file  Social History Narrative   Not on file   Social Determinants of Health  Financial Resource Strain: Not on file  Food Insecurity: Not on file  Transportation Needs: Not on file  Physical Activity: Not on file  Stress: Not on file  Social Connections: Not on file  Intimate Partner Violence: Not on file    Outpatient Medications Prior to Visit  Medication Sig Dispense Refill   benzonatate (TESSALON) 200 MG capsule Take 1 capsule (200 mg total) by mouth 2 (two) times daily as needed for cough. 30 capsule 0   Biotin 1000 MCG CHEW Chew 1 each by mouth daily.     Cholecalciferol (VITAMIN D3 PO) Take 1,500 Units by mouth daily.     EPINEPHrine 0.3 mg/0.3 mL IJ SOAJ injection INJECT 1 PEN IN  THIGH MUSCLE ONE TIME FOR ALLERGIC REACTION. MAY REPEAT IN 5 TO 15 MINUTES IF NEEDED     ezetimibe (ZETIA) 10 MG tablet Take 1 tablet (10 mg total) by mouth daily. 30 tablet 5   fexofenadine (ALLEGRA) 180 MG tablet Take 180 mg by mouth daily.     hydrochlorothiazide (HYDRODIURIL) 25 MG tablet Take 1 tablet (25 mg total) by mouth daily. 90 tablet 1   icosapent Ethyl (VASCEPA) 1 g capsule Take 2 capsules (2 g total) by mouth 2 (two) times daily. 120 capsule 5   losartan (COZAAR) 50 MG tablet Take 1 tablet (50 mg total) by mouth daily. 90 tablet 3   Melatonin 10 MG TABS Take 1 tablet by mouth at bedtime.      metformin (FORTAMET) 500 MG (OSM) 24 hr tablet Take 2 tablets (1,000 mg total) by mouth daily with breakfast. 180 tablet 1   omeprazole (PRILOSEC) 20 MG capsule Take 1 capsule (20 mg total) by mouth daily. 90 capsule 3   No facility-administered medications prior to visit.    Allergies  Allergen Reactions   Fenofibrate Other (See Comments)    Severe back pain   Latex    Nexlizet [Bempedoic Acid-Ezetimibe]     Feels "spaced out and has joint aches"   Other Itching    Seasonal    Pravastatin Sodium     Other reaction(s): Joint Pains   Shellfish Allergy Swelling    Review of Systems  Constitutional:  Positive for chills, fatigue and fever (subjective on Sunday).  HENT:  Positive for congestion, sinus pressure and sinus pain. Negative for ear discharge, ear pain, postnasal drip and sore throat.   Eyes: Negative.   Respiratory:  Positive for cough and shortness of breath (intermittent).   Cardiovascular: Negative.   Gastrointestinal: Negative.   Genitourinary: Negative.   Musculoskeletal:  Positive for myalgias.  Skin: Negative.   Neurological:  Positive for dizziness (intermittent) and headaches. Negative for syncope.      Objective:    Physical Exam Vitals and nursing note reviewed.  Pulmonary:     Comments: Able to talk in complete sentences Neurological:     Mental  Status: She is alert and oriented to person, place, and time.  Psychiatric:        Thought Content: Thought content normal.    LMP  (LMP Unknown)  Wt Readings from Last 3 Encounters:  02/16/21 180 lb (81.6 kg)  01/09/21 180 lb (81.6 kg)  12/30/20 176 lb (79.8 kg)    Health Maintenance Due  Topic Date Due   PNEUMOCOCCAL POLYSACCHARIDE VACCINE AGE 41-64 HIGH RISK  Never done   FOOT EXAM  Never done   Zoster Vaccines- Shingrix (1 of 2) Never done   COVID-19 Vaccine (4 - Booster for [employer]  series) 11/26/2020    There are no preventive care reminders to display for this patient.   Lab Results  Component Value Date   TSH 1.980 05/11/2020   Lab Results  Component Value Date   WBC 6.8 05/11/2020   HGB 14.5 05/11/2020   HCT 43.1 05/11/2020   MCV 89 05/11/2020   PLT 371 05/11/2020   Lab Results  Component Value Date   NA 139 02/16/2021   K 3.9 02/16/2021   CO2 20 02/16/2021   GLUCOSE 152 (H) 02/16/2021   BUN 14 02/16/2021   CREATININE 0.57 02/16/2021   BILITOT 0.4 05/11/2020   ALKPHOS 124 (H) 05/11/2020   AST 21 05/11/2020   ALT 37 (H) 05/11/2020   PROT 7.1 05/11/2020   ALBUMIN 4.5 05/11/2020   CALCIUM 9.8 02/16/2021   ANIONGAP 7 10/11/2017   EGFR 103 02/16/2021   Lab Results  Component Value Date   CHOL 228 (H) 02/16/2021   Lab Results  Component Value Date   HDL 46 02/16/2021   Lab Results  Component Value Date   LDLCALC 119 (H) 02/16/2021   Lab Results  Component Value Date   TRIG 358 (H) 02/16/2021   Lab Results  Component Value Date   CHOLHDL 5.0 (H) 02/16/2021   Lab Results  Component Value Date   HGBA1C 7.2 (H) 02/16/2021       Assessment & Plan:   Problem List Items Addressed This Visit   None Visit Diagnoses     Acute non-recurrent maxillary sinusitis    -  Primary   Sick >14 days. Will treat with doxycycline as amoxicillin upsets her stomach. Diflucan x1 sent as well. Encourage rest, fluids. F/U if symptoms worsen   Relevant  Medications   doxycycline (VIBRA-TABS) 100 MG tablet   fluconazole (DIFLUCAN) 150 MG tablet        Meds ordered this encounter  Medications   doxycycline (VIBRA-TABS) 100 MG tablet    Sig: Take 1 tablet (100 mg total) by mouth 2 (two) times daily.    Dispense:  14 tablet    Refill:  0   fluconazole (DIFLUCAN) 150 MG tablet    Sig: Take 1 tablet (150 mg total) by mouth once for 1 dose.    Dispense:  1 tablet    Refill:  0    This visit was completed via telephone due to the restrictions of the COVID-19 pandemic. All issues as above were discussed and addressed but no physical exam was performed. If it was felt that the patient should be evaluated in the office, they were directed there. The patient verbally consented to this visit. Patient was unable to complete an audio/visual visit due to Lack of equipment. Due to the catastrophic nature of the COVID-19 pandemic, this visit was done through audio contact only. Location of the patient: home Location of the provider: work Those involved with this call:  Provider: Billy Fischer, DNP CMA: Yvonna Alanis, [place] Desk/Registration: Jill Side  Time spent on call:  15 minutes on the phone discussing health concerns. 10 minutes total spent in review of patient's record and preparation of their chart.   Charyl Dancer, NP

## 2021-04-14 ENCOUNTER — Ambulatory Visit: Payer: No Typology Code available for payment source | Admitting: Cardiology

## 2021-05-01 ENCOUNTER — Ambulatory Visit (INDEPENDENT_AMBULATORY_CARE_PROVIDER_SITE_OTHER): Payer: 59 | Admitting: Nurse Practitioner

## 2021-05-01 ENCOUNTER — Other Ambulatory Visit: Payer: Self-pay

## 2021-05-01 ENCOUNTER — Encounter: Payer: Self-pay | Admitting: Nurse Practitioner

## 2021-05-01 VITALS — BP 134/66 | HR 72 | Temp 97.9°F | Wt 182.0 lb

## 2021-05-01 DIAGNOSIS — M721 Knuckle pads: Secondary | ICD-10-CM | POA: Diagnosis not present

## 2021-05-01 DIAGNOSIS — L539 Erythematous condition, unspecified: Secondary | ICD-10-CM

## 2021-05-01 NOTE — Progress Notes (Signed)
BP 134/66   Pulse 72   Temp 97.9 F (36.6 C)   Wt 182 lb (82.6 kg)   LMP  (LMP Unknown)   SpO2 99%   BMI 29.83 kg/m    Subjective:    Patient ID: Debra [name], female    DOB: July 16, 1956, 65 y.o.   MRN: [id number]  HPI: Debra [name] is a 65 y.o. female  Chief Complaint  Patient presents with   Hand Pain    Right ring finger, knot that come up last week, hurts at times    Patient states she is having hand pain that started last week.  Patient states she has a knot on her right ring finger. States her husband noticed it but she didn't even know it was there. Patient states it doesn't really bother her.  However at times she does have difficult closing her hand. Does have some redness. Denies stiffness in the morning.  Relevant past medical, surgical, family and social history reviewed and updated as indicated. Interim medical history since our last visit reviewed. Allergies and medications reviewed and updated.  Review of Systems  Musculoskeletal:        Bump on ring finger knuckle of right hand   Per HPI unless specifically indicated above     Objective:    BP 134/66   Pulse 72   Temp 97.9 F (36.6 C)   Wt 182 lb (82.6 kg)   LMP  (LMP Unknown)   SpO2 99%   BMI 29.83 kg/m   Wt Readings from Last 3 Encounters:  05/01/21 182 lb (82.6 kg)  02/16/21 180 lb (81.6 kg)  01/09/21 180 lb (81.6 kg)    Physical Exam Vitals and nursing note reviewed.  Constitutional:      General: She is not in acute distress.    Appearance: Normal appearance. She is normal weight. She is not ill-appearing, toxic-appearing or diaphoretic.  HENT:     Head: Normocephalic.     Right Ear: External ear normal.     Left Ear: External ear normal.     Nose: Nose normal.     Mouth/Throat:     Mouth: Mucous membranes are moist.     Pharynx: Oropharynx is clear.  Eyes:     General:        Right eye: No discharge.        Left eye: No discharge.     Extraocular Movements: Extraocular  movements intact.     Conjunctiva/sclera: Conjunctivae normal.     Pupils: Pupils are equal, round, and reactive to light.  Cardiovascular:     Rate and Rhythm: Normal rate and regular rhythm.     Heart sounds: No murmur heard. Pulmonary:     Effort: Pulmonary effort is normal. No respiratory distress.     Breath sounds: Normal breath sounds. No wheezing or rales.  Musculoskeletal:     Right hand: No swelling or tenderness. Decreased range of motion.       Arms:     Cervical back: Normal range of motion and neck supple.  Skin:    General: Skin is warm and dry.     Capillary Refill: Capillary refill takes less than 2 seconds.  Neurological:     General: No focal deficit present.     Mental Status: She is alert and oriented to person, place, and time. Mental status is at baseline.  Psychiatric:        Mood and Affect: Mood normal.  Behavior: Behavior normal.        Thought Content: Thought content normal.        Judgment: Judgment normal.    Results for orders placed or performed in visit on 02/20/21  HM DIABETES EYE EXAM  Result Value Ref Range   HM Diabetic Eye Exam No Retinopathy No Retinopathy      Assessment & Plan:   Problem List Items Addressed This Visit   None Visit Diagnoses     Knuckle pads    -  Primary   Will draw lab work to rule out Rhematoid arthritis or other autoimmune. If lab work is unremarkable, will order xray to evaluate hand.   Relevant Orders   Antinuclear Antib (ANA)   Comp Met (CMET)   CBC w/Diff   Redness       Relevant Orders   Antinuclear Antib (ANA)   Comp Met (CMET)   CBC w/Diff        Follow up plan: Return if symptoms worsen or fail to improve.

## 2021-05-02 LAB — COMPREHENSIVE METABOLIC PANEL
ALT: 26 IU/L (ref 0–32)
AST: 18 IU/L (ref 0–40)
Albumin/Globulin Ratio: 2 (ref 1.2–2.2)
Albumin: 4.8 g/dL (ref 3.8–4.8)
Alkaline Phosphatase: 112 IU/L (ref 44–121)
BUN/Creatinine Ratio: 23 (ref 12–28)
BUN: 13 mg/dL (ref 8–27)
Bilirubin Total: 0.2 mg/dL (ref 0.0–1.2)
CO2: 26 mmol/L (ref 20–29)
Calcium: 9.9 mg/dL (ref 8.7–10.3)
Chloride: 101 mmol/L (ref 96–106)
Creatinine, Ser: 0.56 mg/dL — ABNORMAL LOW (ref 0.57–1.00)
Globulin, Total: 2.4 g/dL (ref 1.5–4.5)
Glucose: 88 mg/dL (ref 65–99)
Potassium: 4.1 mmol/L (ref 3.5–5.2)
Sodium: 143 mmol/L (ref 134–144)
Total Protein: 7.2 g/dL (ref 6.0–8.5)
eGFR: 103 mL/min/{1.73_m2} (ref >59)

## 2021-05-02 LAB — CBC WITH DIFFERENTIAL/PLATELET
Basophils Absolute: 0.1 10*3/uL (ref 0.0–0.2)
Basos: 1 %
EOS (ABSOLUTE): 0.1 10*3/uL (ref 0.0–0.4)
Eos: 1 %
Hematocrit: 43.3 % (ref 34.0–46.6)
Hemoglobin: 14.1 g/dL (ref 11.1–15.9)
Immature Grans (Abs): 0 10*3/uL (ref 0.0–0.1)
Immature Granulocytes: 0 %
Lymphocytes Absolute: 2.8 10*3/uL (ref 0.7–3.1)
Lymphs: 35 %
MCH: 29.3 pg (ref 26.6–33.0)
MCHC: 32.6 g/dL (ref 31.5–35.7)
MCV: 90 fL (ref 79–97)
Monocytes Absolute: 0.6 10*3/uL (ref 0.1–0.9)
Monocytes: 7 %
Neutrophils Absolute: 4.4 10*3/uL (ref 1.4–7.0)
Neutrophils: 56 %
Platelets: 372 10*3/uL (ref 150–450)
RBC: 4.82 x10E6/uL (ref 3.77–5.28)
RDW: 12.9 % (ref 11.7–15.4)
WBC: 8 10*3/uL (ref 3.4–10.8)

## 2021-05-02 LAB — ANA: Anti Nuclear Antibody (ANA): NEGATIVE

## 2021-05-02 NOTE — Progress Notes (Signed)
Hi Debra [name]. So far your lab work looks good.  Your ANA (autoimmune lab) isn't back yet. I will send you another message once it is back.

## 2021-05-02 NOTE — Progress Notes (Signed)
Hi Debra [name]. Your ANA is negative which is great news.  No evidence of autoimmune disorder right now.  Would you like to proceed with the xray?

## 2021-05-11 ENCOUNTER — Other Ambulatory Visit: Payer: Self-pay

## 2021-05-11 MED ORDER — EZETIMIBE 10 MG PO TABS: 10.0000 mg | ORAL_TABLET | Freq: Every day | ORAL | 0 refills | Status: DC

## 2021-06-26 ENCOUNTER — Ambulatory Visit: Payer: No Typology Code available for payment source | Admitting: Cardiology

## 2021-07-28 ENCOUNTER — Ambulatory Visit: Payer: Self-pay | Admitting: Cardiology

## 2021-08-03 ENCOUNTER — Other Ambulatory Visit: Payer: Self-pay | Admitting: Nurse Practitioner

## 2021-08-03 NOTE — Telephone Encounter (Signed)
Requested medications are due for refill today.  no  Requested medications are on the active medications list.  no  Last refill. 02/20/2021  Future visit scheduled.   yes  Notes to clinic.  Medication was d/c'd on 02/20/2021.

## 2021-08-03 NOTE — Telephone Encounter (Signed)
Please see below and advise as needed.

## 2021-08-04 ENCOUNTER — Encounter: Payer: Self-pay | Admitting: Cardiology

## 2021-08-04 ENCOUNTER — Ambulatory Visit (INDEPENDENT_AMBULATORY_CARE_PROVIDER_SITE_OTHER): Payer: 59 | Admitting: Cardiology

## 2021-08-04 ENCOUNTER — Other Ambulatory Visit: Payer: Self-pay

## 2021-08-04 VITALS — BP 132/80 | HR 85 | Ht 65.0 in | Wt 178.0 lb

## 2021-08-04 DIAGNOSIS — I10 Essential (primary) hypertension: Secondary | ICD-10-CM

## 2021-08-04 DIAGNOSIS — E7800 Pure hypercholesterolemia, unspecified: Secondary | ICD-10-CM

## 2021-08-04 NOTE — Progress Notes (Signed)
Cardiology Office Note:    Date:  08/04/2021   ID:  Debra [name], DOB 07/09/1956, MRN [id number]  PCP:  Jon Billings, NP  [facility] Cardiologist:  Kate Sable, MD  [place] Electrophysiologist:  None   Referring MD: Florian Buff*   Chief Complaint  Patient presents with   Other    3 month follow up -- Meds reviewed verbally with patient.     History of Present Illness:    Debra [name] is a 66 y.o. female with a hx of hypertension, hyperlipidemia who presents for follow-up.   Patient being seen for hyperlipidemia and hypertension.  She is intolerant to statins, fenofibrate's.  Insurance did not approve PCSK9's or Vascepa.  Currently taking Zetia.  Plans to change insurance company in about 2 months.  Hoping medications will be approved this time.  She tolerates Zetia, feels well, blood pressures well controlled.  Prior notes Echocardiogram 12/6627 normal systolic and diastolic function, EF 60 to 65% Coronary CTA 06/2020 normal, no evidence of CAD. She states all of her brothers and mother have history of high cholesterol.  Past Medical History:  Diagnosis Date   Allergies    Borderline diabetes 03/30/2015   GERD (gastroesophageal reflux disease)    Hyperlipidemia    Hypertension    Lumbar back pain    Pre-diabetes    during pregnancy     Past Surgical History:  Procedure Laterality Date   ABDOMINAL HYSTERECTOMY  1998   CARPAL TUNNEL RELEASE Right    COLONOSCOPY WITH PROPOFOL N/A 12/30/2020   Procedure: COLONOSCOPY WITH PROPOFOL;  Surgeon: Lucilla Lame, MD;  Location: [facility];  Service: Endoscopy;  Laterality: N/A;   LUMBAR LAMINECTOMY/DECOMPRESSION MICRODISCECTOMY Left 10/16/2017   Procedure: Microlumbar decompression L4-5, L5-S1 left;  Surgeon: Susa Day, MD;  Location: [facility];  Service: Orthopedics;  Laterality: Left;  120 mins   PLANTAR FASCIA RELEASE Left    POLYPECTOMY  12/30/2020   Procedure: POLYPECTOMY;  Surgeon:  Lucilla Lame, MD;  Location: [facility];  Service: Endoscopy;;   RHINOPLASTY  1988   SHOULDER SURGERY Right 2011   TEMPOROMANDIBULAR JOINT SURGERY  1990    Current Medications: Current Meds  Medication Sig   Biotin 1000 MCG CHEW Chew 1 each by mouth daily.   Cholecalciferol (VITAMIN D3 PO) Take 1,500 Units by mouth daily.   EPINEPHrine 0.3 mg/0.3 mL IJ SOAJ injection INJECT 1 PEN IN THIGH MUSCLE ONE TIME FOR ALLERGIC REACTION. MAY REPEAT IN 5 TO 15 MINUTES IF NEEDED   ezetimibe (ZETIA) 10 MG tablet Take 1 tablet (10 mg total) by mouth daily.   fexofenadine (ALLEGRA) 180 MG tablet Take 180 mg by mouth daily.   hydrochlorothiazide (HYDRODIURIL) 25 MG tablet Take 1 tablet (25 mg total) by mouth daily.   losartan (COZAAR) 50 MG tablet Take 1 tablet (50 mg total) by mouth daily.   Melatonin 10 MG TABS Take 1 tablet by mouth at bedtime.    metformin (FORTAMET) 500 MG (OSM) 24 hr tablet Take 2 tablets (1,000 mg total) by mouth daily with breakfast.   omeprazole (PRILOSEC) 20 MG capsule Take 1 capsule (20 mg total) by mouth daily.     Allergies:   Fenofibrate, Latex, Nexlizet [bempedoic acid-ezetimibe], Other, Pravastatin sodium, and Shellfish allergy   Social History   Socioeconomic History   Marital status: Married    Spouse name: Not on file   Number of children: Not on file   Years of education: Not on  file   Highest education level: Not on file  Occupational History   Not on file  Tobacco Use   Smoking status: Former    Types: Cigarettes   Smokeless tobacco: Never   Tobacco comments:    33 years quit   Vaping Use   Vaping Use: Never used  Substance and Sexual Activity   Alcohol use: Yes    Alcohol/week: 0.0 standard drinks    Comment: OCCASIONALLY   Drug use: No   Sexual activity: Not Currently  Other Topics Concern   Not on file  Social History Narrative   Not on file   Social Determinants of Health   Financial Resource Strain: Not on file  Food  Insecurity: Not on file  Transportation Needs: Not on file  Physical Activity: Not on file  Stress: Not on file  Social Connections: Not on file     Family History: The patient's family history includes Alcohol abuse in her mother; Breast cancer (age of onset: 72) in her mother; Hyperlipidemia in her brother, brother, and sister; Hypertension in her son; Obesity in her son. She was adopted.  ROS:   Please see the history of present illness.     All other systems reviewed and are negative.  EKGs/Labs/Other Studies Reviewed:    The following studies were reviewed today:   EKG:  EKG is ordered today.  EKG shows sinus rhythm, normal ECG  Recent Labs: 05/01/2021: ALT 26; BUN 13; Creatinine, Ser 0.56; Hemoglobin 14.1; Platelets 372; Potassium 4.1; Sodium 143  Recent Lipid Panel    Component Value Date/Time   CHOL 228 (H) 02/16/2021 0832   TRIG 358 (H) 02/16/2021 0832   HDL 46 02/16/2021 0832   CHOLHDL 5.0 (H) 02/16/2021 0832   CHOLHDL 4.7 10/03/2020 0754   VLDL 59 (H) 10/03/2020 0754   LDLCALC 119 (H) 02/16/2021 0832   LDLCALC 171 (H) 08/16/2017 0801    Physical Exam:    VS:  BP 132/80 (BP Location: Left Arm, Patient Position: Sitting, Cuff Size: Normal)   Pulse 85   Ht 5\' 5"  (1.651 m)   Wt 178 lb (80.7 kg)   LMP  (LMP Unknown)   SpO2 97%   BMI 29.62 kg/m     Wt Readings from Last 3 Encounters:  08/04/21 178 lb (80.7 kg)  05/01/21 182 lb (82.6 kg)  02/16/21 180 lb (81.6 kg)     GEN:  Well nourished, well developed in no acute distress HEENT: Normal NECK: No JVD; No carotid bruits LYMPHATICS: No lymphadenopathy CARDIAC: RRR, no murmurs, rubs, gallops RESPIRATORY:  Clear to auscultation without rales, wheezing or rhonchi  ABDOMEN: Soft, non-tender, non-distended MUSCULOSKELETAL:  No edema; No deformity  SKIN: Warm and dry NEUROLOGIC:  Alert and oriented x 3 PSYCHIATRIC:  Normal affect   ASSESSMENT:    1. Primary hypertension   2. Pure hypercholesterolemia      PLAN:    In order of problems listed above:  hypertension, BP controlled.  Continue HCTZ and losartan. Patient with history of hyperlipidemia, likely familial hypercholesterolemia as her brothers and mother also has elevated cholesterol levels.  Not tolerant to statins.  Current insurance did not approve PCSK9 or Vascepa.  She did not tolerate Nexlizet or fibrates.  Continue Zetia, low-cholesterol diet advised, repeat fasting lipid profile in 6 weeks.  Plan to prescribe PCSK9's if she obtains a new insurance company.    Follow-up in 3 months  Total encounter time 30 minutes  Greater than 50% was spent in  counseling and coordination of care with the patient   This note was generated in part or whole with voice recognition software. Voice recognition is usually quite accurate but there are transcription errors that can and very often do occur. I apologize for any typographical errors that were not detected and corrected.  Medication Adjustments/Labs and Tests Ordered: Current medicines are reviewed at length with the patient today.  Concerns regarding medicines are outlined above.  Orders Placed This Encounter  Procedures   Lipid panel   EKG 12-Lead    No orders of the defined types were placed in this encounter.   Patient Instructions  Medication Instructions:   Your physician recommends that you continue on your current medications as directed. Please refer to the Current Medication list given to you today.  *If you need a refill on your cardiac medications before your next appointment, please call your pharmacy*   Lab Work:  Your physician recommends that you return for a FASTING lipid profile: in [place] will need to be fasting. Please do not have anything to eat or drink after midnight the morning you have the lab work. You may only have water or black coffee with no cream or sugar.   We will call you closer to your appointment time and schedule you in our  office for this lab draw.    Testing/Procedures: None ordered   Follow-Up: At [facility], you and your health needs are our priority.  As part of our continuing mission to provide you with exceptional heart care, we have created designated Provider Care Teams.  These Care Teams include your primary Cardiologist (physician) and Advanced Practice Providers (APPs -  Physician Assistants and Nurse Practitioners) who all work together to provide you with the care you need, when you need it.  We recommend signing up for the patient portal called "MyChart".  Sign up information is provided on this After Visit Summary.  MyChart is used to connect with patients for Virtual Visits (Telemedicine).  Patients are able to view lab/test results, encounter notes, upcoming appointments, etc.  Non-urgent messages can be sent to your provider as well.   To learn more about what you can do with MyChart, go to [email or web address].    Your next appointment:   3 month(s)  The format for your next appointment:   In Person  Provider:   You may see Kate Sable, MD or one of the following Advanced Practice Providers on your designated Care Team:   Murray Hodgkins, NP Christell Faith, PA-C Marrianne Mood, PA-C Cadence Wayne, [place]   Other Instructions     Signed, Kate Sable, MD  08/04/2021 4:51 PM    [place]

## 2021-08-04 NOTE — Patient Instructions (Signed)
Medication Instructions:   Your physician recommends that you continue on your current medications as directed. Please refer to the Current Medication list given to you today.  *If you need a refill on your cardiac medications before your next appointment, please call your pharmacy*   Lab Work:  Your physician recommends that you return for a FASTING lipid profile: in [place] will need to be fasting. Please do not have anything to eat or drink after midnight the morning you have the lab work. You may only have water or black coffee with no cream or sugar.   We will call you closer to your appointment time and schedule you in our office for this lab draw.    Testing/Procedures: None ordered   Follow-Up: At [facility], you and your health needs are our priority.  As part of our continuing mission to provide you with exceptional heart care, we have created designated Provider Care Teams.  These Care Teams include your primary Cardiologist (physician) and Advanced Practice Providers (APPs -  Physician Assistants and Nurse Practitioners) who all work together to provide you with the care you need, when you need it.  We recommend signing up for the patient portal called "MyChart".  Sign up information is provided on this After Visit Summary.  MyChart is used to connect with patients for Virtual Visits (Telemedicine).  Patients are able to view lab/test results, encounter notes, upcoming appointments, etc.  Non-urgent messages can be sent to your provider as well.   To learn more about what you can do with MyChart, go to [email or web address].    Your next appointment:   3 month(s)  The format for your next appointment:   In Person  Provider:   You may see Kate Sable, MD or one of the following Advanced Practice Providers on your designated Care Team:   Murray Hodgkins, NP Christell Faith, PA-C Marrianne Mood, PA-C Cadence Kathlen Mody, [place]   Other Instructions

## 2021-08-07 ENCOUNTER — Telehealth: Payer: Self-pay | Admitting: Cardiology

## 2021-08-07 NOTE — Telephone Encounter (Signed)
*  STAT* If patient is at the pharmacy, call can be transferred to refill team.   1. Which medications need to be refilled? (please list name of each medication and dose if known) Metformin 500mg  2 tablets daily  2. Which pharmacy/location (including street and city if local pharmacy) is medication to be sent to? [place], [place]  3. Do they need a 30 day or 90 day supply? 90 supply

## 2021-08-07 NOTE — Telephone Encounter (Signed)
Spoke with patient and advised her to get metformin Rx through PCP. She states she thought this was for her cholesterol but now understands that this medication is used to control blood sugars. Patient will contact PCP for refill.

## 2021-08-08 ENCOUNTER — Other Ambulatory Visit: Payer: Self-pay | Admitting: Nurse Practitioner

## 2021-08-08 MED ORDER — OMEPRAZOLE 20 MG PO CPDR: 20.0000 mg | DELAYED_RELEASE_CAPSULE | Freq: Every day | ORAL | 0 refills | Status: DC

## 2021-08-08 NOTE — Telephone Encounter (Signed)
Medication Refill - Medication: Omeprazole  Has the patient contacted their pharmacy? Yes.   Pt states that the pharmacy states that they do not have any refills on file for pt. Please advise.  (Agent: If no, request that the patient contact the pharmacy for the refill. If patient does not wish to contact the pharmacy document the reason why and proceed with request.) (Agent: If yes, when and what did the pharmacy advise?)  Preferred Pharmacy (with phone number or street name):  Has the patient been [facility] [place], [place]  [address] [place] [address]  Phone: [phone] Fax: [phone]  Hours: Not open 24 hours   seen for an appointment in the last year OR does the patient have an upcoming appointment? Yes.    Agent: Please be advised that RX refills may take up to 3 business days. We ask that you follow-up with your pharmacy.

## 2021-08-08 NOTE — Telephone Encounter (Signed)
Requested Prescriptions  Pending Prescriptions Disp Refills  . omeprazole (PRILOSEC) 20 MG capsule 90 capsule 0    Sig: Take 1 capsule (20 mg total) by mouth daily.     Gastroenterology: Proton Pump Inhibitors Passed - 08/08/2021 11:57 AM      Passed - Valid encounter within last 12 months    Recent Outpatient Visits          3 months ago Knuckle pads   [place], Santiago Glad, NP   4 months ago Acute non-recurrent maxillary sinusitis   [facility] McElwee, Lauren A, NP   5 months ago Upper respiratory tract infection, unspecified type   [facility] Jon Billings, NP   5 months ago Controlled type 2 diabetes mellitus without complication, without long-term current use of insulin ([place])   [facility] Jon Billings, NP   9 months ago Upper respiratory tract infection, unspecified type   [facility] Trinna Post, PA-C      Future Appointments            In 1 week Jon Billings, NP [facility], [place]   In 3 months [place], Aaron Edelman, MD [facility]

## 2021-08-17 NOTE — Progress Notes (Signed)
BP 110/64   Pulse 74   Temp (!) 97.1 F (36.2 C) (Oral)   Ht 5' 6.2" (1.681 m)   Wt 178 lb 3.2 oz (80.8 kg)   LMP  (LMP Unknown)   SpO2 99%   BMI 28.59 kg/m    Subjective:    Patient ID: Debra [name], female    DOB: 01-24-1956, 65 y.o.   MRN: [id number]  HPI: Debra [name] is a 65 y.o. female presenting on 08/18/2021 for comprehensive medical examination. Current medical complaints include:none  She currently lives with: Menopausal Symptoms: no  HYPERTENSION / HYPERLIPIDEMIA Satisfied with current treatment? no Duration of hypertension: years BP monitoring frequency: not checking BP range:  BP medication side effects: no Past BP meds: HCTZ and losartan (cozaar) Duration of hyperlipidemia: years Cholesterol medication side effects: no Cholesterol supplements: none Past cholesterol medications: ezetimide (zetia) Medication compliance: excellent compliance Aspirin: no Recent stressors: no Recurrent headaches: no Visual changes: no Palpitations: no Dyspnea: no Chest pain: no Lower extremity edema: no Dizzy/lightheaded: no  DIABETES Hypoglycemic episodes:no Polydipsia/polyuria: no Visual disturbance: no Chest pain: no Paresthesias: no Glucose Monitoring: no  Accucheck frequency: Not Checking  Fasting glucose:  Post prandial:  Evening:  Before meals: Taking Insulin?: no  Long acting insulin:  Short acting insulin: Blood Pressure Monitoring: not checking Retinal Examination: Up to Date Foot Exam: Up to Date Diabetic Education: Not Completed Pneumovax: Up to Date Influenza: Up to Date Aspirin: no   Depression Screen done today and results listed below:  Depression screen [facility] 2/9 08/18/2021 03/02/2021 02/16/2021 05/06/2020 09/01/2018  Decreased Interest 0 0 0 0 0  Down, Depressed, Hopeless 0 0 0 0 0  PHQ - 2 Score 0 0 0 0 0  Altered sleeping 0 - - - -  Tired, decreased energy 0 - - - -  Change in appetite 0 - - - -  Feeling bad or failure about  yourself  0 - - - -  Trouble concentrating 0 - - - -  Moving slowly or fidgety/restless 0 - - - -  Suicidal thoughts 0 - - - -  PHQ-9 Score 0 - - - -  Difficult doing work/chores Not difficult at all - - - -    The patient does not have a history of falls. I did complete a risk assessment for falls. A plan of care for falls was documented.   Past Medical History:  Past Medical History:  Diagnosis Date   Allergies    Borderline diabetes 03/30/2015   GERD (gastroesophageal reflux disease)    Hyperlipidemia    Hypertension    Lumbar back pain    Pre-diabetes    during pregnancy     Surgical History:  Past Surgical History:  Procedure Laterality Date   CARPAL TUNNEL RELEASE Right    COLONOSCOPY WITH PROPOFOL N/A 12/30/2020   Procedure: COLONOSCOPY WITH PROPOFOL;  Surgeon: Lucilla Lame, MD;  Location: [place];  Service: Endoscopy;  Laterality: N/A;   LUMBAR LAMINECTOMY/DECOMPRESSION MICRODISCECTOMY Left 10/16/2017   Procedure: Microlumbar decompression L4-5, L5-S1 left;  Surgeon: Susa Day, MD;  Location: [facility];  Service: Orthopedics;  Laterality: Left;  120 mins   PLANTAR FASCIA RELEASE Left    POLYPECTOMY  12/30/2020   Procedure: POLYPECTOMY;  Surgeon: Lucilla Lame, MD;  Location: [place];  Service: Endoscopy;;   [place] Right 2011   [place]  Medications:  Current Outpatient Medications on File Prior to Visit  Medication Sig   Biotin 1000 MCG CHEW Chew 1 each by mouth daily.   Cholecalciferol (VITAMIN D3 PO) Take 1,500 Units by mouth daily.   EPINEPHrine 0.3 mg/0.3 mL IJ SOAJ injection INJECT 1 PEN IN THIGH MUSCLE ONE TIME FOR ALLERGIC REACTION. MAY REPEAT IN 5 TO 15 MINUTES IF NEEDED   fexofenadine (ALLEGRA) 180 MG tablet Take 180 mg by mouth daily.   Melatonin 10 MG TABS Take 1 tablet by mouth at bedtime.    omeprazole (PRILOSEC) 20 MG capsule  Take 1 capsule (20 mg total) by mouth daily.   vitamin C (ASCORBIC ACID) 500 MG tablet Take 500 mg by mouth daily.   No current facility-administered medications on file prior to visit.    Allergies:  Allergies  Allergen Reactions   Fenofibrate Other (See Comments)    Severe back pain   Latex    Nexlizet [Bempedoic Acid-Ezetimibe]     Feels "spaced out and has joint aches"   Other Itching    Seasonal    Pravastatin Sodium     Other reaction(s): Joint Pains   Shellfish Allergy Swelling    Social History:  Social History   Socioeconomic History   Marital status: Married    Spouse name: Not on file   Number of children: Not on file   Years of education: Not on file   Highest education level: Not on file  Occupational History   Not on file  Tobacco Use   Smoking status: Former    Types: Cigarettes   Smokeless tobacco: Never   Tobacco comments:    33 years quit   Vaping Use   Vaping Use: Never used  Substance and Sexual Activity   Alcohol use: Yes    Alcohol/week: 0.0 standard drinks    Comment: OCCASIONALLY   Drug use: No   Sexual activity: Not Currently  Other Topics Concern   Not on file  Social History Narrative   Not on file   Social Determinants of Health   Financial Resource Strain: Not on file  Food Insecurity: Not on file  Transportation Needs: Not on file  Physical Activity: Not on file  Stress: Not on file  Social Connections: Not on file  Intimate Partner Violence: Not on file   Social History   Tobacco Use  Smoking Status Former   Types: Cigarettes  Smokeless Tobacco Never  Tobacco Comments   33 years quit    Social History   Substance and Sexual Activity  Alcohol Use Yes   Alcohol/week: 0.0 standard drinks   Comment: OCCASIONALLY    Family History:  Family History  Adopted: Yes  Problem Relation Age of Onset   Breast cancer Mother 37   Alcohol abuse Mother    Hyperlipidemia Sister    Hyperlipidemia Brother     Hyperlipidemia Brother    Obesity Son    Hypertension Son     Past medical history, surgical history, medications, allergies, family history and social history reviewed with patient today and changes made to appropriate areas of the chart.   Review of Systems  HENT:         Denies vision changes.  Eyes:  Negative for blurred vision and double vision.  Respiratory:  Negative for shortness of breath.   Cardiovascular:  Negative for chest pain, palpitations and leg swelling.  Neurological:  Negative for dizziness, tingling and headaches.  Endo/Heme/Allergies:  Negative for polydipsia.  Denies Polyuria  All other ROS negative except what is listed above and in the HPI.      Objective:    BP 110/64   Pulse 74   Temp (!) 97.1 F (36.2 C) (Oral)   Ht 5' 6.2" (1.681 m)   Wt 178 lb 3.2 oz (80.8 kg)   LMP  (LMP Unknown)   SpO2 99%   BMI 28.59 kg/m   Wt Readings from Last 3 Encounters:  08/18/21 178 lb 3.2 oz (80.8 kg)  08/04/21 178 lb (80.7 kg)  05/01/21 182 lb (82.6 kg)    Physical Exam Vitals and nursing note reviewed.  Constitutional:      General: She is awake. She is not in acute distress.    Appearance: She is well-developed. She is not ill-appearing.  HENT:     Head: Normocephalic and atraumatic.     Right Ear: Hearing, tympanic membrane, ear canal and external ear normal. No drainage.     Left Ear: Hearing, tympanic membrane, ear canal and external ear normal. No drainage.     Nose: Nose normal.     Right Sinus: No maxillary sinus tenderness or frontal sinus tenderness.     Left Sinus: No maxillary sinus tenderness or frontal sinus tenderness.     Mouth/Throat:     Mouth: Mucous membranes are moist.     Pharynx: Oropharynx is clear. Uvula midline. No pharyngeal swelling, oropharyngeal exudate or posterior oropharyngeal erythema.  Eyes:     General: Lids are normal.        Right eye: No discharge.        Left eye: No discharge.     Extraocular Movements:  Extraocular movements intact.     Conjunctiva/sclera: Conjunctivae normal.     Pupils: Pupils are equal, round, and reactive to light.     Visual Fields: Right eye visual fields normal and left eye visual fields normal.  Neck:     Thyroid: No thyromegaly.     Vascular: No carotid bruit.     Trachea: Trachea normal.  Cardiovascular:     Rate and Rhythm: Normal rate and regular rhythm.     Heart sounds: Normal heart sounds. No murmur heard.   No gallop.  Pulmonary:     Effort: Pulmonary effort is normal. No accessory muscle usage or respiratory distress.     Breath sounds: Normal breath sounds.  Chest:  Breasts:    Right: Normal.     Left: Normal.  Abdominal:     General: Bowel sounds are normal.     Palpations: Abdomen is soft. There is no hepatomegaly or splenomegaly.     Tenderness: There is no abdominal tenderness.  Musculoskeletal:        General: Normal range of motion.     Cervical back: Normal range of motion and neck supple.     Right lower leg: No edema.     Left lower leg: No edema.  Lymphadenopathy:     Head:     Right side of head: No submental, submandibular, tonsillar, preauricular or posterior auricular adenopathy.     Left side of head: No submental, submandibular, tonsillar, preauricular or posterior auricular adenopathy.     Cervical: No cervical adenopathy.     Upper Body:     Right upper body: No supraclavicular, axillary or pectoral adenopathy.     Left upper body: No supraclavicular, axillary or pectoral adenopathy.  Skin:    General: Skin is warm and dry.  Capillary Refill: Capillary refill takes less than 2 seconds.     Findings: No rash.  Neurological:     Mental Status: She is alert and oriented to person, place, and time.     Gait: Gait is intact.     Deep Tendon Reflexes: Reflexes are normal and symmetric.     Reflex Scores:      Brachioradialis reflexes are 2+ on the right side and 2+ on the left side.      Patellar reflexes are 2+ on the  right side and 2+ on the left side. Psychiatric:        Attention and Perception: Attention normal.        Mood and Affect: Mood normal.        Speech: Speech normal.        Behavior: Behavior normal. Behavior is cooperative.        Thought Content: Thought content normal.        Judgment: Judgment normal.    Results for orders placed or performed in visit on 05/01/21  Antinuclear Antib (ANA)  Result Value Ref Range   Anti Nuclear Antibody (ANA) Negative Negative  Comp Met (CMET)  Result Value Ref Range   Glucose 88 65 - 99 mg/dL   BUN 13 8 - 27 mg/dL   Creatinine, Ser 0.56 (L) 0.57 - 1.00 mg/dL   eGFR 103 >59 mL/min/1.73   BUN/Creatinine Ratio 23 12 - 28   Sodium 143 134 - 144 mmol/L   Potassium 4.1 3.5 - 5.2 mmol/L   Chloride 101 96 - 106 mmol/L   CO2 26 20 - 29 mmol/L   Calcium 9.9 8.7 - 10.3 mg/dL   Total Protein 7.2 6.0 - 8.5 g/dL   Albumin 4.8 3.8 - 4.8 g/dL   Globulin, Total 2.4 1.5 - 4.5 g/dL   Albumin/Globulin Ratio 2.0 1.2 - 2.2   Bilirubin Total 0.2 0.0 - 1.2 mg/dL   Alkaline Phosphatase 112 44 - 121 IU/L   AST 18 0 - 40 IU/L   ALT 26 0 - 32 IU/L  CBC w/Diff  Result Value Ref Range   WBC 8.0 3.4 - 10.8 x10E3/uL   RBC 4.82 3.77 - 5.28 x10E6/uL   Hemoglobin 14.1 11.1 - 15.9 g/dL   Hematocrit 43.3 34.0 - 46.6 %   MCV 90 79 - 97 fL   MCH 29.3 26.6 - 33.0 pg   MCHC 32.6 31.5 - 35.7 g/dL   RDW 12.9 11.7 - 15.4 %   Platelets 372 150 - 450 x10E3/uL   Neutrophils 56 Not Estab. %   Lymphs 35 Not Estab. %   Monocytes 7 Not Estab. %   Eos 1 Not Estab. %   Basos 1 Not Estab. %   Neutrophils Absolute 4.4 1.4 - 7.0 x10E3/uL   Lymphocytes Absolute 2.8 0.7 - 3.1 x10E3/uL   Monocytes Absolute 0.6 0.1 - 0.9 x10E3/uL   EOS (ABSOLUTE) 0.1 0.0 - 0.4 x10E3/uL   Basophils Absolute 0.1 0.0 - 0.2 x10E3/uL   Immature Granulocytes 0 Not Estab. %   Immature Grans (Abs) 0.0 0.0 - 0.1 x10E3/uL      Assessment & Plan:   Problem List Items Addressed This Visit        Cardiovascular and Mediastinum   Essential hypertension    Chronic.  Controlled.  Continue with current medication regimen on Hydrochlorothiazide and Losartan 83m.  Refills sent today.  Labs ordered today.  Return to clinic in 6 months for reevaluation.  Call  sooner if concerns arise.        Relevant Medications   losartan (COZAAR) 50 MG tablet   hydrochlorothiazide (HYDRODIURIL) 25 MG tablet   ezetimibe (ZETIA) 10 MG tablet     Endocrine   Diabetes type 2, controlled (HCC)    Chronic.  Controlled.  Continue with current medication regimen on Metformin 5106m BID.  Refills snet today.  Labs ordered today.  Return to clinic in 6 months for reevaluation.  Call sooner if concerns arise.        Relevant Medications   metformin (FORTAMET) 500 MG (OSM) 24 hr tablet   losartan (COZAAR) 50 MG tablet   Other Relevant Orders   HgB A1c     Other   Hypercholesteremia    Chronic.  Controlled.  Continue with current medication regimen on Zetia 150m  Refill sent today.  Labs ordered today.  Return to clinic in 6 months for reevaluation.  Call sooner if concerns arise.        Relevant Medications   losartan (COZAAR) 50 MG tablet   hydrochlorothiazide (HYDRODIURIL) 25 MG tablet   ezetimibe (ZETIA) 10 MG tablet   Other Relevant Orders   Lipid panel   Other Visit Diagnoses     Annual physical exam    -  Primary   Health maintenance reviewed during visit today. Labs ordered.    Relevant Orders   CBC with Differential/Platelet   Comprehensive metabolic panel   TSH   Urinalysis, Routine w reflex microscopic        Follow up plan: Return in about 6 months (around 02/15/2022) for HTN, HLD, DM2 FU.   LABORATORY TESTING:  - Pap smear: up to date  IMMUNIZATIONS:   - Tdap: Tetanus vaccination status reviewed: last tetanus booster within 10 years. - Influenza: Up to date - Pneumovax: Up to date - Prevnar: Not applicable - COVID: Up to date - HPV: Not applicable - Shingrix vaccine:   Discussed during visit today  SCREENING: -Mammogram: Up to date  - Colonoscopy: Up to date  - Bone Density: Not applicable  -Hearing Test: Not applicable  -Spirometry: Not applicable   PATIENT COUNSELING:   Advised to take 1 mg of folate supplement per day if capable of pregnancy.   Sexuality: Discussed sexually transmitted diseases, partner selection, use of condoms, avoidance of unintended pregnancy  and contraceptive alternatives.   Advised to avoid cigarette smoking.  I discussed with the patient that most people either abstain from alcohol or drink within safe limits (<=14/week and <=4 drinks/occasion for males, <=7/weeks and <= 3 drinks/occasion for females) and that the risk for alcohol disorders and other health effects rises proportionally with the number of drinks per week and how often a drinker exceeds daily limits.  Discussed cessation/primary prevention of drug use and availability of treatment for abuse.   Diet: Encouraged to adjust caloric intake to maintain  or achieve ideal body weight, to reduce intake of dietary saturated fat and total fat, to limit sodium intake by avoiding high sodium foods and not adding table salt, and to maintain adequate dietary potassium and calcium preferably from fresh fruits, vegetables, and low-fat dairy products.    stressed the importance of regular exercise  Injury prevention: Discussed safety belts, safety helmets, smoke detector, smoking near bedding or upholstery.   Dental health: Discussed importance of regular tooth brushing, flossing, and dental visits.    NEXT PREVENTATIVE PHYSICAL DUE IN 1 YEAR. Return in about 6 months (around 02/15/2022) for HTN, HLD,  DM2 FU.

## 2021-08-18 ENCOUNTER — Encounter: Payer: Self-pay | Admitting: Nurse Practitioner

## 2021-08-18 ENCOUNTER — Ambulatory Visit (INDEPENDENT_AMBULATORY_CARE_PROVIDER_SITE_OTHER): Payer: 59 | Admitting: Nurse Practitioner

## 2021-08-18 ENCOUNTER — Other Ambulatory Visit: Payer: Self-pay

## 2021-08-18 VITALS — BP 110/64 | HR 74 | Temp 97.1°F | Ht 66.2 in | Wt 178.2 lb

## 2021-08-18 DIAGNOSIS — Z0000 Encounter for general adult medical examination without abnormal findings: Secondary | ICD-10-CM | POA: Diagnosis not present

## 2021-08-18 DIAGNOSIS — I10 Essential (primary) hypertension: Secondary | ICD-10-CM | POA: Diagnosis not present

## 2021-08-18 DIAGNOSIS — E119 Type 2 diabetes mellitus without complications: Secondary | ICD-10-CM | POA: Diagnosis not present

## 2021-08-18 DIAGNOSIS — E7800 Pure hypercholesterolemia, unspecified: Secondary | ICD-10-CM

## 2021-08-18 MED ORDER — EZETIMIBE 10 MG PO TABS: 10.0000 mg | ORAL_TABLET | Freq: Every day | ORAL | 1 refills | Status: DC

## 2021-08-18 MED ORDER — LOSARTAN POTASSIUM 50 MG PO TABS: 50.0000 mg | ORAL_TABLET | Freq: Every day | ORAL | 1 refills | Status: DC

## 2021-08-18 MED ORDER — HYDROCHLOROTHIAZIDE 25 MG PO TABS
25.0000 mg | ORAL_TABLET | Freq: Every day | ORAL | 1 refills | Status: DC
Start: 2021-08-18 — End: 2022-04-26

## 2021-08-18 MED ORDER — METFORMIN HCL ER (OSM) 500 MG PO TB24: 1000.0000 mg | ORAL_TABLET | Freq: Every day | ORAL | 1 refills | Status: DC

## 2021-08-18 NOTE — Assessment & Plan Note (Signed)
Chronic.  Controlled.  Continue with current medication regimen on Hydrochlorothiazide and Losartan 50mg .  Refills sent today.  Labs ordered today.  Return to clinic in 6 months for reevaluation.  Call sooner if concerns arise.

## 2021-08-18 NOTE — Assessment & Plan Note (Signed)
Chronic.  Controlled.  Continue with current medication regimen on Metformin 500mg  BID.  Refills snet today.  Labs ordered today.  Return to clinic in 6 months for reevaluation.  Call sooner if concerns arise.

## 2021-08-18 NOTE — Assessment & Plan Note (Signed)
Chronic.  Controlled.  Continue with current medication regimen on Zetia 10mg.  Refill sent today.  Labs ordered today.  Return to clinic in 6 months for reevaluation.  Call sooner if concerns arise.   

## 2021-08-19 LAB — CBC WITH DIFFERENTIAL/PLATELET
Basophils Absolute: 0.1 10*3/uL (ref 0.0–0.2)
Basos: 1 %
EOS (ABSOLUTE): 0.1 10*3/uL (ref 0.0–0.4)
Eos: 1 %
Hematocrit: 43.1 % (ref 34.0–46.6)
Hemoglobin: 14.5 g/dL (ref 11.1–15.9)
Immature Grans (Abs): 0 10*3/uL (ref 0.0–0.1)
Immature Granulocytes: 0 %
Lymphocytes Absolute: 2.6 10*3/uL (ref 0.7–3.1)
Lymphs: 40 %
MCH: 29.9 pg (ref 26.6–33.0)
MCHC: 33.6 g/dL (ref 31.5–35.7)
MCV: 89 fL (ref 79–97)
Monocytes Absolute: 0.4 10*3/uL (ref 0.1–0.9)
Monocytes: 7 %
Neutrophils Absolute: 3.4 10*3/uL (ref 1.4–7.0)
Neutrophils: 51 %
Platelets: 395 10*3/uL (ref 150–450)
RBC: 4.85 x10E6/uL (ref 3.77–5.28)
RDW: 12.7 % (ref 11.7–15.4)
WBC: 6.6 10*3/uL (ref 3.4–10.8)

## 2021-08-19 LAB — COMPREHENSIVE METABOLIC PANEL
ALT: 31 IU/L (ref 0–32)
AST: 21 IU/L (ref 0–40)
Albumin/Globulin Ratio: 1.8 (ref 1.2–2.2)
Albumin: 4.8 g/dL (ref 3.8–4.8)
Alkaline Phosphatase: 110 IU/L (ref 44–121)
BUN/Creatinine Ratio: 22 (ref 12–28)
BUN: 13 mg/dL (ref 8–27)
Bilirubin Total: 0.4 mg/dL (ref 0.0–1.2)
CO2: 29 mmol/L (ref 20–29)
Calcium: 10 mg/dL (ref 8.7–10.3)
Chloride: 99 mmol/L (ref 96–106)
Creatinine, Ser: 0.6 mg/dL (ref 0.57–1.00)
Globulin, Total: 2.7 g/dL (ref 1.5–4.5)
Glucose: 90 mg/dL (ref 70–99)
Potassium: 4.3 mmol/L (ref 3.5–5.2)
Sodium: 142 mmol/L (ref 134–144)
Total Protein: 7.5 g/dL (ref 6.0–8.5)
eGFR: 101 mL/min/{1.73_m2} (ref >59)

## 2021-08-19 LAB — HEMOGLOBIN A1C
Est. average glucose Bld gHb Est-mCnc: 140 mg/dL
Hgb A1c MFr Bld: 6.5 % — ABNORMAL HIGH (ref 4.8–5.6)

## 2021-08-19 LAB — TSH: TSH: 2.09 u[IU]/mL (ref 0.450–4.500)

## 2021-08-19 LAB — LIPID PANEL
Chol/HDL Ratio: 4.6 ratio — ABNORMAL HIGH (ref 0.0–4.4)
Cholesterol, Total: 226 mg/dL — ABNORMAL HIGH (ref 100–199)
HDL: 49 mg/dL (ref >39)
LDL Chol Calc (NIH): 126 mg/dL — ABNORMAL HIGH (ref 0–99)
Triglycerides: 289 mg/dL — ABNORMAL HIGH (ref 0–149)
VLDL Cholesterol Cal: 51 mg/dL — ABNORMAL HIGH (ref 5–40)

## 2021-08-21 NOTE — Progress Notes (Signed)
Hi Nakeita. It was great to see you last week.  Your lab work shows that your A1c is looking great at 6.5.  Keep up the good work.  Your cholesterol is still elevated.  Recommend a low fat diet.  Other lab work looks good.  Follow up as discussed.

## 2021-08-21 NOTE — Progress Notes (Signed)
Hi Debra [name]. It was great to see you last week.  Your lab work shows that your A1c is looking great at 6.5.  Keep up the good work.  Your cholesterol is still elevated.  Recommend a low fat diet.  Other lab work looks good.  Follow up as discussed.

## 2021-08-22 ENCOUNTER — Telehealth: Payer: Self-pay | Admitting: Nurse Practitioner

## 2021-08-22 LAB — MICROSCOPIC EXAMINATION
Bacteria, UA: NONE SEEN
RBC, Urine: NONE SEEN /hpf (ref 0–2)

## 2021-08-22 LAB — URINALYSIS, ROUTINE W REFLEX MICROSCOPIC
Bilirubin, UA: NEGATIVE
Glucose, UA: NEGATIVE
Ketones, UA: NEGATIVE
Nitrite, UA: NEGATIVE
Protein,UA: NEGATIVE
RBC, UA: NEGATIVE
Specific Gravity, UA: 1.025 (ref 1.005–1.030)
Urobilinogen, Ur: 0.2 mg/dL (ref 0.2–1.0)
pH, UA: 5.5 (ref 5.0–7.5)

## 2021-08-22 MED ORDER — METFORMIN HCL 500 MG PO TABS: 1000.0000 mg | ORAL_TABLET | Freq: Every day | ORAL | 1 refills | Status: DC

## 2021-08-22 NOTE — Telephone Encounter (Signed)
Medication sent to the pharmacy.

## 2021-08-22 NOTE — Telephone Encounter (Signed)
Can the RX be changed to regular metformin instead of the metformin (OSM)?

## 2021-08-22 NOTE — Progress Notes (Signed)
Hi Arnesha.  Your urinalysis from last week looks good.  Please let me know if you have any questions.

## 2021-08-22 NOTE — Telephone Encounter (Signed)
Pt called stating that she went to the pharmacy to pick up the medication, metformin, and they state that the insurance will not cover this medication. Pt is requesting to have PA started so that she can receive this medication.       [id number] - [place] [address], [place] [address]  [address] [place] [address]  Phone: [phone] Fax: [phone]  Hours: Not open 24 hours

## 2021-08-24 ENCOUNTER — Telehealth: Payer: Self-pay | Admitting: Cardiology

## 2021-08-24 NOTE — Telephone Encounter (Signed)
Called patient back and informed her that her lipid was elevated on 08/18/21, her PCP recommended making dietary changes. Patient had a lipid lab scheduled with [place] for 09/13/21, I rescheduled her to come in the week before her follow up on 11/10/20. She has a recall entered for the lab and will be called closer to that date to be scheduled. Patient verbalized understanding and agreed with plan.

## 2021-08-24 NOTE — Telephone Encounter (Signed)
Patient is asking if lab appt is still needed as she just had blood work done with PCP, please assist

## 2021-09-13 ENCOUNTER — Other Ambulatory Visit: Payer: 59

## 2021-09-18 ENCOUNTER — Telehealth: Payer: Self-pay | Admitting: Cardiology

## 2021-09-18 NOTE — Telephone Encounter (Signed)
Noted  

## 2021-09-18 NOTE — Telephone Encounter (Signed)
Patient calling with updated insurance information  [employer] ID [id number] Group ID [id number] Provider phone number [phone] Please call if any additional information needed

## 2021-09-20 NOTE — Telephone Encounter (Signed)
Patient new insurance below may cover different medication [facility] [employer] did not   Per [facility] note in October :    "Insurance did not approve PCSK9's or Vascepa.  Currently taking Zetia.  Plans to change insurance company in about 2 months.  Hoping medications will be approved this time.  She tolerates Zetia, feels well, blood pressures well controlled."

## 2021-09-27 ENCOUNTER — Telehealth: Payer: Self-pay

## 2021-09-27 DIAGNOSIS — E7800 Pure hypercholesterolemia, unspecified: Secondary | ICD-10-CM

## 2021-09-27 NOTE — Telephone Encounter (Signed)
Incoming call from patient.   Patient would like to know if we can change [facility]   She stated she will need an earlier appt than 8am and would [facility] since [facility] can accommodate an earlier time.

## 2021-09-27 NOTE — Telephone Encounter (Signed)
Patient calling for update on if new insurance covers meds.

## 2021-09-29 ENCOUNTER — Telehealth: Payer: Self-pay

## 2021-09-29 NOTE — Addendum Note (Signed)
Addended by: Kavin Leech on: 09/29/2021 11:13 AM   Modules accepted: Orders

## 2021-09-29 NOTE — Telephone Encounter (Signed)
Patient called back and informed me that her insurance would cover the generic for Vascepa with a prior authorization stating the intolerance to statins. Repatha would cost her $128/Month. I informed her that I would let Dr. Garen Lah know and get back to her next week. Patient was grateful for the follow up.

## 2021-09-29 NOTE — Telephone Encounter (Signed)
Called patient back and informed her that she would need to call her insurance and ask about the medication coverage. She was driving in the car and did not have a pen. I informed her that I would send her the names of the drugs to ask about through he MyChart. Patient was agreeable to the plan.

## 2021-09-29 NOTE — Telephone Encounter (Signed)
Also sent in Lipid panel order to be drawn at [facility] prior to her appointment in March 2023.

## 2021-10-03 MED ORDER — ICOSAPENT ETHYL 1 G PO CAPS: 2.0000 g | ORAL_CAPSULE | Freq: Two times a day (BID) | ORAL | 5 refills | Status: DC

## 2021-10-03 NOTE — Telephone Encounter (Signed)
Spoke with Dr. Garen Lah and he recommended that we try Vascepa 2G twice a daily. I called patient and informed her of this and sent prescription in.

## 2021-10-03 NOTE — Telephone Encounter (Signed)
Patient calling with status of prescription

## 2021-10-03 NOTE — Addendum Note (Signed)
Addended by: Kavin Leech on: 10/03/2021 10:31 AM   Modules accepted: Orders

## 2021-10-04 ENCOUNTER — Telehealth: Payer: Self-pay

## 2021-10-04 NOTE — Telephone Encounter (Signed)
Patient states she has new insurance through [employer].   Prescription coverage is through [facility]. ID: [id number] RxBIN: [id number] Rxgrp: [facility] PCN: [id number]  Spoke with Daria Pastures. @ [facility] to request that the first PA [id number] be cancelled because one of the questions was answered incorrectly. Advised her that a new PA was created ([id number]) and has been submitted for review.  She states she will update this and process the new one.

## 2021-10-04 NOTE — Telephone Encounter (Signed)
Prior authorization for icosapent ethyl 1g capsules initiated in [email or web address]. KEY: [id number]  RESPONSE: [facility] is reviewing your PA request. Typically an electronic response will be received within 24-72 hours. To check for an update later, open this request from your dashboard.

## 2021-10-05 NOTE — Telephone Encounter (Signed)
Patient calling States she spoke with the pharmacist and the copay is over $300 Patient will not be paying that Please call to discuss

## 2021-10-05 NOTE — Telephone Encounter (Signed)
Per [facility], Your request for Icosapent Cap 1gm has been approved.  Spoke with patient to inform her of approval.

## 2021-10-06 NOTE — Telephone Encounter (Signed)
Prior Auth did go through [place] but copay still high.

## 2021-10-06 NOTE — Telephone Encounter (Signed)
Patient calling States the prior authorization should be faxed to [facility] at [phone]

## 2021-10-11 MED ORDER — ICOSAPENT ETHYL 1 G PO CAPS: 2.0000 g | ORAL_CAPSULE | Freq: Two times a day (BID) | ORAL | 1 refills | Status: DC

## 2021-10-11 NOTE — Telephone Encounter (Signed)
Spoke with patient and she informed me that [employer] will cover the medication if we send in a 90 day supply through them. Sending prescription in.

## 2021-10-11 NOTE — Addendum Note (Signed)
Addended by: Kavin Leech on: 10/11/2021 11:59 AM   Modules accepted: Orders

## 2021-10-16 NOTE — Telephone Encounter (Addendum)
If she has eaten other seafood products since then like fish and has not had allergic reaction, would be ok to try Vascepa. She should be on the lookout for any signs of similar allergic reaction to previous though and make sure her epipen prescription is up to date. Vascepa is derived from [place] fish (primarily anchovies) rather than shrimp that she's had an issue with in the past though.

## 2021-10-16 NOTE — Telephone Encounter (Signed)
Patient stated that she had some shrimp in the past that caused her face, eyes, and ears to swell. She had gone to the ER and they gave her an EPI pen. She stated that she is not sure if it is dependant on where the shrimp came from, because her daughter has made shrimp since then that she ate and did not have any reaction with.

## 2021-10-16 NOTE — Telephone Encounter (Signed)
Patient calling received Vascepa, instruction states to notify Dr. if allergic to Shellfish, please advise & assist with instructions

## 2021-10-16 NOTE — Telephone Encounter (Signed)
Would specify her allergy to shellfish, we have swelling listed on her allergy list but need details regarding location of swelling, any breathing issues, etc.

## 2021-10-18 NOTE — Telephone Encounter (Signed)
Called patient and discussed the recommendation from pharmacy as documented below. Patient is going to try starting Vascepa and let us know if she has any issues with it.

## 2021-10-23 ENCOUNTER — Other Ambulatory Visit: Payer: Self-pay | Admitting: Nurse Practitioner

## 2021-10-23 NOTE — Telephone Encounter (Signed)
Requested Prescriptions  Pending Prescriptions Disp Refills   omeprazole (PRILOSEC) 20 MG capsule [Pharmacy Med Name: OMEPRAZOLE 20MG  CAPSULES] 90 capsule 0    Sig: TAKE 1 CAPSULE(20 MG) BY MOUTH DAILY     Gastroenterology: Proton Pump Inhibitors Passed - 10/23/2021  6:36 AM      Passed - Valid encounter within last 12 months    Recent Outpatient Visits          2 months ago Annual physical exam   [facility] Jon Billings, NP   5 months ago Knuckle pads   [place], Santiago Glad, NP   7 months ago Acute non-recurrent maxillary sinusitis   [facility] McElwee, Lauren A, NP   7 months ago Upper respiratory tract infection, unspecified type   [facility] Jon Billings, NP   8 months ago Controlled type 2 diabetes mellitus without complication, without long-term current use of insulin ([place])   [facility] Jon Billings, NP      Future Appointments            In 1 month Agbor-Etang, Aaron Edelman, MD [facility], [place]

## 2021-11-10 ENCOUNTER — Ambulatory Visit: Payer: 59 | Admitting: Cardiology

## 2021-12-01 ENCOUNTER — Other Ambulatory Visit (INDEPENDENT_AMBULATORY_CARE_PROVIDER_SITE_OTHER): Payer: 59

## 2021-12-01 ENCOUNTER — Other Ambulatory Visit: Payer: Self-pay

## 2021-12-01 DIAGNOSIS — E7800 Pure hypercholesterolemia, unspecified: Secondary | ICD-10-CM | POA: Diagnosis not present

## 2021-12-02 LAB — LIPID PANEL
Chol/HDL Ratio: 4.3 ratio (ref 0.0–4.4)
Cholesterol, Total: 211 mg/dL — ABNORMAL HIGH (ref 100–199)
HDL: 49 mg/dL (ref >39)
LDL Chol Calc (NIH): 126 mg/dL — ABNORMAL HIGH (ref 0–99)
Triglycerides: 204 mg/dL — ABNORMAL HIGH (ref 0–149)
VLDL Cholesterol Cal: 36 mg/dL (ref 5–40)

## 2021-12-06 ENCOUNTER — Telehealth: Payer: Self-pay

## 2021-12-06 NOTE — Telephone Encounter (Signed)
-----   Message from Kate Sable, MD sent at 12/05/2021  2:28 PM EST ----- ?Cholesterol still elevated. Patient is intolerant to statins. Start PCSK9 if insurance approves.  ?

## 2021-12-06 NOTE — Telephone Encounter (Signed)
Called patient and left a detailed VM per DPR. Requested a call back to discuss the PCSK9. ?

## 2021-12-08 ENCOUNTER — Ambulatory Visit: Payer: 59 | Admitting: Cardiology

## 2021-12-08 ENCOUNTER — Encounter: Payer: Self-pay | Admitting: Cardiology

## 2021-12-08 ENCOUNTER — Other Ambulatory Visit: Payer: Self-pay

## 2021-12-08 VITALS — BP 130/74 | Ht 65.0 in | Wt 178.0 lb

## 2021-12-08 DIAGNOSIS — I10 Essential (primary) hypertension: Secondary | ICD-10-CM | POA: Diagnosis not present

## 2021-12-08 DIAGNOSIS — E782 Mixed hyperlipidemia: Secondary | ICD-10-CM

## 2021-12-08 MED ORDER — PRALUENT 150 MG/ML ~~LOC~~ SOAJ: 1.0000 "pen " | SUBCUTANEOUS | 1 refills | Status: DC

## 2021-12-08 NOTE — Patient Instructions (Signed)
Medication Instructions:  ? ?START taking Praluent 150 MG/ML sub cutaneous every 14 days. ? ?*If you need a refill on your cardiac medications before your next appointment, please call your pharmacy* ? ? ?Lab Work: ? ?Your physician recommends that you return for a FASTING lipid profile: In 3 months prior to your follow up appointment ? ?- You will need to be fasting. Please do not have anything to eat or drink after midnight the morning you have the lab work. You may only have water or black coffee with no cream or sugar.  ? ?We will call you closer to your appointment time and schedule you in our office for this lab draw. ? ? ? ?Testing/Procedures: ?None ordered ? ? ?Follow-Up: ?At [facility], you and your health needs are our priority.  As part of our continuing mission to provide you with exceptional heart care, we have created designated Provider Care Teams.  These Care Teams include your primary Cardiologist (physician) and Advanced Practice Providers (APPs -  Physician Assistants and Nurse Practitioners) who all work together to provide you with the care you need, when you need it. ? ?We recommend signing up for the patient portal called "MyChart".  Sign up information is provided on this After Visit Summary.  MyChart is used to connect with patients for Virtual Visits (Telemedicine).  Patients are able to view lab/test results, encounter notes, upcoming appointments, etc.  Non-urgent messages can be sent to your provider as well.   ?To learn more about what you can do with MyChart, go to [email or web address].   ? ?Your next appointment:   ?3 month(s) ? ?The format for your next appointment:   ?In Person ? ?Provider:   ?Kate Sable, MD  ? ? ?Other Instructions ? ? ?

## 2021-12-08 NOTE — Progress Notes (Signed)
Cardiology Office Note:    Date:  12/08/2021   ID:  Debra [name], DOB 10-02-1956, MRN [id number]  PCP:  Jon Billings, NP  [facility] Cardiologist:  Kate Sable, MD  [place] Electrophysiologist:  None   Referring MD: Jon Billings, NP   Chief Complaint  Patient presents with   OTher    3 month follow up -- patient c.o swelling -- thinks it is from the [place]. Meds reviewed verbally with patient.     History of Present Illness:    Debra [name] is a 66 y.o. female with a hx of hypertension, hyperlipidemia who presents for follow-up.   Patient being seen for hyperlipidemia and hypertension.  She is intolerant to statins, fenofibrate's.  Insurance previously did not approve PCSK9's .  She now has a new insurance, Dalene Seltzer was approved, has been taking for about a month, states having some swelling which she thinks is from [place].  Also takes Zetia.  Repeat cholesterol levels were obtained.  Blood pressures are adequately controlled on current meds.  Prior notes Echocardiogram 04/4080 normal systolic and diastolic function, EF 60 to 65% Coronary CTA 06/2020 normal, no evidence of CAD. She states all of her brothers and mother have history of high cholesterol.  Past Medical History:  Diagnosis Date   Allergies    Borderline diabetes 03/30/2015   GERD (gastroesophageal reflux disease)    Hyperlipidemia    Hypertension    Lumbar back pain    Pre-diabetes    during pregnancy     Past Surgical History:  Procedure Laterality Date   CARPAL TUNNEL RELEASE Right    COLONOSCOPY WITH PROPOFOL N/A 12/30/2020   Procedure: COLONOSCOPY WITH PROPOFOL;  Surgeon: Lucilla Lame, MD;  Location: [place];  Service: Endoscopy;  Laterality: N/A;   LUMBAR LAMINECTOMY/DECOMPRESSION MICRODISCECTOMY Left 10/16/2017   Procedure: Microlumbar decompression L4-5, L5-S1 left;  Surgeon: Susa Day, MD;  Location: [facility];  Service: Orthopedics;  Laterality: Left;  120  mins   PLANTAR FASCIA RELEASE Left    POLYPECTOMY  12/30/2020   Procedure: POLYPECTOMY;  Surgeon: Lucilla Lame, MD;  Location: [place];  Service: Endoscopy;;   RHINOPLASTY  1988   SHOULDER SURGERY Right 2011   TEMPOROMANDIBULAR JOINT SURGERY  1990   TOTAL ABDOMINAL HYSTERECTOMY  1998    Current Medications: Current Meds  Medication Sig   Alirocumab (PRALUENT) 150 MG/ML SOAJ Inject 1 pen into the skin every 14 (fourteen) days.   Biotin 1000 MCG CHEW Chew 1 each by mouth daily.   Cholecalciferol (VITAMIN D3 PO) Take 1,500 Units by mouth daily.   EPINEPHrine 0.3 mg/0.3 mL IJ SOAJ injection INJECT 1 PEN IN THIGH MUSCLE ONE TIME FOR ALLERGIC REACTION. MAY REPEAT IN 5 TO 15 MINUTES IF NEEDED   ezetimibe (ZETIA) 10 MG tablet Take 1 tablet (10 mg total) by mouth daily.   fexofenadine (ALLEGRA) 180 MG tablet Take 180 mg by mouth daily.   hydrochlorothiazide (HYDRODIURIL) 25 MG tablet Take 1 tablet (25 mg total) by mouth daily.   icosapent Ethyl (VASCEPA) 1 g capsule Take 2 capsules (2 g total) by mouth 2 (two) times daily.   losartan (COZAAR) 50 MG tablet Take 1 tablet (50 mg total) by mouth daily.   Melatonin 10 MG TABS Take 1 tablet by mouth at bedtime.    metFORMIN (GLUCOPHAGE) 500 MG tablet Take 2 tablets (1,000 mg total) by mouth daily with breakfast.   omeprazole (PRILOSEC) 20 MG capsule TAKE 1 CAPSULE(20 MG) BY  MOUTH DAILY     Allergies:   Fenofibrate, Latex, Nexlizet [bempedoic acid-ezetimibe], Other, Pravastatin sodium, and Shellfish allergy   Social History   Socioeconomic History   Marital status: Married    Spouse name: Not on file   Number of children: Not on file   Years of education: Not on file   Highest education level: Not on file  Occupational History   Not on file  Tobacco Use   Smoking status: Former    Types: Cigarettes   Smokeless tobacco: Never   Tobacco comments:    33 years quit   Vaping Use   Vaping Use: Never used  Substance and Sexual  Activity   Alcohol use: Yes    Alcohol/week: 0.0 standard drinks    Comment: OCCASIONALLY   Drug use: No   Sexual activity: Not Currently  Other Topics Concern   Not on file  Social History Narrative   Not on file   Social Determinants of Health   Financial Resource Strain: Not on file  Food Insecurity: Not on file  Transportation Needs: Not on file  Physical Activity: Not on file  Stress: Not on file  Social Connections: Not on file     Family History: The patient's family history includes Alcohol abuse in her mother; Breast cancer (age of onset: 71) in her mother; Hyperlipidemia in her brother, brother, and sister; Hypertension in her son; Obesity in her son. She was adopted.  ROS:   Please see the history of present illness.     All other systems reviewed and are negative.  EKGs/Labs/Other Studies Reviewed:    The following studies were reviewed today:   EKG:  EKG not ordered today.  Recent Labs: 08/18/2021: ALT 31; BUN 13; Creatinine, Ser 0.60; Hemoglobin 14.5; Platelets 395; Potassium 4.3; Sodium 142; TSH 2.090  Recent Lipid Panel    Component Value Date/Time   CHOL 211 (H) 12/01/2021 0640   TRIG 204 (H) 12/01/2021 0640   HDL 49 12/01/2021 0640   CHOLHDL 4.3 12/01/2021 0640   CHOLHDL 4.7 10/03/2020 0754   VLDL 59 (H) 10/03/2020 0754   LDLCALC 126 (H) 12/01/2021 0640   LDLCALC 171 (H) 08/16/2017 0801    Physical Exam:    VS:  BP 130/74 (BP Location: Left Arm, Patient Position: Sitting, Cuff Size: Normal)    Ht 5\' 5"  (1.651 m)    Wt 178 lb (80.7 kg)    LMP  (LMP Unknown)    BMI 29.62 kg/m     Wt Readings from Last 3 Encounters:  12/08/21 178 lb (80.7 kg)  08/18/21 178 lb 3.2 oz (80.8 kg)  08/04/21 178 lb (80.7 kg)     GEN:  Well nourished, well developed in no acute distress HEENT: Normal NECK: No JVD; No carotid bruits LYMPHATICS: No lymphadenopathy CARDIAC: RRR, no murmurs, rubs, gallops RESPIRATORY:  Clear to auscultation without rales, wheezing  or rhonchi  ABDOMEN: Soft, non-tender, non-distended MUSCULOSKELETAL:  No edema; No deformity  SKIN: Warm and dry NEUROLOGIC:  Alert and oriented x 3 PSYCHIATRIC:  Normal affect   ASSESSMENT:    1. Primary hypertension   2. Mixed hyperlipidemia     PLAN:    In order of problems listed above:  hypertension, BP controlled.  Continue HCTZ and losartan. Patient with history of hyperlipidemia, cholesterol improving but still elevated.  Patient is not tolerant to statins, did not tolerate Nexlizet.  Prior insurance did not approve PCSK9.  Has some edema since starting Vascepa.  Has  new insurance.  Order PCSK9.  Continue Zetia 10 mg daily.  Continue Vascepa for now.     Follow-up in 4 months  Total encounter time 30 minutes  Greater than 50% was spent in counseling and coordination of care with the patient   This note was generated in part or whole with voice recognition software. Voice recognition is usually quite accurate but there are transcription errors that can and very often do occur. I apologize for any typographical errors that were not detected and corrected.  Medication Adjustments/Labs and Tests Ordered: Current medicines are reviewed at length with the patient today.  Concerns regarding medicines are outlined above.  Orders Placed This Encounter  Procedures   EKG 12-Lead    Meds ordered this encounter  Medications   Alirocumab (PRALUENT) 150 MG/ML SOAJ    Sig: Inject 1 pen into the skin every 14 (fourteen) days.    Dispense:  6 mL    Refill:  1     Patient Instructions  Medication Instructions:   START taking Praluent 150 MG/ML sub cutaneous every 14 days.  *If you need a refill on your cardiac medications before your next appointment, please call your pharmacy*   Lab Work:  Your physician recommends that you return for a FASTING lipid profile: In 3 months prior to your follow up appointment  - You will need to be fasting. Please do not have anything to  eat or drink after midnight the morning you have the lab work. You may only have water or black coffee with no cream or sugar.   We will call you closer to your appointment time and schedule you in our office for this lab draw.    Testing/Procedures: None ordered   Follow-Up: At [facility], you and your health needs are our priority.  As part of our continuing mission to provide you with exceptional heart care, we have created designated Provider Care Teams.  These Care Teams include your primary Cardiologist (physician) and Advanced Practice Providers (APPs -  Physician Assistants and Nurse Practitioners) who all work together to provide you with the care you need, when you need it.  We recommend signing up for the patient portal called "MyChart".  Sign up information is provided on this After Visit Summary.  MyChart is used to connect with patients for Virtual Visits (Telemedicine).  Patients are able to view lab/test results, encounter notes, upcoming appointments, etc.  Non-urgent messages can be sent to your provider as well.   To learn more about what you can do with MyChart, go to [email or web address].    Your next appointment:   3 month(s)  The format for your next appointment:   In Person  Provider:   Kate Sable, MD    Other Instructions     Signed, Kate Sable, MD  12/08/2021 5:43 PM    [place]

## 2021-12-11 NOTE — Telephone Encounter (Signed)
Patient was seen in office on 12/08/21 and Praluent was ordered. ?

## 2021-12-12 ENCOUNTER — Other Ambulatory Visit: Payer: Self-pay

## 2021-12-12 ENCOUNTER — Telehealth: Payer: Self-pay

## 2021-12-12 MED ORDER — REPATHA 140 MG/ML ~~LOC~~ SOSY: 1.0000 "pen " | PREFILLED_SYRINGE | SUBCUTANEOUS | 3 refills | Status: DC

## 2021-12-12 MED ORDER — PRALUENT 150 MG/ML ~~LOC~~ SOAJ: 1.0000 "pen " | SUBCUTANEOUS | 1 refills | Status: DC

## 2021-12-12 NOTE — Telephone Encounter (Signed)
PA started through covermymeds ? ? ?[place] Hires Key: [id number] - Rx #: [id number] ?

## 2021-12-12 NOTE — Telephone Encounter (Signed)
Received a fax from [place] that Vilas is not covered and that Repatha is the preferred covered alternative. Changed prescription to [place]. Will send to Refill pool for a  prior auth to be done. ?

## 2021-12-20 ENCOUNTER — Telehealth: Payer: Self-pay | Admitting: Cardiology

## 2021-12-20 MED ORDER — REPATHA 140 MG/ML ~~LOC~~ SOSY: 1.0000 "pen " | PREFILLED_SYRINGE | SUBCUTANEOUS | 3 refills | Status: DC

## 2021-12-20 NOTE — Telephone Encounter (Signed)
Patient states she received a notification yesterday that her Repatha was being mailed. Please call to discuss. She states she thought this medication was not covered.

## 2021-12-20 NOTE — Addendum Note (Signed)
Addended by: Kavin Leech on: 12/20/2021 02:43 PM ? ? Modules accepted: Orders ? ?

## 2021-12-20 NOTE — Telephone Encounter (Signed)
Patient called back and requested that the Repatha be ordered from [place] instead of [facility]. I called [facility] and requested that they stop the Repatha order as requested by the patient. I was informed that it had not been sent out yet and that they would stop the order for her now. I sent in a new prescription to the patients pharmacy of request. ?

## 2022-01-21 ENCOUNTER — Other Ambulatory Visit: Payer: Self-pay | Admitting: Nurse Practitioner

## 2022-01-21 IMAGING — CT CT HEART MORP W/ CTA COR W/ SCORE W/ CA W/CM &/OR W/O CM
1 of 17 series · 2 of 20 positions shown, 3 images · non-contrast
Comparison: None.

Addendum:
CLINICAL DATA: Chestpain

EXAM:
Cardiac/Coronary  CTA
TECHNIQUE: The patient was scanned on a Siemens Somatoform go.Top scanner.

[Series 47: 35-95% multiphase cta coronary 0.60 · axial · 0.32mm/px · z∈[+1789,+1824]mm · 2 of 3393 slices shown, 3 images]
[im 1131/3393  vessel]
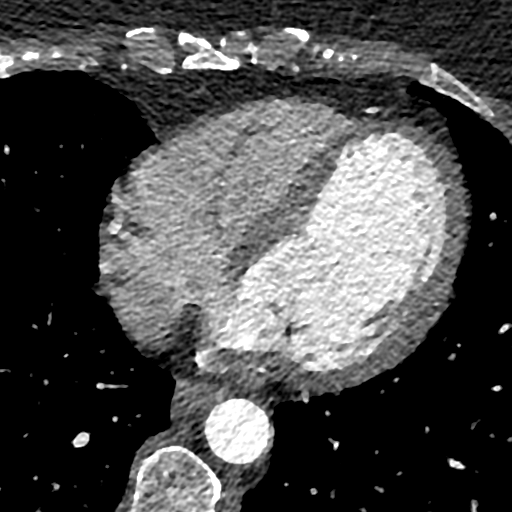
[im 1131/3393  lung]
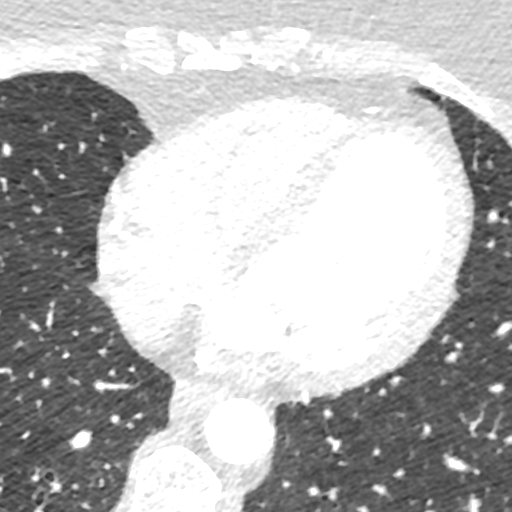
[im 2262/3393  vessel]
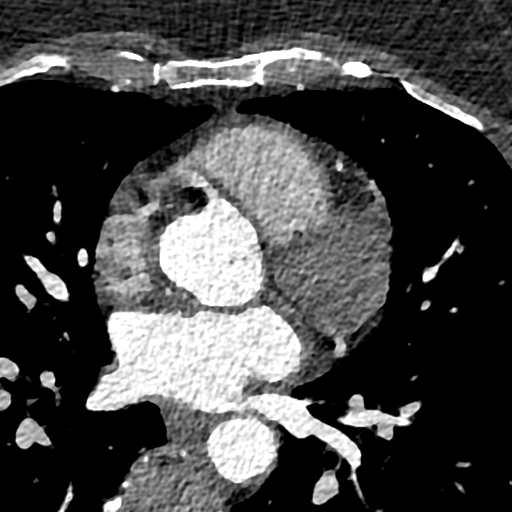

[2 of 20 positions shown; findings below may reference images not displayed]

FINDINGS: A retrospective scan was triggered in the descending thoracic aorta.
Axial non-contrast 3 mm slices were carried out through the heart.
The data set was analyzed on a dedicated work station and scored
using the Agatson method. Gantry rotation speed was 330 msecs and
collimation was .6 mm. 100mg of metoprolol and 0.8 mg of sl NTG was
given. The 3D data set was reconstructed in 5% intervals of the
35-95 % of the R-R cycle. Diastolic phases were analyzed on a
dedicated work station using MPR, MIP and VRT modes. The patient
received 85 cc of contrast.

Aorta:  Normal size.  No calcifications.  No dissection.

Aortic Valve:  Trileaflet.  minimal calcifications.

Coronary Arteries:  Normal coronary origin.  Right dominance.

RCA is a large dominant artery that gives rise to PDA and PLA. There
is no plaque.

Left main is a large artery that gives rise to LAD and LCX arteries.

LAD is a large vessel that has no plaque.

LCX is a non-dominant artery that gives rise to one large OM1 branch
which eventually bifurcates. There is no plaque.

Other findings:

Normal pulmonary vein drainage into the left atrium.

Normal left atrial appendage without a thrombus.

Normal size of the pulmonary artery.
IMPRESSION: 1. Coronary calcium score of 0. Patient is low risk for near term
coronary events

2. Normal coronary origin with right dominance.

3. No evidence of CAD.

4. CAD-RADS 0. Consider non-atherosclerotic causes of chest pain.

EXAM:
OVER-READ INTERPRETATION  CT CHEST

The following report is an over-read performed by radiologist Dr.
does not include interpretation of cardiac or coronary anatomy or
pathology. The coronary calcium score/coronary CTA interpretation by
the cardiologist is attached.
FINDINGS: The visualized portions of the lower lung fields show no suspicious
nodules, masses, or infiltrates. No pleural fluid seen.

The visualized portions of the mediastinum and chest wall are
unremarkable.
IMPRESSION: No significant non-cardiovascular abnormality seen in visualized
portion of the thorax.

*** End of Addendum ***
FINDINGS: A retrospective scan was triggered in the descending thoracic aorta.
Axial non-contrast 3 mm slices were carried out through the heart.
The data set was analyzed on a dedicated work station and scored
using the Agatson method. Gantry rotation speed was 330 msecs and
collimation was .6 mm. 100mg of metoprolol and 0.8 mg of sl NTG was
given. The 3D data set was reconstructed in 5% intervals of the
35-95 % of the R-R cycle. Diastolic phases were analyzed on a
dedicated work station using MPR, MIP and VRT modes. The patient
received 85 cc of contrast.

Aorta:  Normal size.  No calcifications.  No dissection.

Aortic Valve:  Trileaflet.  minimal calcifications.

Coronary Arteries:  Normal coronary origin.  Right dominance.

RCA is a large dominant artery that gives rise to PDA and PLA. There
is no plaque.

Left main is a large artery that gives rise to LAD and LCX arteries.

LAD is a large vessel that has no plaque.

LCX is a non-dominant artery that gives rise to one large OM1 branch
which eventually bifurcates. There is no plaque.

Other findings:

Normal pulmonary vein drainage into the left atrium.

Normal left atrial appendage without a thrombus.

Normal size of the pulmonary artery.
IMPRESSION: 1. Coronary calcium score of 0. Patient is low risk for near term
coronary events

2. Normal coronary origin with right dominance.

3. No evidence of CAD.

4. CAD-RADS 0. Consider non-atherosclerotic causes of chest pain.

## 2022-01-22 NOTE — Telephone Encounter (Signed)
Requested Prescriptions  ?Pending Prescriptions Disp Refills  ?? omeprazole (PRILOSEC) 20 MG capsule [Pharmacy Med Name: OMEPRAZOLE '20MG'$  CAPSULES] 90 capsule 0  ?  Sig: TAKE 1 CAPSULE(20 MG) BY MOUTH DAILY  ?  ? Gastroenterology: Proton Pump Inhibitors Passed - 01/21/2022  6:35 AM  ?  ?  Passed - Valid encounter within last 12 months  ?  Recent Outpatient Visits   ?      ? 5 months ago Annual physical exam  ? [place], NP  ? 8 months ago Knuckle pads  ? [place], NP  ? 10 months ago Acute non-recurrent maxillary sinusitis  ? [place], Lauren A, NP  ? 10 months ago Upper respiratory tract infection, unspecified type  ? [place], NP  ? 11 months ago Controlled type 2 diabetes mellitus without complication, without long-term current use of insulin ([place])  ? [facility] Jon Billings, NP  ?  ?  ?Future Appointments   ?        ? In 1 month Agbor-Etang, Aaron Edelman, MD [facility]  ?  ? ?  ?  ?  ? ? ?

## 2022-01-24 ENCOUNTER — Other Ambulatory Visit: Payer: Self-pay | Admitting: Nurse Practitioner

## 2022-01-24 NOTE — Telephone Encounter (Signed)
Requested Prescriptions  ?Pending Prescriptions Disp Refills  ?? metFORMIN (GLUCOPHAGE) 500 MG tablet [Pharmacy Med Name: metFORMIN HCl 500MG TABS*] 56 tablet   ?  Sig: TAKE 2 TABLETS BY MOUTH WITH BREAKFAST  ?  ? Endocrinology:  Diabetes - Biguanides Failed - 01/24/2022  9:23 AM  ?  ?  Failed - B12 Level in normal range and within 720 days  ?  No results found for: VITAMINB12   ?  ?  Passed - Cr in normal range and within 360 days  ?  Creat  ?Date Value Ref Range Status  ?08/16/2017 0.80 0.50 - 1.05 mg/dL Final  ?  Comment:  ?  For patients >22 years of age, the reference limit ?for Creatinine is approximately 13% higher for people ?identified as African-American. ?. ?  ? ?Creatinine, Ser  ?Date Value Ref Range Status  ?08/18/2021 0.60 0.57 - 1.00 mg/dL Final  ?   ?  ?  Passed - HBA1C is between 0 and 7.9 and within 180 days  ?  Hgb A1c MFr Bld  ?Date Value Ref Range Status  ?08/18/2021 6.5 (H) 4.8 - 5.6 % Final  ?  Comment:  ?           Prediabetes: 5.7 - 6.4 ?         Diabetes: >6.4 ?         Glycemic control for adults with diabetes: <7.0 ?  ?   ?  ?  Passed - eGFR in normal range and within 360 days  ?  GFR, Est African American  ?Date Value Ref Range Status  ?08/16/2017 94 > OR = 60 mL/min/1.56m Final  ? ?GFR calc Af Amer  ?Date Value Ref Range Status  ?05/11/2020 109 >59 mL/min/1.73 Final  ?  Comment:  ?  **[facility] currently reports eGFR in compliance with the current** ?  recommendations of the [employer] [facility] will ?  update reporting as new guidelines are published from the NKF-ASN ?  Task force. ?  ? ?GFR, Est Non African American  ?Date Value Ref Range Status  ?08/16/2017 81 > OR = 60 mL/min/1.744mFinal  ? ?GFR calc non Af Amer  ?Date Value Ref Range Status  ?05/11/2020 94 >59 mL/min/1.73 Final  ? ?eGFR  ?Date Value Ref Range Status  ?08/18/2021 101 >59 mL/min/1.73 Final  ?   ?  ?  Passed - Valid encounter within last 6 months  ?  Recent Outpatient Visits   ?      ? 5 months ago  Annual physical exam  ? [place]  ? 8 months ago Knuckle pads  ? [place]  ? 10 months ago Acute non-recurrent maxillary sinusitis  ? [place]rie[place]ren A, NP  ? 10 months ago Upper respiratory tract infection, unspecified type  ? [place]  ? 11 months ago Controlled type 2 diabetes mellitus without complication, without long-term current use of insulin ([place] ? [facility]  ?  ?  ?Future Appointments   ?        ? In 1 month Agbor-Etang, BrAaron EdelmanMD [facility]  ?  ? ?  ?  ?  Passed - CBC within normal limits and completed in the last 12 months  ?  WBC  ?Date Value Ref Range Status  ?08/18/2021 6.6 3.4 - 10.8 x10E3/uL Final  ?10/11/2017 8.0 4.0 -  10.5 K/uL Final  ? ?RBC  ?Date Value Ref Range Status  ?08/18/2021 4.85 3.77 - 5.28 x10E6/uL Final  ?10/11/2017 4.71 3.87 - 5.11 MIL/uL Final  ? ?Hemoglobin  ?Date Value Ref Range Status  ?08/18/2021 14.5 11.1 - 15.9 g/dL Final  ? ?Hematocrit  ?Date Value Ref Range Status  ?08/18/2021 43.1 34.0 - 46.6 % Final  ? ?MCHC  ?Date Value Ref Range Status  ?08/18/2021 33.6 31.5 - 35.7 g/dL Final  ?10/11/2017 34.3 30.0 - 36.0 g/dL Final  ? ?MCH  ?Date Value Ref Range Status  ?08/18/2021 29.9 26.6 - 33.0 pg Final  ?10/11/2017 30.6 26.0 - 34.0 pg Final  ? ?MCV  ?Date Value Ref Range Status  ?08/18/2021 89 79 - 97 fL Final  ? ?No results found for: PLTCOUNTKUC, LABPLAT, [place] ?RDW  ?Date Value Ref Range Status  ?08/18/2021 12.7 11.7 - 15.4 % Final  ? ?  ?  ?  ? ?

## 2022-01-29 ENCOUNTER — Encounter: Payer: Self-pay | Admitting: Nurse Practitioner

## 2022-01-29 ENCOUNTER — Ambulatory Visit: Payer: Self-pay

## 2022-01-29 ENCOUNTER — Ambulatory Visit: Payer: 59 | Admitting: Nurse Practitioner

## 2022-01-29 VITALS — BP 106/73 | HR 86 | Temp 97.9°F | Ht 65.0 in | Wt 182.6 lb

## 2022-01-29 DIAGNOSIS — H6501 Acute serous otitis media, right ear: Secondary | ICD-10-CM | POA: Diagnosis not present

## 2022-01-29 MED ORDER — FLUCONAZOLE 150 MG PO TABS: 150.0000 mg | ORAL_TABLET | Freq: Once | ORAL | 0 refills | Status: AC

## 2022-01-29 MED ORDER — AMOXICILLIN-POT CLAVULANATE 875-125 MG PO TABS: 1.0000 | ORAL_TABLET | Freq: Two times a day (BID) | ORAL | 0 refills | Status: AC

## 2022-01-29 NOTE — Telephone Encounter (Signed)
?  Chief Complaint: ear pain ?Symptoms: R ear pain, sharp pain comes and goes 10/10, bloody nasal drainage ?Frequency: today ?Pertinent Negatives: NA ?Disposition: '[]'$ ED /'[]'$ Urgent Care (no appt availability in office) / '[x]'$ Appointment(In office/virtual)/ '[]'$  [facility]/ '[]'$ Home Care/ '[]'$ Refused Recommended Disposition /'[]'$ [place] [facility]/ '[]'$  Follow-up with PCP ?Additional Notes:  ? ? ?Reason for Disposition ? [1] SEVERE pain AND [2] not improved 2 hours after taking analgesic medication (e.g., ibuprofen or acetaminophen) ? ?Answer Assessment - Initial Assessment Questions ?1. LOCATION: "Which ear is involved?" ?    R ear  ?2. ONSET: "When did the ear start hurting"  ?    today ?3. SEVERITY: "How bad is the pain?"  (Scale 1-10; mild, moderate or severe) ?  - MILD (1-3): doesn't interfere with normal activities  ?  - MODERATE (4-7): interferes with normal activities or awakens from sleep  ?  - SEVERE (8-10): excruciating pain, unable to do any normal activities  ?    Sharp pain 10 comes and goes 4x/1 hr ?4. URI SYMPTOMS: "Do you have a runny nose or cough?" ?    yes ?5. FEVER: "Do you have a fever?" If Yes, ask: "What is your temperature, how was it measured, and when did it start?" ?    Feels like it ?7. OTHER SYMPTOMS: "Do you have any other symptoms?" (e.g., headache, stiff neck, dizziness, vomiting, runny nose, decreased hearing) ?    Sore to touch ? ?Protocols used: Earache-A-AH ? ?

## 2022-01-29 NOTE — Patient Instructions (Signed)
Otitis Media, Adult  Otitis media is a condition in which the middle ear is red and swollen (inflamed) and full of fluid. The middle ear is the part of the ear that contains bones for hearing as well as air that helps send sounds to the brain. The condition usually goes away on its own. What are the causes? This condition is caused by a blockage in the eustachian tube. This tube connects the middle ear to the back of the nose. It normally allows air into the middle ear. The blockage is caused by fluid or swelling. Problems that can cause blockage include: A cold or infection that affects the nose, mouth, or throat. Allergies. An irritant, such as tobacco smoke. Adenoids that have become large. The adenoids are soft tissue located in the back of the throat, behind the nose and the roof of the mouth. Growth or swelling in the upper part of the throat, just behind the nose (nasopharynx). Damage to the ear caused by a change in pressure. This is called barotrauma. What increases the risk? You are more likely to develop this condition if you: Smoke or are exposed to tobacco smoke. Have an opening in the roof of your mouth (cleft palate). Have acid reflux. Have problems in your body's defense system (immune system). What are the signs or symptoms? Symptoms of this condition include: Ear pain. Fever. Problems with hearing. Being tired. Fluid leaking from the ear. Ringing in the ear. How is this treated? This condition can go away on its own within 3-5 days. But if the condition is caused by germs (bacteria) and does not go away on its own, or if it keeps coming back, your doctor may: Give you antibiotic medicines. Give you medicines for pain. Follow these instructions at home: Take over-the-counter and prescription medicines only as told by your doctor. If you were prescribed an antibiotic medicine, take it as told by your doctor. Do not stop taking it even if you start to feel better. Keep  all follow-up visits. Contact a doctor if: You have bleeding from your nose. There is a lump on your neck. You are not feeling better in 5 days. You feel worse instead of better. Get help right away if: You have pain that is not helped with medicine. You have swelling, redness, or pain around your ear. You get a stiff neck. You cannot move part of your face (paralysis). You notice that the bone behind your ear hurts when you touch it. You get a very bad headache. Summary Otitis media means that the middle ear is red, swollen, and full of fluid. This condition usually goes away on its own. If the problem does not go away, treatment may be needed. You may be given medicines to treat the infection or to treat your pain. If you were prescribed an antibiotic medicine, take it as told by your doctor. Do not stop taking it even if you start to feel better. Keep all follow-up visits. This information is not intended to replace advice given to you by your health care provider. Make sure you discuss any questions you have with your health care provider. Document Revised: 01/02/2021 Document Reviewed: 01/02/2021 Elsevier Patient Education  2023 Elsevier Inc.  

## 2022-01-29 NOTE — Progress Notes (Signed)
? ?BP 106/73   Pulse 86   Temp 97.9 ?F (36.6 ?C) (Oral)   Ht '5\' 5"'$  (1.651 m)   Wt 182 lb 9.6 oz (82.8 kg)   LMP  (LMP Unknown)   SpO2 96%   BMI 30.39 kg/m?   ? ?Subjective:  ? ? Patient ID: Debra [name], female    DOB: [birth year], 66 y.o.   MRN: [id number] ? ?HPI: ?Debra [name] is a 66 y.o. female ? ?Chief Complaint  ?Patient presents with  ? Ear Pain  ?  Patient is here R ear pain. Patient states she is for sure getting an ear infection. Patient states she is experiencing sharp pains in her R ear and she blew her nose earlier and she had a little bit of blood come out. Patient states when she usually has this pain and discomfort, she had fluid on her ears.   ? ?EAR PAIN ?To right side, for past two weeks both ears have been stopped up.  Does have underlying allergies: Allegra, Flonase. ?Duration: days ?Involved ear(s): right ?Severity:  10/10 at worst ?Quality:  sharp, aching, and throbbing ?Fever:  felt hot this afternoon ?Otorrhea: no ?Upper respiratory infection symptoms: no ?Pruritus: a little bit ?Hearing loss: no ?Water immersion no ?Using Q-tips:  rarely, used one last week ?Recurrent otitis media:  has one every year ?Status: fluctuating ?Treatments attempted: none  ? ?Relevant past medical, surgical, family and social history reviewed and updated as indicated. Interim medical history since our last visit reviewed. ?Allergies and medications reviewed and updated. ? ?Review of Systems  ?Constitutional:  Negative for activity change, appetite change, diaphoresis, fatigue and fever.  ?HENT:  Positive for ear pain, postnasal drip and rhinorrhea. Negative for congestion, ear discharge, sinus pressure, sinus pain, sneezing, sore throat and voice change.   ?Respiratory: Negative.    ?Cardiovascular: Negative.   ?Neurological: Negative.   ?Psychiatric/Behavioral: Negative.    ? ?Per HPI unless specifically indicated above ? ?   ?Objective:  ?  ?BP 106/73   Pulse 86   Temp 97.9 ?F (36.6 ?C) (Oral)   Ht  '5\' 5"'$  (1.651 m)   Wt 182 lb 9.6 oz (82.8 kg)   LMP  (LMP Unknown)   SpO2 96%   BMI 30.39 kg/m?   ?Wt Readings from Last 3 Encounters:  ?01/29/22 182 lb 9.6 oz (82.8 kg)  ?12/08/21 178 lb (80.7 kg)  ?08/18/21 178 lb 3.2 oz (80.8 kg)  ?  ?Physical Exam ?Vitals and nursing note reviewed.  ?Constitutional:   ?   General: She is awake. She is not in acute distress. ?   Appearance: She is well-developed and well-groomed. She is obese. She is not ill-appearing or toxic-appearing.  ?HENT:  ?   Head: Normocephalic.  ?   Right Ear: Hearing, ear canal and external ear normal. Tenderness present. No swelling. A middle ear effusion is present. Tympanic membrane is injected.  ?   Left Ear: Hearing, ear canal and external ear normal. No tenderness.  No middle ear effusion. Tympanic membrane is not injected.  ?   Nose: Nose normal.  ?   Right Sinus: No maxillary sinus tenderness or frontal sinus tenderness.  ?   Left Sinus: No maxillary sinus tenderness or frontal sinus tenderness.  ?   Mouth/Throat:  ?   Mouth: Mucous membranes are moist.  ?   Pharynx: Posterior oropharyngeal erythema (with cobblestone pattern) present. No pharyngeal swelling or oropharyngeal exudate.  ?Eyes:  ?   General:  Lids are normal.     ?   Right eye: No discharge.     ?   Left eye: No discharge.  ?   Conjunctiva/sclera: Conjunctivae normal.  ?   Pupils: Pupils are equal, round, and reactive to light.  ?Neck:  ?   Vascular: No carotid bruit.  ?Cardiovascular:  ?   Rate and Rhythm: Normal rate and regular rhythm.  ?   Heart sounds: Normal heart sounds. No murmur heard. ?  No gallop.  ?Pulmonary:  ?   Effort: Pulmonary effort is normal. No accessory muscle usage or respiratory distress.  ?   Breath sounds: Normal breath sounds.  ?Abdominal:  ?   General: Bowel sounds are normal.  ?   Palpations: Abdomen is soft. There is no hepatomegaly or splenomegaly.  ?Musculoskeletal:  ?   Cervical back: Normal range of motion and neck supple.  ?   Right lower leg: No  edema.  ?   Left lower leg: No edema.  ?Lymphadenopathy:  ?   Cervical: No cervical adenopathy.  ?Skin: ?   General: Skin is warm and dry.  ?Neurological:  ?   Mental Status: She is alert and oriented to person, place, and time.  ?Psychiatric:     ?   Attention and Perception: Attention normal.     ?   Mood and Affect: Mood normal.     ?   Speech: Speech normal.     ?   Behavior: Behavior normal. Behavior is cooperative.     ?   Thought Content: Thought content normal.  ? ?Results for orders placed or performed in visit on 12/01/21  ?Lipid panel  ?Result Value Ref Range  ? Cholesterol, Total 211 (H) 100 - 199 mg/dL  ? Triglycerides 204 (H) 0 - 149 mg/dL  ? HDL 49 >39 mg/dL  ? VLDL Cholesterol Cal 36 5 - 40 mg/dL  ? LDL Chol Calc (NIH) 126 (H) 0 - 99 mg/dL  ? Chol/HDL Ratio 4.3 0.0 - 4.4 ratio  ? ?   ?Assessment & Plan:  ? ?Problem List Items Addressed This Visit   ? ?  ? Nervous and Auditory  ? Non-recurrent acute serous otitis media of right ear - Primary  ?  Acute to right ear.  History of similar in past.  Recommend starting Singulair for allergies, as has tried all OTC medications with ongoing ear infections yearly.  She wishes to think about this.  At this time start Augmentin BID for 7 days and send in Fluconazole to take for yeast prevention. Recommend avoid Q-tips.  Return to office in 7 days for recheck. ? ?  ?  ? Relevant Medications  ? amoxicillin-clavulanate (AUGMENTIN) 875-125 MG tablet  ? fluconazole (DIFLUCAN) 150 MG tablet  ?  ? ?Follow up plan: ?Return in about 1 week (around 02/05/2022) for RIGHT EAR CHECK. ? ? ? ? ? ?

## 2022-01-29 NOTE — Assessment & Plan Note (Signed)
Acute to right ear.  History of similar in past.  Recommend starting Singulair for allergies, as has tried all OTC medications with ongoing ear infections yearly.  She wishes to think about this.  At this time start Augmentin BID for 7 days and send in Fluconazole to take for yeast prevention. Recommend avoid Q-tips.  Return to office in 7 days for recheck. ?

## 2022-02-08 ENCOUNTER — Ambulatory Visit: Payer: 59 | Admitting: Nurse Practitioner

## 2022-02-20 ENCOUNTER — Other Ambulatory Visit: Payer: Self-pay | Admitting: Nurse Practitioner

## 2022-02-21 NOTE — Telephone Encounter (Signed)
Requested Prescriptions  ?Pending Prescriptions Disp Refills  ?? ezetimibe (ZETIA) 10 MG tablet [Pharmacy Med Name: Ezetimibe '10MG'$  TABS] 90 tablet 0  ?  Sig: TAKE 1 TABLET BY MOUTH DAILY  ?  ? Cardiovascular:  Antilipid - Sterol Transport Inhibitors Failed - 02/20/2022 10:57 AM  ?  ?  Failed - Lipid Panel in normal range within the last 12 months  ?  Cholesterol, Total  ?Date Value Ref Range Status  ?12/01/2021 211 (H) 100 - 199 mg/dL Final  ? ?LDL Cholesterol (Calc)  ?Date Value Ref Range Status  ?08/16/2017 171 (H) mg/dL (calc) Final  ?  Comment:  ?  Reference range: <100 ?Marland Kitchen ?Desirable range <100 mg/dL for primary prevention;   ?<70 mg/dL for patients with CHD or diabetic patients  ?with > or = 2 CHD risk factors. ?. ?LDL-C is now calculated using the Martin-Hopkins  ?calculation, which is a validated novel method providing  ?better accuracy than the Friedewald equation in the  ?estimation of LDL-C.  ?Cresenciano Genre et al. Annamaria Helling. 5573;220(25): 2061-2068  ?(http://education.[employer].com/faq/FAQ164) ?  ? ?LDL Chol Calc (NIH)  ?Date Value Ref Range Status  ?12/01/2021 126 (H) 0 - 99 mg/dL Final  ? ?HDL  ?Date Value Ref Range Status  ?12/01/2021 49 >39 mg/dL Final  ? ?Triglycerides  ?Date Value Ref Range Status  ?12/01/2021 204 (H) 0 - 149 mg/dL Final  ? ?  ?  ?  Passed - AST in normal range and within 360 days  ?  AST  ?Date Value Ref Range Status  ?08/18/2021 21 0 - 40 IU/L Final  ?   ?  ?  Passed - ALT in normal range and within 360 days  ?  ALT  ?Date Value Ref Range Status  ?08/18/2021 31 0 - 32 IU/L Final  ?   ?  ?  Passed - Patient is not pregnant  ?  ?  Passed - Valid encounter within last 12 months  ?  Recent Outpatient Visits   ?      ? 3 weeks ago Non-recurrent acute serous otitis media of right ear  ? [facility] [place], Henrine Screws T, NP  ? 6 months ago Annual physical exam  ? [place], NP  ? 9 months ago Knuckle pads  ? [place], NP  ? 11 months ago Acute non-recurrent maxillary sinusitis  ? [place], NP  ? 11 months ago Upper respiratory tract infection, unspecified type  ? [facility] Jon Billings, NP  ?  ?  ?Future Appointments   ?        ? In 3 weeks [place], Aaron Edelman, MD [facility]  ?  ? ?  ?  ?  ? ? ?

## 2022-03-13 ENCOUNTER — Telehealth: Payer: Self-pay | Admitting: Cardiology

## 2022-03-13 DIAGNOSIS — E7800 Pure hypercholesterolemia, unspecified: Secondary | ICD-10-CM

## 2022-03-13 MED ORDER — REPATHA 140 MG/ML ~~LOC~~ SOSY: 1.0000 "pen " | PREFILLED_SYRINGE | SUBCUTANEOUS | 3 refills | Status: DC

## 2022-03-13 NOTE — Telephone Encounter (Signed)
Spoke with patient and informed her that after some research we think that her prescription for Repatha has to be filled by [facility] only to be covered by her prescription plan. Prescription was re-sent to [facility].  Patient verbalized understanding and agreed with plan.

## 2022-03-13 NOTE — Telephone Encounter (Signed)
Patient also informed me that she had to cancel her follow up appointment this week. She wanted to know if she could push it back a couple months since she will just be hopefully be starting the [place] now.  I informed her that I would discuss this with Dr. Garen Lah and get back to her. Patient was grateful for the follow up.

## 2022-03-13 NOTE — Telephone Encounter (Signed)
Pt c/o medication issue:  1. Name of Medication: Evolocumab (REPATHA) 140 MG/ML SOSY  2. How are you currently taking this medication (dosage and times per day)? Not currently taking   3. Are you having a reaction (difficulty breathing--STAT)? No   4. What is your medication issue? Patient is calling requesting to speak with Coleen regarding this medication due to never receiving it to begin taking it. She states she is on lunch so if unable to callback within the next 20 mins it would be best to callback after 4:30.

## 2022-03-13 NOTE — Telephone Encounter (Signed)
[facility] and they stated that they needed to do a PA on it. Patient had originally been with [facility] when it was approved and then requested we change it to [facility] back in March of this year. [facility] stated that they will start the PA on it now. Will call the patient after 4:30 as requested.

## 2022-03-16 ENCOUNTER — Ambulatory Visit: Payer: 59 | Admitting: Cardiology

## 2022-03-20 ENCOUNTER — Other Ambulatory Visit: Payer: Self-pay

## 2022-03-20 MED ORDER — ICOSAPENT ETHYL 1 G PO CAPS: 2.0000 g | ORAL_CAPSULE | Freq: Two times a day (BID) | ORAL | 0 refills | Status: DC

## 2022-03-21 ENCOUNTER — Telehealth: Payer: Self-pay

## 2022-03-21 NOTE — Telephone Encounter (Signed)
Prior Authorization required for Repatha '140mg'$ /mL.  KEY: [id number] Response:  Wait for Determination Please wait for [facility] 2017 NCPDP to return a determination.

## 2022-03-22 NOTE — Telephone Encounter (Signed)
This request was denied because you did not meet the following clinical requirements: Based on the information provided, you do not meet the established medication-specific criteria or guidelines for Repatha at this time. The request for coverage for REPATHA INJ '140MG'$ /ML, use as directed (37m per 28 days), is denied. This decision is based on health plan criteria for REPATHA INJ '140MG'$ /ML. This medicine is covered only if: Heterozygous familial hypercholesterolemia as confirmed by one of the following: (a) Both of the following: (i) Pre-treatment low-density lipoprotein cholesterol greater than '190mg'$ /dL (greater than '155mg'$ /dL if less than 128years of age). (ii) One of the following: (AA) Family history of myocardial infarction in first-degree relative less than 681years of age. (BB) Family history of myocardial infarction in second-degree relative less than 522years of age. (CC) Family history of low-density lipoprotein cholesterol greater than '190mg'$ /dL in first- or seconddegree relative. (DD) Family history of heterozygous or homozygous familial hypercholesterolemia in first- or second-degree relative. (EE) Family history of tendinous xanthomata and/or arcus cornealis in first- or second-degree [phone] [employer] trademarks and logos are owned by [employer] brand or product names are trademarks or registered marks of their respective owners.  2022 [employer] reserved. [phone] 2 of 13 relative. (b) Both of the following: (i) Pre-treatment low-density lipoprotein cholesterol greater than '190mg'$ /dL (greater than '155mg'$ /dL if less than 148years of age). (ii) One of the following: (AA) Functional mutation in low-density lipoprotein, apolipoprotein, or proprotein convertase subtilisin/kexin type 9 gene. (BB) Tendinous xanthomata. (CC) Arcus cornealis before age 64 The information provided does not show that you meet the criteria listed above. The  reason(s) [facility] did not approve this medication can be found above. This denial is based on our [place], in addition to any supplementary information you or your prescriber may have submitted.

## 2022-03-23 NOTE — Telephone Encounter (Signed)
"  You have the right to appeal this medication coverage decision within 180 calendar days from the date of this denial notification. If your prescriber submits the appeal on your behalf, be sure to have him or her include your signed authorization, allowing him or her to do so, along with a clear indication that the appeal is being requested on your behalf."  [place] letter has been written- however patient needs to sign letter and indicate that she gives [place] authorization to appeal on her behalf.  Please have patient come in a sign letter. I will e-mail letter to you.

## 2022-03-27 ENCOUNTER — Telehealth: Payer: Self-pay | Admitting: Cardiology

## 2022-03-27 NOTE — Telephone Encounter (Signed)
New Message:    Patient called and said she was supposed to call Coleen, concerning her Repatha

## 2022-03-27 NOTE — Telephone Encounter (Signed)
Returned patients call and left a detailed VM per DPR on file letting her know that we have sent an appeal in for the [place] and it is still processing. Informed her that I would call back on Thursday this week for an update and reach back out to her.   From 03/27/22   Patient called and said she was supposed to call Thurman Sarver, concerning her Repatha

## 2022-03-27 NOTE — Telephone Encounter (Signed)
Duplicate encounter please see tele encounter for PAF on 6/14. Closing encounter and consolidating.

## 2022-03-27 NOTE — Telephone Encounter (Signed)
Appeal letter faxed to insurance company-waiting for determination.

## 2022-03-29 NOTE — Telephone Encounter (Signed)
Patient returning call.

## 2022-03-30 NOTE — Telephone Encounter (Signed)
error 

## 2022-04-03 ENCOUNTER — Telehealth: Payer: Self-pay | Admitting: Cardiology

## 2022-04-05 ENCOUNTER — Other Ambulatory Visit: Payer: Self-pay

## 2022-04-05 NOTE — Telephone Encounter (Signed)
Spoke with patient and she informed me that she did receive her Repatha last week so she was able to start it. We scheduled her follow up appointment for 06/08/22, patient will get a fasting lipid prior to her appointment.

## 2022-04-05 NOTE — Addendum Note (Signed)
Addended by: Kavin Leech on: 04/05/2022 10:17 AM   Modules accepted: Orders

## 2022-04-05 NOTE — Telephone Encounter (Signed)
Patient returned RN's call.  She requested call back between 12pm and 1pm.

## 2022-04-16 ENCOUNTER — Other Ambulatory Visit: Payer: Self-pay | Admitting: Nurse Practitioner

## 2022-04-16 DIAGNOSIS — I10 Essential (primary) hypertension: Secondary | ICD-10-CM

## 2022-04-17 ENCOUNTER — Encounter: Payer: Self-pay | Admitting: *Deleted

## 2022-04-17 ENCOUNTER — Telehealth: Payer: Self-pay

## 2022-04-17 NOTE — Telephone Encounter (Signed)
Voicemail left to schedule appt. She canceled in May and never rescheduled.

## 2022-04-17 NOTE — Telephone Encounter (Signed)
Communicated with pt via MyChart. These were all filled 03/22/22 with a 3 month supply.(auto request from pharm due to rx written 01/20/22.) Requested Prescriptions  Pending Prescriptions Disp Refills  . metFORMIN (GLUCOPHAGE) 500 MG tablet [Pharmacy Med Name: metFORMIN HCl 500MG TABS*] 56 tablet     Sig: TAKE 2 TABLETS BY MOUTH WITH BREAKFAST     Endocrinology:  Diabetes - Biguanides Failed - 04/16/2022  3:10 PM      Failed - HBA1C is between 0 and 7.9 and within 180 days    Hgb A1c MFr Bld  Date Value Ref Range Status  08/18/2021 6.5 (H) 4.8 - 5.6 % Final    Comment:             Prediabetes: 5.7 - 6.4          Diabetes: >6.4          Glycemic control for adults with diabetes: <7.0          Failed - B12 Level in normal range and within 720 days    No results found for: "VITAMINB12"       Passed - Cr in normal range and within 360 days    Creat  Date Value Ref Range Status  08/16/2017 0.80 0.50 - 1.05 mg/dL Final    Comment:    For patients >20 years of age, the reference limit for Creatinine is approximately 13% higher for people identified as African-American. .    Creatinine, Ser  Date Value Ref Range Status  08/18/2021 0.60 0.57 - 1.00 mg/dL Final         Passed - eGFR in normal range and within 360 days    GFR, Est African American  Date Value Ref Range Status  08/16/2017 94 > OR = 60 mL/min/1.27m Final   GFR calc Af Amer  Date Value Ref Range Status  05/11/2020 109 >59 mL/min/1.73 Final    Comment:    **[facility] currently reports eGFR in compliance with the current**   recommendations of the [employer] [facility] will   update reporting as new guidelines are published from the NKF-ASN   Task force.    GFR, Est Non African American  Date Value Ref Range Status  08/16/2017 81 > OR = 60 mL/min/1.772mFinal   GFR calc non Af Amer  Date Value Ref Range Status  05/11/2020 94 >59 mL/min/1.73 Final   eGFR  Date Value Ref Range Status  08/18/2021  101 >59 mL/min/1.73 Final         Passed - Valid encounter within last 6 months    Recent Outpatient Visits          2 months ago Non-recurrent acute serous otitis media of right ear   [facility]   8 months ago Annual physical exam   [facility]   11 months ago Knuckle pads   [facility]   1 year ago Acute non-recurrent maxillary sinusitis   [facility] McElwee, Lauren A, NP   1 year ago Upper respiratory tract infection, unspecified type   [place]      Future Appointments            In 1 month Agbor-Etang, Br[facility]aron EdelmanMD [facility]           Passed - CBC within normal limits and completed in the last 12 months    WBC  Date Value Ref Range Status  08/18/2021 6.6 3.4 - 10.8 x10E3/uL Final  10/11/2017 8.0 4.0 - 10.5 K/uL Final   RBC  Date Value Ref Range Status  08/18/2021 4.85 3.77 - 5.28 x10E6/uL Final  10/11/2017 4.71 3.87 - 5.11 MIL/uL Final   Hemoglobin  Date Value Ref Range Status  08/18/2021 14.5 11.1 - 15.9 g/dL Final   Hematocrit  Date Value Ref Range Status  08/18/2021 43.1 34.0 - 46.6 % Final   MCHC  Date Value Ref Range Status  08/18/2021 33.6 31.5 - 35.7 g/dL Final  10/11/2017 34.3 30.0 - 36.0 g/dL Final   [facility]  Date Value Ref Range Status  08/18/2021 29.9 26.6 - 33.0 pg Final  10/11/2017 30.6 26.0 - 34.0 pg Final   MCV  Date Value Ref Range Status  08/18/2021 89 79 - 97 fL Final   No results found for: "PLTCOUNTKUC", "LABPLAT", "POCPLA" RDW  Date Value Ref Range Status  08/18/2021 12.7 11.7 - 15.4 % Final         . omeprazole (PRILOSEC) 20 MG capsule [Pharmacy Med Name: Omeprazole 20MG CPDR] 28 capsule     Sig: TAKE 1 CAPSULE DAILY     Gastroenterology: Proton Pump Inhibitors Passed - 04/16/2022  3:10 PM      Passed - Valid encounter within last 12 months     Recent Outpatient Visits          2 months ago Non-recurrent acute serous otitis media of right ear   [place], Henrine Screws T, NP   8 months ago Annual physical exam   [place], NP   11 months ago Knuckle pads   [facility] Jon Billings, NP   1 year ago Acute non-recurrent maxillary sinusitis   [facility] McElwee, Lauren A, NP   1 year ago Upper respiratory tract infection, unspecified type   [facility] Jon Billings, NP      Future Appointments            In 1 month Agbor-Etang, Aaron Edelman, MD [facility]           . losartan (COZAAR) 50 MG tablet [Pharmacy Med Name: Losartan Potassium 50MG TABS] 28 tablet     Sig: TAKE 1 TABLET BY MOUTH DAILY     Cardiovascular:  Angiotensin Receptor Blockers Failed - 04/16/2022  3:10 PM      Failed - Cr in normal range and within 180 days    Creat  Date Value Ref Range Status  08/16/2017 0.80 0.50 - 1.05 mg/dL Final    Comment:    For patients >46 years of age, the reference limit for Creatinine is approximately 13% higher for people identified as African-American. .    Creatinine, Ser  Date Value Ref Range Status  08/18/2021 0.60 0.57 - 1.00 mg/dL Final         Failed - K in normal range and within 180 days    Potassium  Date Value Ref Range Status  08/18/2021 4.3 3.5 - 5.2 mmol/L Final         Passed - Patient is not pregnant      Passed - Last BP in normal range    BP Readings from Last 1 Encounters:  01/29/22 106/73         Passed - Valid encounter within last 6 months    Recent Outpatient Visits          2 months ago  Non-recurrent acute serous otitis media of right ear   [facility] [place], Barbaraann Faster, NP   8 months ago Annual physical exam   [place], NP   11 months ago Knuckle pads   [facility] Jon Billings, NP   1 year  ago Acute non-recurrent maxillary sinusitis   [facility] McElwee, Lauren A, NP   1 year ago Upper respiratory tract infection, unspecified type   [facility] Jon Billings, NP      Future Appointments            In 1 month Agbor-Etang, Aaron Edelman, MD [facility], [facility]           . hydrochlorothiazide (HYDRODIURIL) 25 MG tablet [Pharmacy Med Name: hydroCHLOROthiazide 25MG TABS*] 28 tablet     Sig: TAKE 1 TABLET BY MOUTH DAILY     Cardiovascular: Diuretics - Thiazide Failed - 04/16/2022  3:10 PM      Failed - Cr in normal range and within 180 days    Creat  Date Value Ref Range Status  08/16/2017 0.80 0.50 - 1.05 mg/dL Final    Comment:    For patients >28 years of age, the reference limit for Creatinine is approximately 13% higher for people identified as African-American. .    Creatinine, Ser  Date Value Ref Range Status  08/18/2021 0.60 0.57 - 1.00 mg/dL Final         Failed - K in normal range and within 180 days    Potassium  Date Value Ref Range Status  08/18/2021 4.3 3.5 - 5.2 mmol/L Final         Failed - Na in normal range and within 180 days    Sodium  Date Value Ref Range Status  08/18/2021 142 134 - 144 mmol/L Final         Passed - Last BP in normal range    BP Readings from Last 1 Encounters:  01/29/22 106/73         Passed - Valid encounter within last 6 months    Recent Outpatient Visits          2 months ago Non-recurrent acute serous otitis media of right ear   [facility], Barbaraann Faster, NP   8 months ago Annual physical exam   [place], NP   11 months ago Knuckle pads   [facility] Jon Billings, NP   1 year ago Acute non-recurrent maxillary sinusitis   [facility] McElwee, Lauren A, NP   1 year ago Upper respiratory tract infection, unspecified type   [facility] Jon Billings, NP       Future Appointments            In 1 month Agbor-Etang, Aaron Edelman, MD [facility], [place]

## 2022-04-17 NOTE — Telephone Encounter (Signed)
Copied from [place] [id number]. Topic: General - Other >> Apr 17, 2022  2:04 PM Leitha Schuller wrote: Reason for CRM: pharmacy states   metFORMIN HCl 500 MG TAKE 2 TABLETS BY MOUTH WITH BREAKFAST  Protocol Details  Omeprazole 20 MG TAKE 1 CAPSULE DAILY  Protocol Details  Losartan Potassium 50 MG TAKE 1 TABLET BY MOUTH DAILY  Protocol Details  hydroCHLOROthiazide 25 MG TAKE 1 TABLET BY MOUTH DAILY   Does not have refills as noted in reason why refills was refused, pharmacy states they did not receive additional refills, on any of the medications  Pharmacy requesting new scripts sent it  Please assist further

## 2022-04-17 NOTE — Telephone Encounter (Signed)
Med refill request for Omeprazole, Losartan Potassium 50 MG, hydroCHLOROthiazide 25 MG. Last OV 01/29/22 with Marnee Guarneri, NP. Upcoming appt for 08/15/2022. Last filled April of 2023. Please advise.

## 2022-04-18 NOTE — Telephone Encounter (Signed)
Pt scheduled for 7/20 for a follow up. Pt was disputing November appt, I advised patient that is her CPE which is diff than scheduled appt due to her not being in the office in a long time.

## 2022-04-20 ENCOUNTER — Telehealth: Payer: Self-pay | Admitting: *Deleted

## 2022-04-20 NOTE — Telephone Encounter (Signed)
Pt called to see why provider only gave enough meds to get to appt next week. I explained that I forwarded it to the office due to the labs were more than 81 months old. Pt understood and has enough meds now to last until appt next week.

## 2022-04-26 ENCOUNTER — Encounter: Payer: Self-pay | Admitting: Nurse Practitioner

## 2022-04-26 ENCOUNTER — Ambulatory Visit: Payer: 59 | Admitting: Nurse Practitioner

## 2022-04-26 VITALS — BP 122/76 | HR 81 | Temp 98.6°F | Wt 182.0 lb

## 2022-04-26 DIAGNOSIS — E7800 Pure hypercholesterolemia, unspecified: Secondary | ICD-10-CM

## 2022-04-26 DIAGNOSIS — I10 Essential (primary) hypertension: Secondary | ICD-10-CM | POA: Diagnosis not present

## 2022-04-26 DIAGNOSIS — Z23 Encounter for immunization: Secondary | ICD-10-CM

## 2022-04-26 DIAGNOSIS — E119 Type 2 diabetes mellitus without complications: Secondary | ICD-10-CM | POA: Diagnosis not present

## 2022-04-26 DIAGNOSIS — Z1231 Encounter for screening mammogram for malignant neoplasm of breast: Secondary | ICD-10-CM

## 2022-04-26 MED ORDER — HYDROCHLOROTHIAZIDE 25 MG PO TABS: 25.0000 mg | ORAL_TABLET | Freq: Every day | ORAL | 1 refills | Status: DC

## 2022-04-26 MED ORDER — METFORMIN HCL 500 MG PO TABS: ORAL_TABLET | ORAL | 1 refills | Status: DC

## 2022-04-26 MED ORDER — LOSARTAN POTASSIUM 50 MG PO TABS: 50.0000 mg | ORAL_TABLET | Freq: Every day | ORAL | 1 refills | Status: DC

## 2022-04-26 MED ORDER — OMEPRAZOLE 40 MG PO CPDR: DELAYED_RELEASE_CAPSULE | ORAL | 1 refills | Status: DC

## 2022-04-26 MED ORDER — EZETIMIBE 10 MG PO TABS: 10.0000 mg | ORAL_TABLET | Freq: Every day | ORAL | 1 refills | Status: DC

## 2022-04-26 NOTE — Assessment & Plan Note (Signed)
Chronic.  Controlled.  Continue with current medication regimen on Zetia '10mg'$ .  Refill sent today.  Labs ordered today.  Return to clinic in 6 months for reevaluation.  Call sooner if concerns arise.

## 2022-04-26 NOTE — Assessment & Plan Note (Signed)
Chronic.  Controlled.  Last A1c was 6.5%.  Continue with current medication regimen of Metformin '500mg'$ .  Refills sent today.  Labs ordered today.  Return to clinic in 6 months for reevaluation.  Call sooner if concerns arise.

## 2022-04-26 NOTE — Progress Notes (Signed)
BP 122/76   Pulse 81   Temp 98.6 F (37 C) (Oral)   Wt 182 lb (82.6 kg)   LMP  (LMP Unknown)   SpO2 96%   BMI 30.29 kg/m    Subjective:    Patient ID: Debra [name], female    DOB: 01-21-1956, 66 y.o.   MRN: [id number]  HPI: Debra [name] is a 66 y.o. female  Chief Complaint  Patient presents with   Diabetes   HYPERTENSION / [place] Satisfied with current treatment?  yes Duration of hypertension: years BP monitoring frequency: not checking BP range:  BP medication side effects: no Past BP meds: losartan Duration of hyperlipidemia: years Cholesterol medication side effects: no Cholesterol supplements: none Past cholesterol medications: ezetimide (zetia) Medication compliance: excellent compliance Aspirin: no Recent stressors: no Recurrent headaches: no Visual changes: no Palpitations: no Dyspnea: no Chest pain: no Lower extremity edema: no Dizzy/lightheaded: no  DIABETES Hypoglycemic episodes:no Polydipsia/polyuria: no Visual disturbance: no Chest pain: no Paresthesias: no Glucose Monitoring: no  Accucheck frequency: Not Checking  Fasting glucose:  Post prandial:  Evening:  Before meals: Taking Insulin?: no  Long acting insulin:  Short acting insulin: Blood Pressure Monitoring: not checking Retinal Examination: Up to Date Foot Exam: Up to Date Diabetic Education: Not Completed Pneumovax: Up to Date Influenza: Up to Date Aspirin: no  Relevant past medical, surgical, family and social history reviewed and updated as indicated. Interim medical history since our last visit reviewed. Allergies and medications reviewed and updated.  Review of Systems  Eyes:  Negative for visual disturbance.  Respiratory:  Negative for chest tightness and shortness of breath.   Cardiovascular:  Negative for chest pain, palpitations and leg swelling.  Endocrine: Negative for polydipsia and polyuria.  Neurological:  Negative for dizziness, light-headedness,  numbness and headaches.    Per HPI unless specifically indicated above     Objective:    BP 122/76   Pulse 81   Temp 98.6 F (37 C) (Oral)   Wt 182 lb (82.6 kg)   LMP  (LMP Unknown)   SpO2 96%   BMI 30.29 kg/m   Wt Readings from Last 3 Encounters:  04/26/22 182 lb (82.6 kg)  01/29/22 182 lb 9.6 oz (82.8 kg)  12/08/21 178 lb (80.7 kg)    Physical Exam Vitals and nursing note reviewed.  Constitutional:      General: She is not in acute distress.    Appearance: Normal appearance. She is normal weight. She is not ill-appearing, toxic-appearing or diaphoretic.  HENT:     Head: Normocephalic.     Right Ear: External ear normal.     Left Ear: External ear normal.     Nose: Nose normal.     Mouth/Throat:     Mouth: Mucous membranes are moist.     Pharynx: Oropharynx is clear.  Eyes:     General:        Right eye: No discharge.        Left eye: No discharge.     Extraocular Movements: Extraocular movements intact.     Conjunctiva/sclera: Conjunctivae normal.     Pupils: Pupils are equal, round, and reactive to light.  Cardiovascular:     Rate and Rhythm: Normal rate and regular rhythm.     Heart sounds: No murmur heard. Pulmonary:     Effort: Pulmonary effort is normal. No respiratory distress.     Breath sounds: Normal breath sounds. No wheezing or rales.  Musculoskeletal:  Cervical back: Normal range of motion and neck supple.  Skin:    General: Skin is warm and dry.     Capillary Refill: Capillary refill takes less than 2 seconds.  Neurological:     General: No focal deficit present.     Mental Status: She is alert and oriented to person, place, and time. Mental status is at baseline.  Psychiatric:        Mood and Affect: Mood normal.        Behavior: Behavior normal.        Thought Content: Thought content normal.        Judgment: Judgment normal.     Results for orders placed or performed in visit on 12/01/21  Lipid panel  Result Value Ref Range    Cholesterol, Total 211 (H) 100 - 199 mg/dL   Triglycerides 204 (H) 0 - 149 mg/dL   HDL 49 >39 mg/dL   VLDL Cholesterol Cal 36 5 - 40 mg/dL   LDL Chol Calc (NIH) 126 (H) 0 - 99 mg/dL   Chol/HDL Ratio 4.3 0.0 - 4.4 ratio      Assessment & Plan:   Problem List Items Addressed This Visit       Cardiovascular and Mediastinum   Essential hypertension - Primary    Chronic.  Controlled.  Continue with current medication regimen on Hydrochlorothiazide 51m and Losartan 557m  Refills sent today.  Labs ordered today.  Return to clinic in 6 months for reevaluation.  Call sooner if concerns arise.        Relevant Medications   ezetimibe (ZETIA) 10 MG tablet   hydrochlorothiazide (HYDRODIURIL) 25 MG tablet   losartan (COZAAR) 50 MG tablet   Other Relevant Orders   Comp Met (CMET)     Endocrine   Diabetes type 2, controlled (HCC)    Chronic.  Controlled.  Last A1c was 6.5%.  Continue with current medication regimen of Metformin 50011m Refills sent today.  Labs ordered today.  Return to clinic in 6 months for reevaluation.  Call sooner if concerns arise.        Relevant Medications   losartan (COZAAR) 50 MG tablet   metFORMIN (GLUCOPHAGE) 500 MG tablet   Other Relevant Orders   Comp Met (CMET)   HgB A1c     Other   Hypercholesteremia    Chronic.  Controlled.  Continue with current medication regimen on Zetia 42m78mRefill sent today.  Labs ordered today.  Return to clinic in 6 months for reevaluation.  Call sooner if concerns arise.        Relevant Medications   ezetimibe (ZETIA) 10 MG tablet   hydrochlorothiazide (HYDRODIURIL) 25 MG tablet   losartan (COZAAR) 50 MG tablet   Other Relevant Orders   Lipid Profile   Other Visit Diagnoses     Need for tetanus booster       Relevant Orders   Tdap vaccine greater than or equal to 7yo IM (Completed)   Encounter for screening mammogram for malignant neoplasm of breast       Relevant Orders   MM 3D SCREEN BREAST BILATERAL         Follow up plan: Return in about 6 months (around 10/27/2022) for Physical and Fasting labs.

## 2022-04-26 NOTE — Assessment & Plan Note (Signed)
Chronic.  Controlled.  Continue with current medication regimen on Hydrochlorothiazide '25mg'$  and Losartan '50mg'$ .  Refills sent today.  Labs ordered today.  Return to clinic in 6 months for reevaluation.  Call sooner if concerns arise.

## 2022-04-27 ENCOUNTER — Encounter: Payer: Self-pay | Admitting: Nurse Practitioner

## 2022-04-27 LAB — COMPREHENSIVE METABOLIC PANEL
ALT: 36 IU/L — ABNORMAL HIGH (ref 0–32)
AST: 26 IU/L (ref 0–40)
Albumin/Globulin Ratio: 1.7 (ref 1.2–2.2)
Albumin: 4.6 g/dL (ref 3.9–4.9)
Alkaline Phosphatase: 102 IU/L (ref 44–121)
BUN/Creatinine Ratio: 20 (ref 12–28)
BUN: 13 mg/dL (ref 8–27)
Bilirubin Total: 0.2 mg/dL (ref 0.0–1.2)
CO2: 22 mmol/L (ref 20–29)
Calcium: 9.8 mg/dL (ref 8.7–10.3)
Chloride: 97 mmol/L (ref 96–106)
Creatinine, Ser: 0.64 mg/dL (ref 0.57–1.00)
Globulin, Total: 2.7 g/dL (ref 1.5–4.5)
Glucose: 121 mg/dL — ABNORMAL HIGH (ref 70–99)
Potassium: 4.1 mmol/L (ref 3.5–5.2)
Sodium: 137 mmol/L (ref 134–144)
Total Protein: 7.3 g/dL (ref 6.0–8.5)
eGFR: 99 mL/min/{1.73_m2} (ref >59)

## 2022-04-27 LAB — LIPID PANEL
Chol/HDL Ratio: 3.6 ratio (ref 0.0–4.4)
Cholesterol, Total: 147 mg/dL (ref 100–199)
HDL: 41 mg/dL (ref >39)
LDL Chol Calc (NIH): 24 mg/dL (ref 0–99)
Triglycerides: 622 mg/dL (ref 0–149)
VLDL Cholesterol Cal: 82 mg/dL — ABNORMAL HIGH (ref 5–40)

## 2022-04-27 LAB — HEMOGLOBIN A1C
Est. average glucose Bld gHb Est-mCnc: 148 mg/dL
Hgb A1c MFr Bld: 6.8 % — ABNORMAL HIGH (ref 4.8–5.6)

## 2022-04-27 NOTE — Progress Notes (Signed)
Please let patient know that her Triglycerides are significantly elevated from prior.  Please ask her if she was fasting.  If she was not she should come back and repeat this fasting.  If she was, I recommend decreasing processed foods and refined sugar intake.  Otherwise, lab work looks good.  Her A1c is a little elevated at 6.8 but still well controlled.  Please let me know if she has any questions.

## 2022-05-01 ENCOUNTER — Telehealth: Payer: Self-pay | Admitting: Cardiology

## 2022-05-01 DIAGNOSIS — E7800 Pure hypercholesterolemia, unspecified: Secondary | ICD-10-CM

## 2022-05-01 NOTE — Telephone Encounter (Signed)
Patient called to ask some questions regarding her results. Please call back

## 2022-05-02 NOTE — Telephone Encounter (Signed)
Called patient and left a VM requesting a call back. 

## 2022-05-04 NOTE — Telephone Encounter (Signed)
Attempted to call the patient. The phone did not ring, but went straight to voice mail.  I left a message asking the patient to please call back.

## 2022-05-04 NOTE — Telephone Encounter (Signed)
   Pt is returning call. Pt wants to get a call back today

## 2022-05-04 NOTE — Telephone Encounter (Signed)
Patient returned call, she is calling about her blood work.

## 2022-05-04 NOTE — Telephone Encounter (Signed)
Spoke with patient and she was concerned regarding her labs she had done at her PCP office. Reviewed chart and CT was normal and she did not fast for those labs. She stated that she did not want to have a heart attack over the weekend. Provided reassurance that based on her CT results being normal I would send message over to provider for his review. Reviewed that we normally manage this with medications but that provider would need to review and advise. Discussed that it would be next week before we can call her back and she verbalized understanding.

## 2022-05-08 NOTE — Telephone Encounter (Signed)
Spoke with patient and informed her that her Lipid Lab needs to be redrawn while fasting.  Order placed for [facility]. Patient verbalized understanding and agreed with plan.

## 2022-05-09 ENCOUNTER — Other Ambulatory Visit
Admission: RE | Admit: 2022-05-09 | Discharge: 2022-05-09 | Disposition: A | Discharge status: Home / Self Care | Payer: 59 | Attending: Cardiology | Admitting: Cardiology

## 2022-05-09 DIAGNOSIS — E7800 Pure hypercholesterolemia, unspecified: Secondary | ICD-10-CM | POA: Diagnosis present

## 2022-05-09 LAB — LIPID PANEL
Cholesterol: 97 mg/dL (ref 0–200)
HDL: 42 mg/dL (ref >40)
LDL Cholesterol: 16 mg/dL (ref 0–99)
Total CHOL/HDL Ratio: 2.3 RATIO
Triglycerides: 197 mg/dL — ABNORMAL HIGH (ref <150)
VLDL: 39 mg/dL (ref 0–40)

## 2022-05-25 ENCOUNTER — Ambulatory Visit: Payer: 59 | Admitting: Cardiology

## 2022-05-25 ENCOUNTER — Encounter: Payer: Self-pay | Admitting: Cardiology

## 2022-05-25 VITALS — BP 120/70 | HR 90 | Ht 65.0 in | Wt 180.5 lb

## 2022-05-25 DIAGNOSIS — I10 Essential (primary) hypertension: Secondary | ICD-10-CM

## 2022-05-25 DIAGNOSIS — E782 Mixed hyperlipidemia: Secondary | ICD-10-CM

## 2022-05-25 NOTE — Patient Instructions (Signed)
Medication Instructions:   Your physician recommends that you continue on your current medications as directed. Please refer to the Current Medication list given to you today.  *If you need a refill on your cardiac medications before your next appointment, please call your pharmacy*   Lab Work:  Your physician recommends that you return for a FASTING lipid profile:  In 6 months   - You will need to be fasting. Please do not have anything to eat or drink after midnight the morning you have the lab work. You may only have water or black coffee with no cream or sugar.   - Please go to the [facility]. You will check in at the front desk to the right as you walk into the atrium. Valet Parking is offered if needed. - No appointment needed. You may go any day between 7 am and 6 pm.     Follow-Up: At [facility], you and your health needs are our priority.  As part of our continuing mission to provide you with exceptional heart care, we have created designated Provider Care Teams.  These Care Teams include your primary Cardiologist (physician) and Advanced Practice Providers (APPs -  Physician Assistants and Nurse Practitioners) who all work together to provide you with the care you need, when you need it.  We recommend signing up for the patient portal called "MyChart".  Sign up information is provided on this After Visit Summary.  MyChart is used to connect with patients for Virtual Visits (Telemedicine).  Patients are able to view lab/test results, encounter notes, upcoming appointments, etc.  Non-urgent messages can be sent to your provider as well.   To learn more about what you can do with MyChart, go to [email or web address].    Your next appointment:   1 year(s)  The format for your next appointment:   In Person  Provider:   You may see Kate Sable, MD or one of the following Advanced Practice Providers on your designated Care Team:   Murray Hodgkins, NP Christell Faith, PA-C Cadence Kathlen Mody, [place]   Other Instructions   Important Information About Sugar

## 2022-05-25 NOTE — Progress Notes (Signed)
Cardiology Office Note:    Date:  05/25/2022   ID:  Debra [name], DOB 1956/03/23, MRN [id number]  PCP:  Jon Billings, NP  [facility] Cardiologist:  Kate Sable, MD  [place] Electrophysiologist:  None   Referring MD: Jon Billings, NP   Chief Complaint  Patient presents with   Other    4 month f/u no complaints today. Meds reviewed verbally with pt.    History of Present Illness:    Debra [name] is a 66 y.o. female with a hx of hypertension, hyperlipidemia who presents for follow-up.   Being seen for hyperlipidemia and medication management.  She is not tolerant to several cholesterol medications, had a change in insurance and Repatha was approved.  Also tolerates Zetia, compliant with medications.  Had a recent blood work 2 weeks ago for cholesterol, numbers looked improved compared to prior.  Feels well, has no concerns at this time.   Prior notes Echocardiogram 02/1883 normal systolic and diastolic function, EF 60 to 65% Coronary CTA 06/2020 normal, no evidence of CAD. She states all of her brothers and mother have history of high cholesterol. Patient did not tolerate statins, Vascepa, fenofibrate, Nexlizet  Past Medical History:  Diagnosis Date   Allergies    Borderline diabetes 03/30/2015   GERD (gastroesophageal reflux disease)    Hyperlipidemia    Hypertension    Lumbar back pain    Pre-diabetes    during pregnancy     Past Surgical History:  Procedure Laterality Date   CARPAL TUNNEL RELEASE Right    COLONOSCOPY WITH PROPOFOL N/A 12/30/2020   Procedure: COLONOSCOPY WITH PROPOFOL;  Surgeon: Lucilla Lame, MD;  Location: [facility];  Service: Endoscopy;  Laterality: N/A;   LUMBAR LAMINECTOMY/DECOMPRESSION MICRODISCECTOMY Left 10/16/2017   Procedure: Microlumbar decompression L4-5, L5-S1 left;  Surgeon: Susa Day, MD;  Location: [facility];  Service: Orthopedics;  Laterality: Left;  120 mins   PLANTAR FASCIA RELEASE Left     POLYPECTOMY  12/30/2020   Procedure: POLYPECTOMY;  Surgeon: Lucilla Lame, MD;  Location: [place];  Service: Endoscopy;;   RHINOPLASTY  1988   SHOULDER SURGERY Right 2011   TEMPOROMANDIBULAR JOINT SURGERY  1990   TOTAL ABDOMINAL HYSTERECTOMY  1998    Current Medications: Current Meds  Medication Sig   Biotin 1000 MCG CHEW Chew 1 each by mouth daily.   EPINEPHrine 0.3 mg/0.3 mL IJ SOAJ injection INJECT 1 PEN IN THIGH MUSCLE ONE TIME FOR ALLERGIC REACTION. MAY REPEAT IN 5 TO 15 MINUTES IF NEEDED   Evolocumab (REPATHA) 140 MG/ML SOSY Inject 1 pen. into the skin every 14 (fourteen) days.   ezetimibe (ZETIA) 10 MG tablet Take 1 tablet (10 mg total) by mouth daily.   fexofenadine (ALLEGRA) 180 MG tablet Take 180 mg by mouth daily.   hydrochlorothiazide (HYDRODIURIL) 25 MG tablet Take 1 tablet (25 mg total) by mouth daily.   losartan (COZAAR) 50 MG tablet Take 1 tablet (50 mg total) by mouth daily.   Melatonin 10 MG TABS Take 1 tablet by mouth at bedtime.    metFORMIN (GLUCOPHAGE) 500 MG tablet TAKE 2 TABLETS BY MOUTH WITH BREAKFAST   omeprazole (PRILOSEC) 40 MG capsule TAKE 1 CAPSULE(20 MG) BY MOUTH DAILY     Allergies:   Fenofibrate, Latex, Nexlizet [bempedoic acid-ezetimibe], Other, Pravastatin sodium, and Shellfish allergy   Social History   Socioeconomic History   Marital status: Married    Spouse name: Not on file   Number of children: Not  on file   Years of education: Not on file   Highest education level: Not on file  Occupational History   Not on file  Tobacco Use   Smoking status: Former    Types: Cigarettes   Smokeless tobacco: Never   Tobacco comments:    33 years quit   Vaping Use   Vaping Use: Never used  Substance and Sexual Activity   Alcohol use: Yes    Alcohol/week: 0.0 standard drinks of alcohol    Comment: OCCASIONALLY   Drug use: No   Sexual activity: Not Currently  Other Topics Concern   Not on file  Social History Narrative   Not on file    Social Determinants of Health   Financial Resource Strain: Not on file  Food Insecurity: Not on file  Transportation Needs: Not on file  Physical Activity: Not on file  Stress: Not on file  Social Connections: Not on file     Family History: The patient's family history includes Alcohol abuse in her mother; Breast cancer (age of onset: 80) in her mother; Hyperlipidemia in her brother, brother, and sister; Hypertension in her son; Obesity in her son. She was adopted.  ROS:   Please see the history of present illness.     All other systems reviewed and are negative.  EKGs/Labs/Other Studies Reviewed:    The following studies were reviewed today:   EKG:  EKG is ordered today.  EKG shows normal sinus rhythm, normal ECG  Recent Labs: 08/18/2021: Hemoglobin 14.5; Platelets 395; TSH 2.090 04/26/2022: ALT 36; BUN 13; Creatinine, Ser 0.64; Potassium 4.1; Sodium 137  Recent Lipid Panel    Component Value Date/Time   CHOL 97 05/09/2022 0654   CHOL 147 04/26/2022 1139   TRIG 197 (H) 05/09/2022 0654   HDL 42 05/09/2022 0654   HDL 41 04/26/2022 1139   CHOLHDL 2.3 05/09/2022 0654   VLDL 39 05/09/2022 0654   LDLCALC 16 05/09/2022 0654   LDLCALC 24 04/26/2022 1139   LDLCALC 171 (H) 08/16/2017 0801    Physical Exam:    VS:  BP 120/70 (BP Location: Left Arm, Patient Position: Sitting, Cuff Size: Normal)   Pulse 90   Ht '5\' 5"'$  (1.651 m)   Wt 180 lb 8 oz (81.9 kg)   LMP  (LMP Unknown)   SpO2 98%   BMI 30.04 kg/m     Wt Readings from Last 3 Encounters:  05/25/22 180 lb 8 oz (81.9 kg)  04/26/22 182 lb (82.6 kg)  01/29/22 182 lb 9.6 oz (82.8 kg)     GEN:  Well nourished, well developed in no acute distress HEENT: Normal NECK: No JVD; No carotid bruits CARDIAC: RRR, no murmurs, rubs, gallops RESPIRATORY:  Clear to auscultation without rales, wheezing or rhonchi  ABDOMEN: Soft, non-tender, non-distended MUSCULOSKELETAL:  No edema; No deformity  SKIN: Warm and  dry NEUROLOGIC:  Alert and oriented x 3 PSYCHIATRIC:  Normal affect   ASSESSMENT:    1. Mixed hyperlipidemia   2. Primary hypertension    PLAN:    In order of problems listed above:  Patient with history of hyperlipidemia, cholesterol improving, triglycerides still slightly elevated.  Continue Repatha, Zetia. patient is not tolerant to statins, fenofibrate, Vascepa or Nexlizet.  Repeat lipid panel in 6 months. Hypertension, BP controlled, continue losartan, HCTZ.  Follow-up in 1 year.  Total encounter time 30 minutes  Greater than 50% was spent in counseling and coordination of care with the patient   This note  was generated in part or whole with voice recognition software. Voice recognition is usually quite accurate but there are transcription errors that can and very often do occur. I apologize for any typographical errors that were not detected and corrected.  Medication Adjustments/Labs and Tests Ordered: Current medicines are reviewed at length with the patient today.  Concerns regarding medicines are outlined above.  Orders Placed This Encounter  Procedures   Lipid panel   EKG 12-Lead    No orders of the defined types were placed in this encounter.    Patient Instructions  Medication Instructions:   Your physician recommends that you continue on your current medications as directed. Please refer to the Current Medication list given to you today.  *If you need a refill on your cardiac medications before your next appointment, please call your pharmacy*   Lab Work:  Your physician recommends that you return for a FASTING lipid profile:  In 6 months   - You will need to be fasting. Please do not have anything to eat or drink after midnight the morning you have the lab work. You may only have water or black coffee with no cream or sugar.   - Please go to the [facility]. You will check in at the front desk to the right as you walk into the atrium. Valet  Parking is offered if needed. - No appointment needed. You may go any day between 7 am and 6 pm.     Follow-Up: At [facility], you and your health needs are our priority.  As part of our continuing mission to provide you with exceptional heart care, we have created designated Provider Care Teams.  These Care Teams include your primary Cardiologist (physician) and Advanced Practice Providers (APPs -  Physician Assistants and Nurse Practitioners) who all work together to provide you with the care you need, when you need it.  We recommend signing up for the patient portal called "MyChart".  Sign up information is provided on this After Visit Summary.  MyChart is used to connect with patients for Virtual Visits (Telemedicine).  Patients are able to view lab/test results, encounter notes, upcoming appointments, etc.  Non-urgent messages can be sent to your provider as well.   To learn more about what you can do with MyChart, go to [email or web address].    Your next appointment:   1 year(s)  The format for your next appointment:   In Person  Provider:   You may see Kate Sable, MD or one of the following Advanced Practice Providers on your designated Care Team:   Murray Hodgkins, NP Christell Faith, PA-C Cadence Kathlen Mody, [place]   Other Instructions   Important Information About Sugar         Signed, Kate Sable, MD  05/25/2022 4:42 PM    [place]

## 2022-06-08 ENCOUNTER — Ambulatory Visit: Payer: 59 | Admitting: Cardiology

## 2022-06-27 ENCOUNTER — Ambulatory Visit
Admission: RE | Admit: 2022-06-27 | Discharge: 2022-06-27 | Disposition: A | Discharge status: Home / Self Care | Payer: 59 | Source: Ambulatory Visit | Attending: Nurse Practitioner | Admitting: Nurse Practitioner

## 2022-06-27 DIAGNOSIS — Z1231 Encounter for screening mammogram for malignant neoplasm of breast: Secondary | ICD-10-CM | POA: Insufficient documentation

## 2022-06-28 NOTE — Progress Notes (Signed)
Please let patient know her Mammogram did not show any evidence of a malignancy.  The recommendation is to repeat the Mammogram in 1 year.  

## 2022-08-01 LAB — HM DIABETES EYE EXAM

## 2022-08-15 ENCOUNTER — Encounter: Payer: 59 | Admitting: Nurse Practitioner

## 2022-09-06 ENCOUNTER — Ambulatory Visit: Payer: 59 | Admitting: Internal Medicine

## 2022-10-10 ENCOUNTER — Other Ambulatory Visit: Payer: Self-pay | Admitting: Nurse Practitioner

## 2022-10-10 DIAGNOSIS — I10 Essential (primary) hypertension: Secondary | ICD-10-CM

## 2022-10-11 NOTE — Telephone Encounter (Signed)
Requested Prescriptions  Pending Prescriptions Disp Refills   omeprazole (PRILOSEC) 40 MG capsule [Pharmacy Med Name: Omeprazole 40MG CPDR] 90 capsule 0    Sig: TAKE 1 CAPSULE(20 MG) BY MOUTH DAILY     Gastroenterology: Proton Pump Inhibitors Passed - 10/10/2022 12:09 PM      Passed - Valid encounter within last 12 months    Recent Outpatient Visits           5 months ago Essential hypertension   [facility] Jon Billings, NP   8 months ago Non-recurrent acute serous otitis media of right ear   [place], Henrine Screws T, NP   1 year ago Annual physical exam   [facility] Jon Billings, NP   1 year ago Knuckle pads   [facility] [place], Santiago Glad, NP   1 year ago Acute non-recurrent maxillary sinusitis   [facility] Mc[place]we[place], Scheryl Darter, NP       Future Appointments             In 3 weeks Teodora Medici, [facility], [facility]             metFORMIN (GLUCOPHAGE) 500 MG tablet [Pharmacy Med Name: metFORMIN HCl 500MG TABS*] 90 tablet 0    Sig: TAKE 2 TABLETS BY MOUTH WITH BREAKFAST     Endocrinology:  Diabetes - Biguanides Failed - 10/10/2022 12:09 PM      Failed - B12 Level in normal range and within 720 days    No results found for: "VITAMINB12"       Failed - CBC within normal limits and completed in the last 12 months    WBC  Date Value Ref Range Status  08/18/2021 6.6 3.4 - 10.8 x10E3/uL Final  10/11/2017 8.0 4.0 - 10.5 K/uL Final   RBC  Date Value Ref Range Status  08/18/2021 4.85 3.77 - 5.28 x10E6/uL Final  10/11/2017 4.71 3.87 - 5.11 MIL/uL Final   Hemoglobin  Date Value Ref Range Status  08/18/2021 14.5 11.1 - 15.9 g/dL Final   Hematocrit  Date Value Ref Range Status  08/18/2021 43.1 34.0 - 46.6 % Final   MCHC  Date Value Ref Range Status  08/18/2021 33.6 31.5 - 35.7 g/dL Final  10/11/2017 34.3 30.0 - 36.0 g/dL Final   [facility]  Date Value Ref Range Status   08/18/2021 29.9 26.6 - 33.0 pg Final  10/11/2017 30.6 26.0 - 34.0 pg Final   MCV  Date Value Ref Range Status  08/18/2021 89 79 - 97 fL Final   No results found for: "PLTCOUNTKUC", "LABPLAT", "POCPLA" RDW  Date Value Ref Range Status  08/18/2021 12.7 11.7 - 15.4 % Final         Passed - Cr in normal range and within 360 days    Creat  Date Value Ref Range Status  08/16/2017 0.80 0.50 - 1.05 mg/dL Final    Comment:    For patients >58 years of age, the reference limit for Creatinine is approximately 13% higher for people identified as African-American. .    Creatinine, Ser  Date Value Ref Range Status  04/26/2022 0.64 0.57 - 1.00 mg/dL Final         Passed - HBA1C is between 0 and 7.9 and within 180 days    Hgb A1c MFr Bld  Date Value Ref Range Status  04/26/2022 6.8 (H) 4.8 - 5.6 % Final    Comment:  Prediabetes: 5.7 - 6.4          Diabetes: >6.4          Glycemic control for adults with diabetes: <7.0          Passed - eGFR in normal range and within 360 days    GFR, Est African American  Date Value Ref Range Status  08/16/2017 94 > OR = 60 mL/min/1.86m Final   GFR calc Af Amer  Date Value Ref Range Status  05/11/2020 109 >59 mL/min/1.73 Final    Comment:    **[facility] currently reports eGFR in compliance with the current**   recommendations of the [employer] [facility] will   update reporting as new guidelines are published from the NKF-ASN   Task force.    GFR, Est Non African American  Date Value Ref Range Status  08/16/2017 81 > OR = 60 mL/min/1.72mFinal   GFR calc non Af Amer  Date Value Ref Range Status  05/11/2020 94 >59 mL/min/1.73 Final   eGFR  Date Value Ref Range Status  04/26/2022 99 >59 mL/min/1.73 Final         Passed - Valid encounter within last 6 months    Recent Outpatient Visits           5 months ago [facility]   [facility]   8 months ago  Non-recurrent acute serous otitis media of right ear   [facility] [place]   1 year ago Annual physical exam   [facility]   1 year ago Knuckle pads   [facility]   1 year ago Acute non-recurrent maxillary sinusitis   [facility] McElwee, LaScheryl DarterNP       Future Appointments             In 3 weeks [facility]             losartan (COZAAR) 50 MG tablet [Pharmacy Med Name: Losartan Potassium 50MG TABS] 90 tablet 0    Sig: TAKE 1 TABLET (50 MG TOTAL) BY MOUTH DAILY.     Cardiovascular:  Angiotensin Receptor Blockers Passed - 10/10/2022 12:09 PM      Passed - Cr in normal range and within 180 days    Creat  Date Value Ref Range Status  08/16/2017 0.80 0.50 - 1.05 mg/dL Final    Comment:    For patients >4960ears of age, the reference limit for Creatinine is approximately 13% higher for people identified as African-American. .    Creatinine, Ser  Date Value Ref Range Status  04/26/2022 0.64 0.57 - 1.00 mg/dL Final         Passed - K in normal range and within 180 days    Potassium  Date Value Ref Range Status  04/26/2022 4.1 3.5 - 5.2 mmol/L Final         Passed - Patient is not pregnant      Passed - Last BP in normal range    BP Readings from Last 1 Encounters:  05/25/22 120/70         Passed - Valid encounter within last 6 months    Recent Outpatient Visits           5 months ago Essential hypertension   [place]   8 months ago Non-recurrent acute serous otitis media  of right ear   [facility] Venita Lick, NP   1 year ago Annual physical exam   [facility] Jon Billings, NP   1 year ago Knuckle pads   [facility] Jon Billings, NP   1 year ago Acute non-recurrent maxillary sinusitis   [facility] McElwee, Scheryl Darter, NP       Future Appointments             In 3 weeks Teodora Medici, DO [facility], [facility]             hydrochlorothiazide (HYDRODIURIL) 25 MG tablet [Pharmacy Med Name: hydroCHLOROthiazide 25MG TABS*] 90 tablet 0    Sig: TAKE 1 TABLET (25 MG TOTAL) BY MOUTH DAILY.     Cardiovascular: Diuretics - Thiazide Passed - 10/10/2022 12:09 PM      Passed - Cr in normal range and within 180 days    Creat  Date Value Ref Range Status  08/16/2017 0.80 0.50 - 1.05 mg/dL Final    Comment:    For patients >5 years of age, the reference limit for Creatinine is approximately 13% higher for people identified as African-American. .    Creatinine, Ser  Date Value Ref Range Status  04/26/2022 0.64 0.57 - 1.00 mg/dL Final         Passed - K in normal range and within 180 days    Potassium  Date Value Ref Range Status  04/26/2022 4.1 3.5 - 5.2 mmol/L Final         Passed - Na in normal range and within 180 days    Sodium  Date Value Ref Range Status  04/26/2022 137 134 - 144 mmol/L Final         Passed - Last BP in normal range    BP Readings from Last 1 Encounters:  05/25/22 120/70         Passed - Valid encounter within last 6 months    Recent Outpatient Visits           5 months ago Essential hypertension   [facility] Jon Billings, NP   8 months ago Non-recurrent acute serous otitis media of right ear   [place] Venita Lick, NP   1 year ago Annual physical exam   [facility] Jon Billings, NP   1 year ago Knuckle pads   [facility] Jon Billings, NP   1 year ago Acute non-recurrent maxillary sinusitis   [facility] McElwee, Scheryl Darter, NP       Future Appointments             In 3 weeks Teodora Medici, DO [facility], [facility]             ezetimibe (ZETIA) 10 MG tablet [Pharmacy Med Name: Ezetimibe 10MG  TABS] 90 tablet 0    Sig: TAKE 1 TABLET (10 MG TOTAL) BY MOUTH DAILY.     Cardiovascular:  Antilipid - Sterol Transport Inhibitors Failed - 10/10/2022 12:09 PM      Failed - ALT in normal range and within 360 days    ALT  Date Value Ref Range Status  04/26/2022 36 (H) 0 - 32 IU/L Final         Failed - Lipid Panel in normal range within the last 12 months    Cholesterol, Total  Date Value Ref Range Status  04/26/2022 147 100 - 199 mg/dL Final  Cholesterol  Date Value Ref Range Status  05/09/2022 97 0 - 200 mg/dL Final   LDL Cholesterol (Calc)  Date Value Ref Range Status  08/16/2017 171 (H) mg/dL (calc) Final    Comment:    Reference range: <100 . Desirable range <100 mg/dL for primary prevention;   <70 mg/dL for patients with CHD or diabetic patients  with > or = 2 CHD risk factors. Marland Kitchen LDL-C is now calculated using the Martin-Hopkins  calculation, which is a validated novel method providing  better accuracy than the Friedewald equation in the  estimation of LDL-C.  Cresenciano Genre et al. Annamaria Helling. 9983;382(50): 2061-2068  (http://education.QuestDiagnostics.com/faq/FAQ164)    LDL Chol Calc (NIH)  Date Value Ref Range Status  04/26/2022 24 0 - 99 mg/dL Final   LDL Cholesterol  Date Value Ref Range Status  05/09/2022 16 0 - 99 mg/dL Final    Comment:           Total Cholesterol/HDL:CHD Risk Coronary Heart Disease Risk Table                     Men   Women  1/2 Average Risk   3.4   3.3  Average Risk       5.0   4.4  2 X Average Risk   9.6   7.1  3 X Average Risk  23.4   11.0        Use the calculated Patient Ratio above and the CHD Risk Table to determine the patient's CHD Risk.        ATP III CLASSIFICATION (LDL):  <100     mg/dL   Optimal  100-129  mg/dL   Near or Above                    Optimal  130-159  mg/dL   Borderline  160-189  mg/dL   High  >190     mg/dL   Very High Performed at [facility], [place], [place] [address]    HDL   Date Value Ref Range Status  05/09/2022 42 >40 mg/dL Final  04/26/2022 41 >39 mg/dL Final   Triglycerides  Date Value Ref Range Status  05/09/2022 197 (H) <150 mg/dL Final         Passed - AST in normal range and within 360 days    AST  Date Value Ref Range Status  04/26/2022 26 0 - 40 IU/L Final         Passed - Patient is not pregnant      Passed - Valid encounter within last 12 months    Recent Outpatient Visits           5 months ago Essential hypertension   [facility] Jon Billings, NP   8 months ago Non-recurrent acute serous otitis media of right ear   [place] Venita Lick, NP   1 year ago Annual physical exam   [facility] Jon Billings, NP   1 year ago Knuckle pads   [facility] Jon Billings, NP   1 year ago Acute non-recurrent maxillary sinusitis   [facility] Charyl Dancer, NP       Future Appointments             In 3 weeks Teodora Medici, [facility], [facility]

## 2022-10-31 ENCOUNTER — Other Ambulatory Visit: Payer: Self-pay | Admitting: Nurse Practitioner

## 2022-10-31 NOTE — Telephone Encounter (Signed)
Requested medication (s) are due for refill today:yes  Requested medication (s) are on the active medication list: yes    Last refill: 10/11/22  #90 0 refills  Future visit scheduled Yes 11/02/22  Notes to clinic: Last refill by Jon Billings, please review. Thank you.  Requested Prescriptions  Pending Prescriptions Disp Refills   metFORMIN (GLUCOPHAGE) 500 MG tablet [Pharmacy Med Name: metFORMIN HCl '500MG'$  TABS*] 56 tablet     Sig: TAKE 2 TABLETS BY MOUTH WITH BREAKFAST     Endocrinology:  Diabetes - Biguanides Failed - 10/31/2022 10:47 AM      Failed - HBA1C is between 0 and 7.9 and within 180 days    Hgb A1c MFr Bld  Date Value Ref Range Status  04/26/2022 6.8 (H) 4.8 - 5.6 % Final    Comment:             Prediabetes: 5.7 - 6.4          Diabetes: >6.4          Glycemic control for adults with diabetes: <7.0          Failed - B12 Level in normal range and within 720 days    No results found for: "VITAMINB12"       Failed - Valid encounter within last 6 months    Recent Outpatient Visits           6 months ago Essential hypertension   [place], Karen, NP   9 months ago Non-recurrent acute serous otitis media of right ear   [place], Henrine Screws T, NP   1 year ago Annual physical exam   [place] Jon Billings, NP   1 year ago Knuckle pads   [place] Jon Billings, NP   1 year ago Acute non-recurrent maxillary sinusitis   [place] Charyl Dancer, NP       Future Appointments             In 2 days Teodora Medici, [facility], [facility]            Failed - CBC within normal limits and completed in the last 12 months    WBC  Date Value Ref Range Status  08/18/2021 6.6 3.4 - 10.8 x10E3/uL Final  10/11/2017 8.0 4.0 - 10.5 K/uL Final   RBC  Date Value Ref Range Status   08/18/2021 4.85 3.77 - 5.28 x10E6/uL Final  10/11/2017 4.71 3.87 - 5.11 MIL/uL Final   Hemoglobin  Date Value Ref Range Status  08/18/2021 14.5 11.1 - 15.9 g/dL Final   Hematocrit  Date Value Ref Range Status  08/18/2021 43.1 34.0 - 46.6 % Final   MCHC  Date Value Ref Range Status  08/18/2021 33.6 31.5 - 35.7 g/dL Final  10/11/2017 34.3 30.0 - 36.0 g/dL Final   [facility]  Date Value Ref Range Status  08/18/2021 29.9 26.6 - 33.0 pg Final  10/11/2017 30.6 26.0 - 34.0 pg Final   MCV  Date Value Ref Range Status  08/18/2021 89 79 - 97 fL Final   No results found for: "PLTCOUNTKUC", "LABPLAT", "POCPLA" RDW  Date Value Ref Range Status  08/18/2021 12.7 11.7 - 15.4 % Final         Passed - Cr in normal range and within 360 days    Creat  Date Value Ref Range Status  08/16/2017 0.80  0.50 - 1.05 mg/dL Final    Comment:    For patients >57 years of age, the reference limit for Creatinine is approximately 13% higher for people identified as African-American. .    Creatinine, Ser  Date Value Ref Range Status  04/26/2022 0.64 0.57 - 1.00 mg/dL Final         Passed - eGFR in normal range and within 360 days    GFR, Est African American  Date Value Ref Range Status  08/16/2017 94 > OR = 60 mL/min/1.38m Final   GFR calc Af Amer  Date Value Ref Range Status  05/11/2020 109 >59 mL/min/1.73 Final    Comment:    **[facility] currently reports eGFR in compliance with the current**   recommendations of the [employer] [facility] will   update reporting as new guidelines are published from the NKF-ASN   Task force.    GFR, Est Non African American  Date Value Ref Range Status  08/16/2017 81 > OR = 60 mL/min/1.763mFinal   GFR calc non Af Amer  Date Value Ref Range Status  05/11/2020 94 >59 mL/min/1.73 Final   eGFR  Date Value Ref Range Status  04/26/2022 99 >59 mL/min/1.73 Final

## 2022-11-02 ENCOUNTER — Ambulatory Visit: Payer: 59 | Admitting: Internal Medicine

## 2022-11-02 ENCOUNTER — Encounter: Payer: Self-pay | Admitting: Internal Medicine

## 2022-11-02 VITALS — BP 130/72 | HR 92 | Temp 98.4°F | Resp 16 | Ht 65.0 in | Wt 183.5 lb

## 2022-11-02 DIAGNOSIS — I10 Essential (primary) hypertension: Secondary | ICD-10-CM

## 2022-11-02 DIAGNOSIS — Z23 Encounter for immunization: Secondary | ICD-10-CM | POA: Diagnosis not present

## 2022-11-02 DIAGNOSIS — J302 Other seasonal allergic rhinitis: Secondary | ICD-10-CM | POA: Diagnosis not present

## 2022-11-02 DIAGNOSIS — E782 Mixed hyperlipidemia: Secondary | ICD-10-CM | POA: Diagnosis not present

## 2022-11-02 DIAGNOSIS — E1165 Type 2 diabetes mellitus with hyperglycemia: Secondary | ICD-10-CM | POA: Diagnosis not present

## 2022-11-02 DIAGNOSIS — N898 Other specified noninflammatory disorders of vagina: Secondary | ICD-10-CM

## 2022-11-02 DIAGNOSIS — K219 Gastro-esophageal reflux disease without esophagitis: Secondary | ICD-10-CM

## 2022-11-02 NOTE — Patient Instructions (Signed)
It was great seeing you today!  Plan discussed at today's visit: -Blood work ordered today, results will be uploaded to [place].  -Urine test to screen for diabetic kidney disease today -Prevnar 20 vaccine as well   Follow up in: 6 months for Pap - please keep me up dated on pelvic pain in the mean time  Take care and let us know if you have any questions or concerns prior to your next visit.  Dr. Rosana Berger

## 2022-11-02 NOTE — Progress Notes (Signed)
New Patient Office Visit  Subjective    Patient ID: Debra [name], female    DOB: January 11, 1956  Age: 67 y.o. MRN: [id number]  CC:  Chief Complaint  Patient presents with   Establish Care   Cyst    Pt prefers to discuss with provider    HPI [name] presents to establish care.  She does have 1 acute problem she would like to talk about today.  She states about a month ago she felt a sharp lower abdominal pain that was followed by a small amount of brown vaginal discharge.  She denies any bleeding or any other vaginal discharge before or since then.  She is not currently sexually active.  She denies any further abdominal or pelvic pain.  Hypertension: -Medications: HCTZ 25 mg, Losartan 50 mg -Patient is compliant with above medications and reports no side effects. -Denies any SOB, CP, vision changes, LE edema or symptoms of hypotension  HLD: -Medications: Repatha, Zetia 10 mg -Patient is compliant with above medications and reports no side effects.  -Last lipid panel: Lipid Panel     Component Value Date/Time   CHOL 97 05/09/2022 0654   CHOL 147 04/26/2022 1139   TRIG 197 (H) 05/09/2022 0654   HDL 42 05/09/2022 0654   HDL 41 04/26/2022 1139   CHOLHDL 2.3 05/09/2022 0654   VLDL 39 05/09/2022 0654   LDLCALC 16 05/09/2022 0654   LDLCALC 24 04/26/2022 1139   LDLCALC 171 (H) 08/16/2017 0801   LABVLDL 82 (H) 04/26/2022 1139    Diabetes, Type 2: -Last A1c 6.8 7/23 -Medications: Metformin 1000 mg once daily  -Patient is compliant with the above medications and reports no side effects.  -Eye exam: UTD 11/23 -Foot exam: Due today  -Microalbumin: Due today  -Statin: No but on Repatha  -PNA vaccine: Due  -Denies symptoms of hypoglycemia, polyuria, polydipsia, numbness extremities, foot ulcers/trauma.   Seasonal allergies: -Currently on Allegra 180 mg daily but only in the spring and fall -No symptoms currently  GERD: -Currently on Prilosec 40 mg daily, controlling  symptoms well  Health maintenance: -Blood work due -Mammogram 9/23, BI-RADS 1  Outpatient Encounter Medications as of 11/02/2022  Medication Sig   Biotin 1000 MCG CHEW Chew 1 each by mouth daily.   EPINEPHrine 0.3 mg/0.3 mL IJ SOAJ injection INJECT 1 PEN IN THIGH MUSCLE ONE TIME FOR ALLERGIC REACTION. MAY REPEAT IN 5 TO 15 MINUTES IF NEEDED   Evolocumab (REPATHA) 140 MG/ML SOSY Inject 1 pen. into the skin every 14 (fourteen) days.   ezetimibe (ZETIA) 10 MG tablet TAKE 1 TABLET (10 MG TOTAL) BY MOUTH DAILY.   fexofenadine (ALLEGRA) 180 MG tablet Take 180 mg by mouth daily.   hydrochlorothiazide (HYDRODIURIL) 25 MG tablet TAKE 1 TABLET (25 MG TOTAL) BY MOUTH DAILY.   losartan (COZAAR) 50 MG tablet TAKE 1 TABLET (50 MG TOTAL) BY MOUTH DAILY.   Melatonin 10 MG TABS Take 1 tablet by mouth at bedtime.    metFORMIN (GLUCOPHAGE) 500 MG tablet TAKE 2 TABLETS BY MOUTH WITH BREAKFAST   omeprazole (PRILOSEC) 40 MG capsule TAKE 1 CAPSULE(20 MG) BY MOUTH DAILY   No facility-administered encounter medications on file as of 11/02/2022.    Past Medical History:  Diagnosis Date   Allergies    Borderline diabetes 03/30/2015   GERD (gastroesophageal reflux disease)    Hyperlipidemia    Hypertension    Lumbar back pain    Pre-diabetes    during pregnancy  Past Surgical History:  Procedure Laterality Date   CARPAL TUNNEL RELEASE Right    COLONOSCOPY WITH PROPOFOL N/A 12/30/2020   Procedure: COLONOSCOPY WITH PROPOFOL;  Surgeon: Lucilla Lame, MD;  Location: [facility];  Service: Endoscopy;  Laterality: N/A;   LUMBAR LAMINECTOMY/DECOMPRESSION MICRODISCECTOMY Left 10/16/2017   Procedure: Microlumbar decompression L4-5, L5-S1 left;  Surgeon: Susa Day, MD;  Location: [facility];  Service: Orthopedics;  Laterality: Left;  120 mins   PLANTAR FASCIA RELEASE Left    POLYPECTOMY  12/30/2020   Procedure: POLYPECTOMY;  Surgeon: Lucilla Lame, MD;  Location: [facility];  Service:  Endoscopy;;   [place] Right 2011   [place]   TOTAL ABDOMINAL HYSTERECTOMY  1998    Family History  Adopted: Yes  Problem Relation Age of Onset   Breast cancer Mother 89   Alcohol abuse Mother    Hyperlipidemia Sister    Hyperlipidemia Brother    Hyperlipidemia Brother    Obesity Son    Hypertension Son     Social History   Socioeconomic History   Marital status: Married    Spouse name: Not on file   Number of children: Not on file   Years of education: Not on file   Highest education level: Not on file  Occupational History   Not on file  Tobacco Use   Smoking status: Former    Types: Cigarettes   Smokeless tobacco: Never   Tobacco comments:    33 years quit   Vaping Use   Vaping Use: Never used  Substance and Sexual Activity   Alcohol use: Yes    Comment: Socially   Drug use: No   Sexual activity: Not Currently  Other Topics Concern   Not on file  Social History Narrative   Not on file   Social Determinants of Health   Financial Resource Strain: Not on file  Food Insecurity: Not on file  Transportation Needs: Not on file  Physical Activity: Not on file  Stress: Not on file  Social Connections: Not on file  Intimate Partner Violence: Not on file    Review of Systems  Constitutional:  Negative for chills and fever.  Eyes:  Negative for blurred vision.  Respiratory:  Negative for shortness of breath.   Cardiovascular:  Negative for chest pain.  Gastrointestinal:  Negative for abdominal pain.  Genitourinary:  Negative for dysuria, flank pain, frequency and hematuria.  Neurological:  Negative for headaches.        Objective    BP 130/72   Pulse 92   Temp 98.4 F (36.9 C) (Oral)   Resp 16   Ht '5\' 5"'$  (1.651 m)   Wt 183 lb 8 oz (83.2 kg)   LMP  (LMP Unknown)   SpO2 98%   BMI 30.54 kg/m   Physical Exam Constitutional:      Appearance: Normal appearance.  HENT:     Head:  Normocephalic and atraumatic.  Eyes:     Conjunctiva/sclera: Conjunctivae normal.  Cardiovascular:     Rate and Rhythm: Normal rate and regular rhythm.     Pulses:          Dorsalis pedis pulses are 2+ on the right side and 2+ on the left side.  Pulmonary:     Effort: Pulmonary effort is normal.     Breath sounds: Normal breath sounds.  Musculoskeletal:     Right lower leg: No edema.  Left lower leg: No edema.     Right foot: Normal range of motion. No deformity, bunion, Charcot foot, foot drop or prominent metatarsal heads.     Left foot: Normal range of motion. No deformity, bunion, Charcot foot, foot drop or prominent metatarsal heads.  Feet:     Right foot:     Protective Sensation: 6 sites tested.  6 sites sensed.     Skin integrity: Skin integrity normal.     Toenail Condition: Right toenails are normal.     Left foot:     Protective Sensation: 6 sites tested.  6 sites sensed.     Skin integrity: Skin integrity normal.     Toenail Condition: Left toenails are normal.  Skin:    General: Skin is warm and dry.  Neurological:     General: No focal deficit present.     Mental Status: She is alert. Mental status is at baseline.  Psychiatric:        Mood and Affect: Mood normal.        Behavior: Behavior normal.         Assessment & Plan:   1. Essential hypertension: Chronic, blood pressure at goal here today.  Due for yearly labs.  Continue losartan 50 mg, HCTZ 25 mg daily.  Recheck in 6 months.  - CBC w/Diff/Platelet - COMPLETE METABOLIC PANEL WITH GFR  2. Mixed hyperlipidemia: Stable, much improved since starting Repatha.  She does have a lipid specialist she follows up with.  She is currently on Repatha 140 mg every 2 weeks and Zetia 10 mg daily.  3. Controlled type 2 diabetes mellitus with hyperglycemia, without long-term current use of insulin [facility]): Due for A1c today as well as urine microalbumin.  Foot exam done today as well.  Last A1c controlled, continue  metformin at 1000 mg daily.  - HgB A1c - Urine Microalbumin w/creat. ratio - HM Diabetes Foot Exam  4. Seasonal allergies: Stable, taking Allegra as needed, symptoms worse in the spring and fall.  5. Gastroesophageal reflux disease, unspecified whether esophagitis present: Stable, symptoms well-controlled on Prilosec 40 mg, will start taking it only as needed  6. Vaginal discharge: Had brown vaginal discharge 1 time after a sharp lower abdominal pain a few months ago.  Has not had symptoms before or since.  Will plan on doing patient's pelvic exam/Pap is due over the summer but patient will let me know if she has the symptoms again, any vaginal discharge, bleeding any urinary symptoms prior to that appointment.  7. Vaccine for streptococcus pneumoniae and influenza: Prevnar 20 vaccine administered today.  - Pneumococcal conjugate vaccine 20-valent (Prevnar 20)   Return in about 6 months (around 05/03/2023) for CPE w/Pap .   Teodora Medici, DO

## 2022-11-03 LAB — COMPLETE METABOLIC PANEL WITH GFR
AG Ratio: 1.6 (calc) (ref 1.0–2.5)
ALT: 34 U/L — ABNORMAL HIGH (ref 6–29)
AST: 19 U/L (ref 10–35)
Albumin: 4.5 g/dL (ref 3.6–5.1)
Alkaline phosphatase (APISO): 87 U/L (ref 37–153)
BUN: 16 mg/dL (ref 7–25)
CO2: 24 mmol/L (ref 20–32)
Calcium: 9.7 mg/dL (ref 8.6–10.4)
Chloride: 98 mmol/L (ref 98–110)
Creat: 0.61 mg/dL (ref 0.50–1.05)
Globulin: 2.8 g/dL (calc) (ref 1.9–3.7)
Glucose, Bld: 171 mg/dL — ABNORMAL HIGH (ref 65–99)
Potassium: 4.4 mmol/L (ref 3.5–5.3)
Sodium: 137 mmol/L (ref 135–146)
Total Bilirubin: 0.2 mg/dL (ref 0.2–1.2)
Total Protein: 7.3 g/dL (ref 6.1–8.1)
eGFR: 100 mL/min/{1.73_m2} (ref >=60)

## 2022-11-03 LAB — CBC WITH DIFFERENTIAL/PLATELET
Absolute Monocytes: 672 cells/uL (ref 200–950)
Basophils Absolute: 59 cells/uL (ref 0–200)
Basophils Relative: 0.7 %
Eosinophils Absolute: 126 cells/uL (ref 15–500)
Eosinophils Relative: 1.5 %
HCT: 41.6 % (ref 35.0–45.0)
Hemoglobin: 14 g/dL (ref 11.7–15.5)
Lymphs Abs: 3587 cells/uL (ref 850–3900)
MCH: 29.9 pg (ref 27.0–33.0)
MCHC: 33.7 g/dL (ref 32.0–36.0)
MCV: 88.9 fL (ref 80.0–100.0)
MPV: 10 fL (ref 7.5–12.5)
Monocytes Relative: 8 %
Neutro Abs: 3956 cells/uL (ref 1500–7800)
Neutrophils Relative %: 47.1 %
Platelets: 380 10*3/uL (ref 140–400)
RBC: 4.68 10*6/uL (ref 3.80–5.10)
RDW: 12.5 % (ref 11.0–15.0)
Total Lymphocyte: 42.7 %
WBC: 8.4 10*3/uL (ref 3.8–10.8)

## 2022-11-03 LAB — HEMOGLOBIN A1C
Hgb A1c MFr Bld: 7.4 % of total Hgb — ABNORMAL HIGH (ref <5.7)
Mean Plasma Glucose: 166 mg/dL
eAG (mmol/L): 9.2 mmol/L

## 2022-11-03 LAB — MICROALBUMIN / CREATININE URINE RATIO
Creatinine, Urine: 52 mg/dL (ref 20–275)
Microalb Creat Ratio: 6 mcg/mg creat (ref <30)
Microalb, Ur: 0.3 mg/dL

## 2022-11-05 ENCOUNTER — Other Ambulatory Visit: Payer: Self-pay | Admitting: Internal Medicine

## 2022-11-05 NOTE — Telephone Encounter (Signed)
Medication Refill - Medication: metFORMIN (GLUCOPHAGE) 500 MG tablet  Has the patient contacted their pharmacy? Yes.    Preferred Pharmacy (with phone number or street name):  [facility] [place], [place] [address] Phone: [phone]  Fax: [phone]     Has the patient been seen for an appointment in the last year OR does the patient have an upcoming appointment? Yes.    Agent: Please be advised that RX refills may take up to 3 business days. We ask that you follow-up with your pharmacy.

## 2022-11-05 NOTE — Telephone Encounter (Signed)
Pt called for medication directions, please advise. Requesting a call back to clarify how much she is supposed to be taking. Please call back

## 2022-11-05 NOTE — Telephone Encounter (Signed)
The patient called back in because she spoke with the pharmacy,  [facility] [place], [place] [address] Phone: [phone]  Fax: [phone]     And they told her the medicine is supposed to be increased to 1,000 mg twice a day or to a total of 2,000 mg daily. Please assist patient further as she wants to make sure this is correct stating that is a big jump.

## 2022-11-06 ENCOUNTER — Ambulatory Visit: Payer: Self-pay

## 2022-11-06 ENCOUNTER — Encounter: Payer: Self-pay | Admitting: *Deleted

## 2022-11-06 NOTE — Telephone Encounter (Signed)
This encounter was created in error - please disregard.

## 2022-11-06 NOTE — Telephone Encounter (Signed)
Patient called back to request if Rx for metformin '1000mg'$  can be sent to pharmacy instead of 500 mg tablets.  In review of chart ,result noted from E. Andrews on 11/05/22 to increase metformin 1000 mg twice a day for better sugar control. Patient reports pharmacy requesting Rx. Please advise

## 2022-11-06 NOTE — Telephone Encounter (Signed)
Patient called, left VM to return the call to the office to discuss medication with a nurse. Metformin was sent yesterday '500mg'$  2 tabs daily. #180/1.   A1c increased to 7.4%, work on monitoring diet and can increase Metformin 1000 mg twice a day for better sugar control.   Summary: med ?   Patient called in states she was told Dr Rosana Berger would up the dosage on metformin 500 mg to 2000 mg, nut she still received 500 mg from pharmacy, please call back to discuss

## 2022-11-07 ENCOUNTER — Other Ambulatory Visit: Payer: Self-pay | Admitting: Internal Medicine

## 2022-11-07 DIAGNOSIS — E1165 Type 2 diabetes mellitus with hyperglycemia: Secondary | ICD-10-CM

## 2022-11-07 MED ORDER — METFORMIN HCL 1000 MG PO TABS: 1000.0000 mg | ORAL_TABLET | Freq: Two times a day (BID) | ORAL | 1 refills | Status: DC

## 2022-12-12 ENCOUNTER — Telehealth: Payer: Self-pay | Admitting: Cardiology

## 2022-12-12 MED ORDER — REPATHA 140 MG/ML ~~LOC~~ SOSY: 1.0000 "pen " | PREFILLED_SYRINGE | SUBCUTANEOUS | 1 refills | Status: DC

## 2022-12-12 NOTE — Telephone Encounter (Signed)
*  STAT* If patient is at the pharmacy, call can be transferred to refill team.   1. Which medications need to be refilled? (please list name of each medication and dose if known)   Evolocumab (REPATHA) 140 MG/ML SOSY    2. Which pharmacy/location (including street and city if local pharmacy) is medication to be sent to? [address] [facility] [place], [place] [address]   3. Do they need a 30 day or 90 day supply? [place]

## 2022-12-25 ENCOUNTER — Other Ambulatory Visit: Payer: Self-pay | Admitting: Nurse Practitioner

## 2022-12-25 DIAGNOSIS — I10 Essential (primary) hypertension: Secondary | ICD-10-CM

## 2022-12-26 NOTE — Telephone Encounter (Signed)
Requested Prescriptions  Pending Prescriptions Disp Refills   hydrochlorothiazide (HYDRODIURIL) 25 MG tablet [Pharmacy Med Name: hydroCHLOROthiazide 25MG  TABS*] 28 tablet     Sig: TAKE 1 TABLET BY MOUTH DAILY     Cardiovascular: Diuretics - Thiazide Passed - 12/25/2022  2:12 PM      Passed - Cr in normal range and within 180 days    Creat  Date Value Ref Range Status  11/02/2022 0.61 0.50 - 1.05 mg/dL Final   Creatinine, Urine  Date Value Ref Range Status  11/02/2022 52 20 - 275 mg/dL Final         Passed - K in normal range and within 180 days    Potassium  Date Value Ref Range Status  11/02/2022 4.4 3.5 - 5.3 mmol/L Final         Passed - Na in normal range and within 180 days    Sodium  Date Value Ref Range Status  11/02/2022 137 135 - 146 mmol/L Final  04/26/2022 137 134 - 144 mmol/L Final         Passed - Last BP in normal range    BP Readings from Last 1 Encounters:  11/02/22 130/72         Passed - Valid encounter within last 6 months    Recent Outpatient Visits           1 month ago Essential hypertension   [place], DO   8 months ago Essential hypertension   [place], Karen, NP   11 months ago Non-recurrent acute serous otitis media of right ear   [place], Henrine Screws T, NP   1 year ago Annual physical exam   [place] Jon Billings, NP   1 year ago Knuckle pads   [place], NP       Future Appointments             In 4 months Teodora Medici, [facility], [facility]             losartan (COZAAR) 50 MG tablet [Pharmacy Med Name: Losartan Potassium 50MG  TABS] 28 tablet     Sig: TAKE 1 TABLET BY MOUTH DAILY     Cardiovascular:  Angiotensin Receptor Blockers Passed - 12/25/2022  2:12 PM      Passed - Cr in normal range and  within 180 days    Creat  Date Value Ref Range Status  11/02/2022 0.61 0.50 - 1.05 mg/dL Final   Creatinine, Urine  Date Value Ref Range Status  11/02/2022 52 20 - 275 mg/dL Final         Passed - K in normal range and within 180 days    Potassium  Date Value Ref Range Status  11/02/2022 4.4 3.5 - 5.3 mmol/L Final         Passed - Patient is not pregnant      Passed - Last BP in normal range    BP Readings from Last 1 Encounters:  11/02/22 130/72         Passed - Valid encounter within last 6 months    Recent Outpatient Visits           1 month ago Essential hypertension   [place], DO   8 months ago Essential hypertension   [place],  Santiago Glad, NP   11 months ago Non-recurrent acute serous otitis media of right ear   [place] Venita Lick, NP   1 year ago Annual physical exam   [place] Jon Billings, NP   1 year ago Knuckle pads   [place] Jon Billings, NP       Future Appointments             In 4 months Teodora Medici, [facility], [facility]

## 2022-12-31 ENCOUNTER — Other Ambulatory Visit: Payer: Self-pay

## 2023-01-02 ENCOUNTER — Other Ambulatory Visit: Payer: Self-pay | Admitting: Pharmacist Clinician (PhC)/ Clinical Pharmacy Specialist

## 2023-01-02 MED ORDER — REPATHA 140 MG/ML ~~LOC~~ SOSY: 1.0000 "pen " | PREFILLED_SYRINGE | SUBCUTANEOUS | 1 refills | Status: DC

## 2023-01-03 ENCOUNTER — Telehealth: Payer: Self-pay | Admitting: Cardiology

## 2023-01-03 NOTE — Telephone Encounter (Signed)
Pt c/o medication issue:  1. Name of Medication:   Evolocumab (REPATHA) 140 MG/ML SOSY   2. How are you currently taking this medication (dosage and times per day)?  As prescribed  3. Are you having a reaction (difficulty breathing--STAT)?   4. What is your medication issue?   Patient stated she received an email from [facility] stating "her medication was on hold and to contact the doctor".  Patient provided the order# [id number].  Patient wants a call back on next steps.

## 2023-01-03 NOTE — Telephone Encounter (Signed)
Called pharmacy and it is too early to fill medication. She states that they will be able to fill medication on 01/12/23. Will update patient

## 2023-01-03 NOTE — Telephone Encounter (Signed)
Called patient back and reviewed that it was just too early for her to refill that medication. Advised that the refill date is 4/6 and states she has enough to last until then. Encouraged her to call back if she should have any further questions or needs.

## 2023-01-03 NOTE — Telephone Encounter (Signed)
Spoke with patient and she states mail delivery has a hold on medication. Advised that I will call them to see what the hold up is for that. She was appreciative

## 2023-01-22 ENCOUNTER — Other Ambulatory Visit: Payer: Self-pay | Admitting: Student

## 2023-01-22 DIAGNOSIS — M25561 Pain in right knee: Secondary | ICD-10-CM

## 2023-01-23 ENCOUNTER — Other Ambulatory Visit: Payer: Self-pay | Admitting: Nurse Practitioner

## 2023-01-24 NOTE — Telephone Encounter (Signed)
Requested Prescriptions  Pending Prescriptions Disp Refills   ezetimibe (ZETIA) 10 MG tablet [Pharmacy Med Name: Ezetimibe  TABS] 90 tablet 0    Sig: TAKE 1 TABLET BY MOUTH DAILY     Cardiovascular:  Antilipid - Sterol Transport Inhibitors Failed - 01/23/2023 12:49 PM      Failed - ALT in normal range and within 360 days    ALT  Date Value Ref Range Status  11/02/2022 34 (H) 6 - 29 U/L Final         Failed - Lipid Panel in normal range within the last 12 months    Cholesterol, Total  Date Value Ref Range Status  04/26/2022 147 100 - 199 mg/dL Final   Cholesterol  Date Value Ref Range Status  05/09/2022 97 0 - 200 mg/dL Final   LDL Cholesterol (Calc)  Date Value Ref Range Status  08/16/2017 171 (H) mg/dL (calc) Final    Comment:    Reference range: <100 . Desirable range <100 mg/dL for primary prevention;   <70 mg/dL for patients with CHD or diabetic patients  with > or = 2 CHD risk factors. Marland Kitchen LDL-C is now calculated using the Martin-Hopkins  calculation, which is a validated novel method providing  better accuracy than the Friedewald equation in the  estimation of LDL-C.  Horald Pollen et al. Lenox Ahr. 1610;960(45): 2061-2068  (http://education.[employer].com/faq/FAQ164)    LDL Chol Calc (NIH)  Date Value Ref Range Status  04/26/2022 24 0 - 99 mg/dL Final   LDL Cholesterol  Date Value Ref Range Status  05/09/2022 16 0 - 99 mg/dL Final    Comment:           Total Cholesterol/HDL:CHD Risk Coronary Heart Disease Risk Table                     Men   Women  1/2 Average Risk   3.4   3.3  Average Risk       5.0   4.4  2 X Average Risk   9.6   7.1  3 X Average Risk  23.4   11.0        Use the calculated Patient Ratio above and the CHD Risk Table to determine the patient's CHD Risk.        ATP III CLASSIFICATION (LDL):  <100     mg/dL   Optimal  409-811  mg/dL   Near or Above                    Optimal  130-159  mg/dL   Borderline  914-782  mg/dL   High   >956     mg/dL   Very High Performed at [facility], [address] [place], [place] [address]    HDL  Date Value Ref Range Status  05/09/2022 42 >40 mg/dL Final  65/78/4696 41 >29 mg/dL Final   Triglycerides  Date Value Ref Range Status  05/09/2022 197 (H) <150 mg/dL Final         Passed - AST in normal range and within 360 days    AST  Date Value Ref Range Status  11/02/2022 19 10 - 35 U/L Final         Passed - Patient is not pregnant      Passed - Valid encounter within last 12 months    Recent Outpatient Visits           2 months ago Essential hypertension  [facility] Margarita Mail, [place]   9 months ago Essential hypertension   [place] [facility] Larae Grooms, NP   12 months ago Non-recurrent acute serous otitis media of right ear   [place] [facility] Marjie Skiff, NP   1 year ago Annual physical exam   [place] [facility] Larae Grooms, NP   1 year ago Knuckle pads   [place] [facility] Larae Grooms, NP       Future Appointments             In 3 months Margarita Mail, DO [facility], [facility]

## 2023-01-25 ENCOUNTER — Other Ambulatory Visit: Payer: Self-pay | Admitting: Student

## 2023-01-25 DIAGNOSIS — M25561 Pain in right knee: Secondary | ICD-10-CM

## 2023-01-31 ENCOUNTER — Ambulatory Visit: Payer: 59

## 2023-02-04 ENCOUNTER — Ambulatory Visit: Payer: Self-pay | Admitting: *Deleted

## 2023-02-04 NOTE — Telephone Encounter (Signed)
Message from [place] sent at 02/04/2023  8:57 AM EDT  Summary: cold and flu like symptoms / rx req   The patient has tested positive for COVID 19 via an at home test this morning 02/04/23  The patient shares that they are experiencing sinus discomfort and tiredness  The patient would like to be prescribed something for their symptoms  Please contact further when possible          Call History   Type Contact Phone/Fax User  02/04/2023 08:56 AM EDT Phone (Incoming) [name], Debra Eber (Self) [phone] Judie Petit) Coley, Everette A

## 2023-02-04 NOTE — Telephone Encounter (Signed)
Second attempt to call patient- left message to call office. 

## 2023-02-04 NOTE — Telephone Encounter (Signed)
Attempted to return her call.   Left a voicemail to call back. 

## 2023-02-04 NOTE — Telephone Encounter (Signed)
3rd attempt to return her call without success.   Left a voicemail for her to call back.     Per policy I have forwarded her chart/message to [facility] for Dr. Caralee Ates.

## 2023-02-09 ENCOUNTER — Other Ambulatory Visit: Payer: 59

## 2023-02-11 ENCOUNTER — Ambulatory Visit
Admission: RE | Admit: 2023-02-11 | Discharge: 2023-02-11 | Disposition: A | Discharge status: Home / Self Care | Payer: 59 | Source: Ambulatory Visit | Attending: Student | Admitting: Student

## 2023-02-11 DIAGNOSIS — M25561 Pain in right knee: Secondary | ICD-10-CM

## 2023-02-18 ENCOUNTER — Other Ambulatory Visit: Payer: 59

## 2023-02-19 ENCOUNTER — Other Ambulatory Visit: Payer: Self-pay | Admitting: Orthopedic Surgery

## 2023-02-25 ENCOUNTER — Encounter: Payer: Self-pay | Admitting: Orthopedic Surgery

## 2023-02-25 NOTE — Anesthesia Preprocedure Evaluation (Addendum)
Anesthesia Evaluation  Patient identified by MRN, date of birth, ID band Patient awake    Reviewed: Allergy & Precautions, H&P , NPO status , Patient's Chart, lab work & pertinent test results  Airway Mallampati: III  TM Distance: >3 FB Neck ROM: Full    Dental no notable dental hx.    Pulmonary former smoker   Pulmonary exam normal breath sounds clear to auscultation       Cardiovascular hypertension, Normal cardiovascular exam Rhythm:Regular Rate:Normal  Echo 06-10-20 EF 60-65%, Grade I diastolic dysfunction   Neuro/Psych  Neuromuscular disease negative neurological ROS  negative psych ROS   GI/Hepatic negative GI ROS, Neg liver ROS,GERD  ,,  Endo/Other  negative endocrine ROSdiabetes    Renal/GU negative Renal ROS  negative genitourinary   Musculoskeletal negative musculoskeletal ROS (+)    Abdominal   Peds negative pediatric ROS (+)  Hematology negative hematology ROS (+)   Anesthesia Other Findings Lumbar back pain  Pre-diabetes Hypertension Hyperlipidemia Allergies  GERD (gastroesophageal reflux disease) Borderline diabetes  Grade I diastolic dysfunction    Reproductive/Obstetrics negative OB ROS                              Anesthesia Physical Anesthesia Plan  ASA: 3  Anesthesia Plan:    Post-op Pain Management: Regional block   Induction: Intravenous  PONV Risk Score and Plan:   Airway Management Planned: LMA  Additional Equipment:   Intra-op Plan:   Post-operative Plan: Extubation in OR  Informed Consent: I have reviewed the patients History and Physical, chart, labs and discussed the procedure including the risks, benefits and alternatives for the proposed anesthesia with the patient or authorized representative who has indicated his/her understanding and acceptance.     Dental Advisory Given  Plan Discussed with: Anesthesiologist, CRNA and  Surgeon  Anesthesia Plan Comments: (Patient consented for risks of anesthesia including but not limited to:  - adverse reactions to medications - damage to eyes, teeth, lips or other oral mucosa - nerve damage due to positioning  - sore throat or hoarseness - Damage to heart, brain, nerves, lungs, other parts of body or loss of life  Patient voiced understanding.)         Anesthesia Quick Evaluation

## 2023-02-27 ENCOUNTER — Ambulatory Visit: Payer: 59 | Attending: Nurse Practitioner | Admitting: Nurse Practitioner

## 2023-02-27 ENCOUNTER — Telehealth: Payer: Self-pay | Admitting: Cardiology

## 2023-02-27 ENCOUNTER — Telehealth: Payer: Self-pay | Admitting: *Deleted

## 2023-02-27 DIAGNOSIS — Z01810 Encounter for preprocedural cardiovascular examination: Secondary | ICD-10-CM

## 2023-02-27 NOTE — Telephone Encounter (Signed)
   Patient Name: Debra [name]  DOB: [birth year] MRN: [id number]  Primary Cardiologist: Debbe Odea, MD  Chart reviewed as part of pre-operative protocol coverage.  Patient was contacted for scheduled preop telephone visit.  However, at time of visit patient declined telephone visit for medical clearance, stating she did not wish to pay a co-pay.  She states "I only need to make sure I can take my Repatha before surgery."  I advised patient that there is no contraindication to taking her Repatha prior to surgery.  I will route this recommendation to the requesting party via Epic fax function and remove from pre-op pool.  Please call with questions.  Joylene Grapes, NP 02/27/2023, 4:38 PM

## 2023-02-27 NOTE — Telephone Encounter (Signed)
   Pre-operative Risk Assessment    Patient Name: Debra [name]  DOB: 05-Jun-1956 MRN: [id number]      Request for Surgical Clearance    Procedure:  Right medial meniscus root repair, chondroplasty   Date of Surgery:  Clearance 03/01/23                                 Surgeon:  Dr. Signa Kell  Surgeon's Group or Practice Name:  [facility] Phone number:  [phone] Fax number:  [phone]   Type of Clearance Requested:   - Medical  - Pharmacy:  Hold TBD  by Cardiology   Type of Anesthesia:   Choice   Additional requests/questions:    Minna Antis   02/27/2023, 10:29 AM

## 2023-02-27 NOTE — Telephone Encounter (Signed)
   Name: Debra [name]  DOB: 1956-09-17  MRN: [id number]  Primary Cardiologist: Debbe Odea, MD   Preoperative team, please contact this patient and set up a phone call appointment for further preoperative risk assessment. Please obtain consent and complete medication review. Thank you for your help.  I confirm that guidance regarding antiplatelet and oral anticoagulation therapy has been completed and, if necessary, noted below (none requested).    Joylene Grapes, NP 02/27/2023, 10:37 AM [facility]

## 2023-02-27 NOTE — Telephone Encounter (Signed)
Pt has been added to pre op schedule today 3:20 ok per pre op APP Bernadene Person, NP. Pt tells me that the surgeon wanted to make sure she can take her Repatha Sunday. I stated I believe that is fine as it is not a blood thinner, though I will make note of this to confirm. Med rec and consent are done.

## 2023-02-27 NOTE — Telephone Encounter (Signed)
Pt has been added to pre op schedule today 3:20 ok per pre op APP Bernadene Person, NP. Pt tells me that the surgeon wanted to make sure she can take her Repatha Sunday. I stated I believe that is fine as it is not a blood thinner, though I will make note of this to confirm. Med rec and consent are done.     Patient Consent for Virtual Visit        MOET DEBRUYNE has provided verbal consent on 02/27/2023 for a virtual visit (video or telephone).   CONSENT FOR VIRTUAL VISIT FOR:  Concha Norway  By participating in this virtual visit I agree to the following:  I hereby voluntarily request, consent and authorize [facility] and its employed or contracted physicians, physician assistants, nurse practitioners or other licensed health care professionals (the Practitioner), to provide me with telemedicine health care services (the "Services") as deemed necessary by the treating Practitioner. I acknowledge and consent to receive the Services by the Practitioner via telemedicine. I understand that the telemedicine visit will involve communicating with the Practitioner through live audiovisual communication technology and the disclosure of certain medical information by electronic transmission. I acknowledge that I have been given the opportunity to request an in-person assessment or other available alternative prior to the telemedicine visit and am voluntarily participating in the telemedicine visit.  I understand that I have the right to withhold or withdraw my consent to the use of telemedicine in the course of my care at any time, without affecting my right to future care or treatment, and that the Practitioner or I may terminate the telemedicine visit at any time. I understand that I have the right to inspect all information obtained and/or recorded in the course of the telemedicine visit and may receive copies of available information for a reasonable fee.  I understand that some of the potential  risks of receiving the Services via telemedicine include:  Delay or interruption in medical evaluation due to technological equipment failure or disruption; Information transmitted may not be sufficient (e.g. poor resolution of images) to allow for appropriate medical decision making by the Practitioner; and/or  In rare instances, security protocols could fail, causing a breach of personal health information.  Furthermore, I acknowledge that it is my responsibility to provide information about my medical history, conditions and care that is complete and accurate to the best of my ability. I acknowledge that Practitioner's advice, recommendations, and/or decision may be based on factors not within their control, such as incomplete or inaccurate data provided by me or distortions of diagnostic images or specimens that may result from electronic transmissions. I understand that the practice of medicine is not an exact science and that Practitioner makes no warranties or guarantees regarding treatment outcomes. I acknowledge that a copy of this consent can be made available to me via my patient portal [facility] MyChart), or I can request a printed copy by calling the office of [facility].    I understand that my insurance will be billed for this visit.   I have read or had this consent read to me. I understand the contents of this consent, which adequately explains the benefits and risks of the Services being provided via telemedicine.  I have been provided ample opportunity to ask questions regarding this consent and the Services and have had my questions answered to my satisfaction. I give my informed consent for the services to be provided through the use  of telemedicine in my medical care

## 2023-02-27 NOTE — Progress Notes (Deleted)
Virtual Visit via Telephone Note   Because of Debra [name]'s co-morbid illnesses, she is at least at moderate risk for complications without adequate follow up.  This format is felt to be most appropriate for this patient at this time.  The patient did not have access to video technology/had technical difficulties with video requiring transitioning to audio format only (telephone).  All issues noted in this document were discussed and addressed.  No physical exam could be performed with this format.  Please refer to the patient's chart for her consent to telehealth for [facility].  Evaluation Performed:  Preoperative cardiovascular risk assessment _____________   Date:  02/27/2023   Patient ID:  CIIN OBRYAN, DOB 05-21-1956, MRN [id number] Patient Location:  Home Provider location:   Office  Primary Care Provider:  Margarita Mail, DO Primary Cardiologist:  Debbe Odea, MD  Chief Complaint / Patient Profile   67 y.o. y/o female with a h/o hypertension and hyperlipidemia who is pending right medial meniscus root repair, chondroplasty on 03/01/2023 with Dr. Signa Kell of [facility] and presents today for telephonic preoperative cardiovascular risk assessment.  History of Present Illness    Debra [name] is a 67 y.o. female who presents via audio/video conferencing for a telehealth visit today.  Pt was last seen in cardiology clinic on 8/18/203 by Dr. Azucena Cecil.  At that time DANAHI FAVARO was doing well.  The patient is now pending procedure as outlined above. Since her last visit, she ***  She denies chest pain, palpitations, dyspnea, pnd, orthopnea, n, v, dizziness, syncope, edema, weight gain, or early satiety. All other systems reviewed and are otherwise negative except as noted above.   Past Medical History    Past Medical History:  Diagnosis Date   Allergies    Borderline diabetes 03/30/2015   GERD (gastroesophageal reflux disease)     Grade I diastolic dysfunction    Hyperlipidemia    Hypertension    Lumbar back pain    Pre-diabetes    during pregnancy    Past Surgical History:  Procedure Laterality Date   CARPAL TUNNEL RELEASE Right    COLONOSCOPY WITH PROPOFOL N/A 12/30/2020   Procedure: COLONOSCOPY WITH PROPOFOL;  Surgeon: Midge Minium, MD;  Location: [facility];  Service: Endoscopy;  Laterality: N/A;   LUMBAR LAMINECTOMY/DECOMPRESSION MICRODISCECTOMY Left 10/16/2017   Procedure: Microlumbar decompression L4-5, L5-S1 left;  Surgeon: Jene Every, MD;  Location: [facility];  Service: Orthopedics;  Laterality: Left;  120 mins   PLANTAR FASCIA RELEASE Left    POLYPECTOMY  12/30/2020   Procedure: POLYPECTOMY;  Surgeon: Midge Minium, MD;  Location: [facility];  Service: Endoscopy;;   RHINOPLASTY  1988   SHOULDER SURGERY Right 2011   TEMPOROMANDIBULAR JOINT SURGERY  1990   TOTAL ABDOMINAL HYSTERECTOMY  1998    Allergies  Allergies  Allergen Reactions   Fenofibrate Other (See Comments)    Severe back pain   Latex     Bandaids only   Nexlizet [Bempedoic Acid-Ezetimibe]     Feels "spaced out and has joint aches"   Other Itching    Seasonal    Pravastatin Sodium     Other reaction(s): Joint Pains   Shellfish Allergy Swelling    Face, eyes, ears, all swelled. ER gave her Epi pen.     Home Medications    Prior to Admission medications   Medication Sig Start Date End Date Taking? Authorizing Provider  acetaminophen (TYLENOL) 650 MG CR  tablet Take 650 mg by mouth every 8 (eight) hours as needed for pain.    [provider]  Biotin 1000 MCG CHEW Chew 1 each by mouth daily.    [provider]  cyanocobalamin (VITAMIN B12) 1000 MCG tablet Take 1,000 mcg by mouth daily.    [provider]  EPINEPHrine 0.3 mg/0.3 mL IJ SOAJ injection INJECT 1 PEN IN THIGH MUSCLE ONE TIME FOR ALLERGIC REACTION. MAY REPEAT IN 5 TO 15 MINUTES IF NEEDED 02/05/20   [provider]   Evolocumab (REPATHA) 140 MG/ML SOSY Inject 1 pen  into the skin every 14 (fourteen) days. 01/02/23   Debbe Odea, MD  ezetimibe (ZETIA) 10 MG tablet TAKE 1 TABLET BY MOUTH DAILY 01/24/23   Margarita Mail, DO  fexofenadine (ALLEGRA) 180 MG tablet Take 180 mg by mouth daily.    [provider]  hydrochlorothiazide (HYDRODIURIL) 25 MG tablet TAKE 1 TABLET BY MOUTH DAILY 12/26/22   Larae Grooms, NP  losartan (COZAAR) 50 MG tablet TAKE 1 TABLET BY MOUTH DAILY 12/26/22   Larae Grooms, NP  Melatonin 10 MG TABS Take 1 tablet by mouth at bedtime.     [provider]  metFORMIN (GLUCOPHAGE) 1000 MG tablet Take 1 tablet (1,000 mg total) by mouth 2 (two) times daily with a meal. 11/07/22   Margarita Mail, DO  omeprazole (PRILOSEC) 40 MG capsule TAKE 1 CAPSULE(20 MG) BY MOUTH DAILY 10/11/22   Larae Grooms, NP    Physical Exam    Vital Signs:  SHADAVA CLARIZIO does not have vital signs available for review today.***  Given telephonic nature of communication, physical exam is limited. AAOx3. NAD. Normal affect.  Speech and respirations are unlabored.  Accessory Clinical Findings    None  Assessment & Plan    1.  Preoperative Cardiovascular Risk Assessment:  The patient was advised that if she develops new symptoms prior to surgery to contact our office to arrange for a follow-up visit, and she verbalized understanding.  (Reminder: Include SBE prophylaxis/Antiplatelet/Anticoag Instructions***)  A copy of this note will be routed to requesting surgeon.  Time:   Today, I have spent *** minutes with the patient with telehealth technology discussing medical history, symptoms, and management plan.     Joylene Grapes, NP  02/27/2023, 2:28 PM

## 2023-03-01 ENCOUNTER — Other Ambulatory Visit: Payer: Self-pay

## 2023-03-01 ENCOUNTER — Ambulatory Visit
Admission: RE | Admit: 2023-03-01 | Discharge: 2023-03-01 | Disposition: A | Discharge status: Home / Self Care | Payer: 59 | Source: Ambulatory Visit | Attending: Orthopedic Surgery | Admitting: Orthopedic Surgery

## 2023-03-01 ENCOUNTER — Ambulatory Visit: Payer: 59 | Admitting: Anesthesiology

## 2023-03-01 ENCOUNTER — Encounter
Admission: RE | Disposition: A | Discharge status: Home / Self Care | Payer: Self-pay | Source: Ambulatory Visit | Attending: Orthopedic Surgery

## 2023-03-01 ENCOUNTER — Encounter: Payer: Self-pay | Admitting: Orthopedic Surgery

## 2023-03-01 DIAGNOSIS — M1711 Unilateral primary osteoarthritis, right knee: Secondary | ICD-10-CM | POA: Insufficient documentation

## 2023-03-01 DIAGNOSIS — E119 Type 2 diabetes mellitus without complications: Secondary | ICD-10-CM | POA: Insufficient documentation

## 2023-03-01 DIAGNOSIS — S83241A Other tear of medial meniscus, current injury, right knee, initial encounter: Secondary | ICD-10-CM | POA: Diagnosis present

## 2023-03-01 DIAGNOSIS — Z09 Encounter for follow-up examination after completed treatment for conditions other than malignant neoplasm: Secondary | ICD-10-CM | POA: Diagnosis not present

## 2023-03-01 DIAGNOSIS — Z7984 Long term (current) use of oral hypoglycemic drugs: Secondary | ICD-10-CM | POA: Diagnosis not present

## 2023-03-01 DIAGNOSIS — Z87891 Personal history of nicotine dependence: Secondary | ICD-10-CM | POA: Diagnosis not present

## 2023-03-01 DIAGNOSIS — X58XXXA Exposure to other specified factors, initial encounter: Secondary | ICD-10-CM | POA: Insufficient documentation

## 2023-03-01 HISTORY — DX: Other ill-defined heart diseases: I51.89

## 2023-03-01 HISTORY — PX: KNEE ARTHROSCOPY WITH MENISCAL REPAIR: SHX5653

## 2023-03-01 HISTORY — PX: CHONDROPLASTY: SHX5177

## 2023-03-01 LAB — GLUCOSE, CAPILLARY: Glucose-Capillary: 101 mg/dL — ABNORMAL HIGH (ref 70–99)

## 2023-03-01 SURGERY — ARTHROSCOPY, KNEE, WITH MENISCUS REPAIR
Anesthesia: Choice | Site: Knee | Laterality: Right

## 2023-03-01 MED ORDER — EPHEDRINE SULFATE (PRESSORS) 50 MG/ML IJ SOLN
INTRAMUSCULAR | Status: DC | PRN
  Administered 2023-03-01 (×2): 5 mg via INTRAVENOUS

## 2023-03-01 MED ORDER — FENTANYL CITRATE (PF) 100 MCG/2ML IJ SOLN: 100.0000 ug | Freq: Once | INTRAMUSCULAR | Status: DC

## 2023-03-01 MED ORDER — FENTANYL CITRATE PF 50 MCG/ML IJ SOSY
25.0000 ug | PREFILLED_SYRINGE | Freq: Once | INTRAMUSCULAR | Status: AC
  Administered 2023-03-01: 25 ug via INTRAVENOUS

## 2023-03-01 MED ORDER — PROPOFOL 10 MG/ML IV BOLUS
INTRAVENOUS | Status: DC | PRN
  Administered 2023-03-01: 170 mg via INTRAVENOUS
  Administered 2023-03-01: 30 mg via INTRAVENOUS

## 2023-03-01 MED ORDER — CEFAZOLIN SODIUM-DEXTROSE 2-4 GM/100ML-% IV SOLN
2.0000 g | INTRAVENOUS | Status: AC
  Administered 2023-03-01: 2 g via INTRAVENOUS

## 2023-03-01 MED ORDER — LACTATED RINGERS IR SOLN
Status: DC | PRN
  Administered 2023-03-01: 9000 mL

## 2023-03-01 MED ORDER — OXYCODONE HCL 5 MG PO TABS: 5.0000 mg | ORAL_TABLET | ORAL | 0 refills | Status: DC | PRN

## 2023-03-01 MED ORDER — ONDANSETRON 4 MG PO TBDP: 4.0000 mg | ORAL_TABLET | Freq: Three times a day (TID) | ORAL | 0 refills | Status: DC | PRN

## 2023-03-01 MED ORDER — ACETAMINOPHEN 500 MG PO TABS: 1000.0000 mg | ORAL_TABLET | Freq: Three times a day (TID) | ORAL | 2 refills | Status: AC

## 2023-03-01 MED ORDER — LACTATED RINGERS IV SOLN: INTRAVENOUS | Status: DC

## 2023-03-01 MED ORDER — MIDAZOLAM HCL 2 MG/2ML IJ SOLN
2.0000 mg | Freq: Once | INTRAMUSCULAR | Status: AC
  Administered 2023-03-01: 2 mg via INTRAVENOUS

## 2023-03-01 MED ORDER — IBUPROFEN 800 MG PO TABS: 800.0000 mg | ORAL_TABLET | Freq: Three times a day (TID) | ORAL | 0 refills | Status: AC

## 2023-03-01 MED ORDER — LIDOCAINE HCL (CARDIAC) PF 100 MG/5ML IV SOSY
PREFILLED_SYRINGE | INTRAVENOUS | Status: DC | PRN
  Administered 2023-03-01: 80 mg via INTRATRACHEAL

## 2023-03-01 MED ORDER — FENTANYL CITRATE (PF) 100 MCG/2ML IJ SOLN
100.0000 ug | Freq: Once | INTRAMUSCULAR | Status: AC
  Administered 2023-03-01: 100 ug via INTRAVENOUS

## 2023-03-01 MED ORDER — FENTANYL CITRATE (PF) 100 MCG/2ML IJ SOLN
INTRAMUSCULAR | Status: DC | PRN
  Administered 2023-03-01 (×2): 25 ug via INTRAVENOUS
  Administered 2023-03-01: 50 ug via INTRAVENOUS

## 2023-03-01 MED ORDER — DEXAMETHASONE SODIUM PHOSPHATE 4 MG/ML IJ SOLN
INTRAMUSCULAR | Status: DC | PRN
  Administered 2023-03-01: 4 mg via INTRAVENOUS

## 2023-03-01 MED ORDER — BUPIVACAINE HCL (PF) 0.25 % IJ SOLN
INTRAMUSCULAR | Status: DC | PRN
  Administered 2023-03-01: 3 mL

## 2023-03-01 MED ORDER — DEXMEDETOMIDINE HCL IN NACL 400 MCG/100ML IV SOLN
INTRAVENOUS | Status: DC | PRN
  Administered 2023-03-01 (×3): 4 ug via INTRAVENOUS

## 2023-03-01 MED ORDER — ASPIRIN 325 MG PO TBEC: 325.0000 mg | DELAYED_RELEASE_TABLET | Freq: Every day | ORAL | 0 refills | Status: AC

## 2023-03-01 MED ORDER — MIDAZOLAM HCL 5 MG/5ML IJ SOLN
INTRAMUSCULAR | Status: DC | PRN
  Administered 2023-03-01 (×2): 1 mg via INTRAVENOUS

## 2023-03-01 MED ORDER — OXYCODONE HCL 5 MG PO TABS
10.0000 mg | ORAL_TABLET | Freq: Once | ORAL | Status: AC
  Administered 2023-03-01: 10 mg via ORAL

## 2023-03-01 SURGICAL SUPPLY — 48 items
ADH SKN CLS APL DERMABOND .7 (GAUZE/BANDAGES/DRESSINGS) ×1
ADPR IRR PORT MULTIBAG TUBE (MISCELLANEOUS) ×1
APL PRP STRL LF DISP 70% ISPRP (MISCELLANEOUS) ×1
BLADE FULL RADIUS 3.5 (BLADE) ×1 IMPLANT
BLADE SHAVER 4.5X7 STR FR (MISCELLANEOUS) ×1 IMPLANT
BLADE SURG 15 STRL LF DISP TIS (BLADE) ×1 IMPLANT
BLADE SURG 15 STRL SS (BLADE) ×1
BLADE SURG SZ11 CARB STEEL (BLADE) ×1 IMPLANT
BNDG CMPR 5X4 CHSV STRCH STRL (GAUZE/BANDAGES/DRESSINGS) ×1
BNDG COHESIVE 4X5 TAN STRL LF (GAUZE/BANDAGES/DRESSINGS) IMPLANT
BNDG ESMARCH 6 X 12 STRL LF (GAUZE/BANDAGES/DRESSINGS) ×1
BNDG ESMARCH 6X12 STRL LF (GAUZE/BANDAGES/DRESSINGS) IMPLANT
CHLORAPREP W/TINT 26 (MISCELLANEOUS) ×1 IMPLANT
COOLER POLAR GLACIER W/PUMP (MISCELLANEOUS) ×1 IMPLANT
COVER LIGHT HANDLE UNIVERSAL (MISCELLANEOUS) ×2 IMPLANT
DERMABOND ADVANCED .7 DNX12 (GAUZE/BANDAGES/DRESSINGS) IMPLANT
DRAPE EXTREMITY T 121X128X90 (DISPOSABLE) ×1 IMPLANT
DRAPE IMP U-DRAPE 54X76 (DRAPES) ×1 IMPLANT
GAUZE SPONGE 4X4 12PLY STRL (GAUZE/BANDAGES/DRESSINGS) ×1 IMPLANT
GLOVE SURG ENC MOIS LTX SZ7.5 (GLOVE) ×2 IMPLANT
GLOVE SURG UNDER LTX SZ8 (GLOVE) ×1 IMPLANT
GOWN STRL REUS W/ TWL LRG LVL3 (GOWN DISPOSABLE) ×1 IMPLANT
GOWN STRL REUS W/TWL LRG LVL3 (GOWN DISPOSABLE) ×3
IV LACTATED RINGER IRRG 3000ML (IV SOLUTION) ×4
IV LR IRRIG 3000ML ARTHROMATIC (IV SOLUTION) ×4 IMPLANT
KIT ROOT REPAIR MEINISCAL PEEK (Anchor) IMPLANT
KIT SUTLOC MENISCAL ROOT REP (Anchor) IMPLANT
KIT TURNOVER KIT A (KITS) ×1 IMPLANT
MANIFOLD NEPTUNE II (INSTRUMENTS) ×1 IMPLANT
MAT ABSORB  FLUID 56X50 GRAY (MISCELLANEOUS) ×2
MAT ABSORB FLUID 56X50 GRAY (MISCELLANEOUS) ×2 IMPLANT
MEINISCAL ROOT REPAIR KIT PEEK (Anchor) ×1 IMPLANT
PACK ARTHROSCOPY KNEE (MISCELLANEOUS) ×1 IMPLANT
PAD ABD DERMACEA PRESS 5X9 (GAUZE/BANDAGES/DRESSINGS) ×2 IMPLANT
PAD WRAPON POLAR KNEE (MISCELLANEOUS) ×1 IMPLANT
PADDING CAST BLEND 6X4 STRL (MISCELLANEOUS) ×1 IMPLANT
PENCIL SMOKE EVACUATOR (MISCELLANEOUS) IMPLANT
SET Y ADAPTER MULIT-BAG IRRIG (MISCELLANEOUS) ×2 IMPLANT
SUT ETHILON 3 0 FSLX (SUTURE) ×1 IMPLANT
SUT MNCRL 4-0 (SUTURE) ×1
SUT MNCRL 4-0 27XMFL (SUTURE) ×1
SUT VIC AB 2-0 CT1 27 (SUTURE) ×1
SUT VIC AB 2-0 CT1 TAPERPNT 27 (SUTURE) IMPLANT
SUTURE MNCRL 4-0 27XMF (SUTURE) IMPLANT
TUBING INFLOW SET DBFLO PUMP (TUBING) ×1 IMPLANT
TUBING OUTFLOW SET DBLFO PUMP (TUBING) ×1 IMPLANT
WAND WEREWOLF FLOW 90D (MISCELLANEOUS) ×1 IMPLANT
WRAPON POLAR PAD KNEE (MISCELLANEOUS) ×1

## 2023-03-01 NOTE — Anesthesia Procedure Notes (Signed)
Procedure Name: LMA Insertion Date/Time: 03/01/2023 11:46 AM  Performed by: Barbette Hair, CRNAPre-anesthesia Checklist: Patient identified, Emergency Drugs available, Suction available, Patient being monitored and Timeout performed Patient Re-evaluated:Patient Re-evaluated prior to induction Oxygen Delivery Method: Circle system utilized Preoxygenation: Pre-oxygenation with 100% oxygen Induction Type: IV induction LMA: LMA inserted LMA Size: 4.0 Number of attempts: 1 Dental Injury: Teeth and Oropharynx as per pre-operative assessment

## 2023-03-01 NOTE — Progress Notes (Signed)
Assisted [place] ANMD with right, adductor canal, transabdominal plane block. Side rails up, monitors on throughout procedure. See vital signs in flow sheet. Tolerated Procedure well.

## 2023-03-01 NOTE — Discharge Instructions (Signed)
Arthroscopic Knee Surgery - Meniscus Repair   Post-Op Instructions   1. Bracing or crutches: Crutches will be provided at the time of discharge from the surgery center. Keep brace locked in extension at all times except as directed by physical therapy.    2. Ice: You may be provided with a device (Polar Care) that allows you to ice the affected area effectively. Otherwise you can ice manually.    3. Driving:  Plan on not driving for at least four weeks. Please note that you are advised NOT to drive while taking narcotic pain medications as you may be impaired and unsafe to drive.   4. Activity: Ankle pumps several times an hour while awake to prevent blood clots. Weight bearing: NO WEIGHT BEARING FOR 4 WEEKS. Use crutches for at least 4 weeks, if not 6 based on your surgery. Bending and straightening the knee is unlimited, but do not flex your knee past 90 degrees until cleared by your therapist. Elevate knee above heart level as much as possible for one week. Avoid standing more than 5 minutes (consecutively) for the first week. No exercise involving the knee until cleared by the surgeon or physical therapist.  Avoid long distance travel for 4 weeks.   5. Medications:  - You have been provided a prescription for narcotic pain medicine. After surgery, take 1-2 narcotic tablets every 4 hours if needed for severe pain. If it has tylenol (acetaminophen), please do not take a total of more than 3000mg/day of tylenol.  - A prescription for anti-nausea medication will be provided in case the narcotic medicine causes nausea - take 1 tablet every 6 hours only if nauseated.  - Take ibuprofen 800 mg every 8 hours with food to reduce post-operative knee swelling. DO NOT STOP IBUPROFEN POST-OP UNTIL INSTRUCTED TO DO SO at first post-op office visit (10-14 days after surgery).  - Take enteric coated aspirin 325 mg once daily for 4 weeks to prevent blood clots.  -Take tylenol 1000 every 8 hours for pain.  May  stop tylenol 3 days after surgery or when you are having minimal pain. If your narcotic has tylenol (acetaminophen), please do not take a total of more than 3000mg/day of tylenol.    If you are taking prescription medication for anxiety, depression, insomnia, muscle spasm, chronic pain, or for attention deficit disorder you are advised that you are at a higher risk of adverse effects with use of narcotics post-op, including narcotic addiction/dependence, depressed breathing, death. If you use non-prescribed substances: alcohol, marijuana, cocaine, heroin, methamphetamines, etc., you are at a higher risk of adverse effects with use of narcotics post-op, including narcotic addiction/dependence, depressed breathing, death. You are advised that taking > 50 morphine milligram equivalents (MME) of narcotic pain medication per day results in twice the risk of overdose or death. For your prescription provided: oxycodone 5 mg - taking more than 6 tablets per day. Be advised that we will prescribe narcotics short-term, for acute post-operative pain only - 1 week for minor operations such as knee arthroscopy for meniscus tear resection, and 3 weeks for major operations such as knee repair/reconstruction surgeries.   6. Bandages: The physical therapist should change the bandages at the first post-op appointment. If needed, the dressing supplies have been provided to you. You may shower after this with waterproof bandaids covering the incisions.    7. Physical Therapy: 2 times per week for the first 4 weeks, then 1-2 times per week from weeks 4-8 post-op. Therapy typically   starts on post operative Day 3 or 4. You have been provided an order for physical therapy. The therapist will provide home exercises.   8. Work: May return to full work when off of crutches. May do light duty/desk job in approximately 1-2 weeks when off of narcotics, pain is well-controlled, and swelling has decreased.   9. Post-Op  Appointments: Your first post-op appointment will be with Dr. Jerusha Reising in approximately 2 weeks time.    If you find that they have not been scheduled please call the Orthopaedic Appointment front desk at [phone].   

## 2023-03-01 NOTE — Transfer of Care (Signed)
Immediate Anesthesia Transfer of Care Note  Patient: Debra [name]  Procedure(s) Performed: Right medial meniscus root repair, chondroplasty (Right: Knee) Right medial meniscus root repair, chondroplasty (Right: Knee)  Patient Location: PACU  Anesthesia Type: No value filed.  Level of Consciousness: awake, alert  and patient cooperative  Airway and Oxygen Therapy: Patient Spontanous Breathing and Patient connected to supplemental oxygen  Post-op Assessment: Post-op Vital signs reviewed, Patient's Cardiovascular Status Stable, Respiratory Function Stable, Patent Airway and No signs of Nausea or vomiting  Post-op Vital Signs: Reviewed and stable  Complications: No notable events documented.

## 2023-03-01 NOTE — Op Note (Signed)
DATE: 03/01/2023   PRE-OP DIAGNOSIS:  1. Right medial meniscus root tear 2. Right Medial compartment degenerative changes   POST-OP DIAGNOSIS:  1. Right medial meniscus root tear 2. Right Medial compartment degenerative changes  PROCEDURES:  1. Right knee medial meniscus root repair  2. Right medial femoral condyle chondroplasty     SURGEON:  Novella Olive, MD   ASSISTANT(S):  Dedra Skeens, [place]   ANESTHESIA: regional adductor canal block + general   TOTAL IV FLUIDS: See anesthesia record   ESTIMATED BLOOD LOSS: 10cc   TOURNIQUET TIME:  55 min   DRAINS:  None.   SPECIMENS: None   IMPLANTS:  - Arthrex Biocomposite SwiveLock (x1)     COMPLICATIONS: None   INDICATIONS: Debra [name] is a 67 y.o. female with R knee pain that has failed non-operative management. Clinical exam and radiographic studies were notable for right knee pain and swelling with instances of giving way with associated popping and catching on the medial aspect of her knee. Additionally, MRI showed a medial meniscus root tear with mild degenerative changes to medial and patellofemoral compartments. Given the poor long-term prognosis of a meniscus root tear and high likelihood of significant progression of osteoarthritis, we elected to proceed with the above procedure after a discussion of the risks, benefits, and alternatives to surgery.    OPERATIVE FINDINGS:    Examination under anesthesia: A careful examination under anesthesia was performed.  Passive range of motion was: Hyperextension: 2.  Extension: 0.  Flexion: 135.  Lachman: normal. Pivot Shift: normal.  Posterior drawer: normal.  Varus stability in full extension: normal.  Varus stability in 30 degrees of flexion: normal.  Valgus stability in full extension: normal.  Valgus stability in 30 degrees of flexion: normal.   Intra-operative findings: A thorough arthroscopic examination of the knee was performed.  The findings are: 1. Suprapatellar  pouch: Normal 2. Undersurface of median ridge: Grade 1-2 degenerative changes 3. Medial patellar facet: Grade 1 degenerative changes 4. Lateral patellar facet: Grade 1-2 degenerative changes 5. Trochlea: Grade 3- degenerative changes of the distal trochlea 6. Lateral gutter/popliteus tendon: Normal 7. Hoffa's fat pad: Inflamed 8. Medial gutter/plica: Normal 9. ACL: Normal 10. PCL: Normal 11. Medial meniscus: Complete tear of the medial meniscus at the posterior root 12. Medial compartment cartilage: Focal area of grade 3-4 degenerative change to the MFC, small, focal grade 3-4 degenerative changes to the central tibial plateau 13. Lateral meniscus: Normal 14. Lateral compartment cartilage: Grade 1 changes to tibial plateau; normal LFC  DESCRIPTION OF PROCEDURE: I identified Collins Lyter Amason in the pre-operative holding area.  I marked the operative knee with my initials. I reviewed the risks and benefits of the proposed surgical intervention and the patient (and/or patient's guardian) wished to proceed. The patient was transferred to the operative suite and placed in the supine position with all bony prominences padded.  Anesthesia was administered. Appropriate IV antibiotics were administered prior to incision. The extremity was then prepped and draped in standard fashion. A time out was performed confirming the correct extremity, correct patient, and correct procedure.   Arthroscopy portals were marked. Local anesthetic was injected to the planned portal sites. The anterolateral portal was established with an 11 blade. The arthroscope was placed in the anterolateral portal and then into the suprapatellar pouch.  A diagnostic knee scope was completed with the above findings. Next the medial portal was established under needle localization. The MCL was pie-crusted to improve visualization of the posterior horn.  The meniscus root tear was identified and probed to confirm our findings. Next, an Arthrex  meniscus root aiming guide was used to mark out the tibial incision. An approximately 5cm vertical incision was made medial to the tibial tubercle. This was carried down to the sartorius fascia with bovie electrocautery, and the fascia was incised. An elevator was used to clear periosteum from the tibia in the area of the anticipated bone tunnel. Hemostasis was achieved. The guide was reinserted into the tibia and was placed over the anatomic footprint of the medial meniscus root. We then used a 6.46mm FlipCutter to drill into the tibia and create a 6mm socket for the meniscus root. A FiberStick was passed through our tibial tunnel and into the joint. This was retrieved and passed through the anterolateral portal.   Next, using a Meniscus Scorpion, an 0-FiberLink suture was passed just medial to the meniscus root in a luggage tag configuration.  A second stitch was passed in a similar fashion medial to the first stitch.  This allowed for excellent purchase of the meniscus root.  We ensured there was no soft tissue bridge between the sutures and pulled the passing stitch from the FiberStick through the anteromedial portal. We then used the passing stitch to bring the sutures in the meniscus out through the tibial tunnel. These were then passed through an [employer] anchor. Anchor hole was drilled ~2cm distal previously drilled tibial tunnel. Anchor was inserted with appropriate tension while visualizing the repair with the arthroscope, and this achieved excellent interference fit. The meniscus root was probed and found to be stable.   Any loose bony debris was removed from the knee joint with a shaver and excess fluid was evacuated from the joint. Closure of the portals with 3-0 Nylon was performed. The sartorius fascia was closed 2-0 vicryl. The subdermal layer of the tibial incision was closed with 2-0 vicryl and the skin was closed with 4-0 Monocryl in a running fashion and Dermabond.  Xeroform gauze and  dry sterile dressings were applied. A PolarCare and hinged knee brace were also applied.   Instrument, sponge, and needle counts were correct prior to wound closure and at the conclusion of the case.   Of note, assistance from a PA was essential to performing the surgery.  PA was present for the entire surgery.  PA assisted with patient positioning, retraction, instrumentation, and wound closure. The surgery would have been more difficult and had longer operative time without PA assistance.    DISPOSITION: PACU - hemodynamically stable.    POSTOPERATIVE PLAN: The patient will be discharged home today.     Non-weight bearing x 4 weeks. 50% WB from weeks 4-6. ASA for DVT ppx x 4 weeks, Narcotic medication, NSAID, and acetaminophen as discussed pre-operatively. The patient will be attending physical therapy beginning 3-4 days post-op. Physical therapy per Meniscus Root Repair Rehab Guidelines.   Patient to return to clinic 10-14 days postop for suture removal.

## 2023-03-05 ENCOUNTER — Encounter: Payer: Self-pay | Admitting: Orthopedic Surgery

## 2023-03-05 NOTE — Anesthesia Postprocedure Evaluation (Signed)
Anesthesia Post Note  Patient: Debra [name]  Procedure(s) Performed: Right medial meniscus root repair, chondroplasty (Right: Knee) Right medial meniscus root repair, chondroplasty (Right: Knee)  Patient location during evaluation: PACU Anesthesia Type: General Level of consciousness: awake and alert Pain management: pain level controlled Vital Signs Assessment: post-procedure vital signs reviewed and stable Respiratory status: spontaneous breathing, nonlabored ventilation, respiratory function stable and patient connected to nasal cannula oxygen Cardiovascular status: blood pressure returned to baseline and stable Postop Assessment: no apparent nausea or vomiting Anesthetic complications: no   No notable events documented.   Last Vitals:  Vitals:   03/01/23 1345 03/01/23 1400  BP: (!) 125/58 126/68  Pulse: 89 91  Resp: (!) 7 12  Temp:    SpO2: 91% 96%    Last Pain:  Vitals:   03/01/23 1400  TempSrc:   PainSc: Asleep                 Tashia Leiterman C Orean Giarratano

## 2023-03-08 ENCOUNTER — Encounter: Payer: Self-pay | Admitting: Orthopedic Surgery

## 2023-03-08 NOTE — Addendum Note (Signed)
Addendum  created 03/08/23 1059 by Barbette Hair, CRNA   Intraprocedure Event edited

## 2023-03-16 ENCOUNTER — Other Ambulatory Visit (HOSPITAL_COMMUNITY): Payer: Self-pay

## 2023-03-16 ENCOUNTER — Telehealth: Payer: Self-pay

## 2023-03-16 NOTE — Telephone Encounter (Signed)
Pharmacy Patient Advocate Encounter   Received notification from [facility] that prior authorization for REPATHA 140MG /ML is required/requested.   PA submitted to [facility] via CoverMyMeds Key: [id number]  Status is pending

## 2023-03-20 NOTE — Telephone Encounter (Signed)
Pharmacy Patient Advocate Encounter  Prior Authorization for REPATHA has been APPROVED by [employer] from 6.8.24 to 6.8.25.

## 2023-04-17 ENCOUNTER — Other Ambulatory Visit: Payer: Self-pay | Admitting: Internal Medicine

## 2023-04-17 NOTE — Telephone Encounter (Signed)
Medication Refill - Medication: omeprazole (PRILOSEC) 40 MG capsule   Has the patient contacted their pharmacy? No. Pharmacy stated no refills  Preferred Pharmacy (with phone number or street name):  [facility] [place], [place] - [address] Phone: [phone]  Fax: [phone]     Has the patient been seen for an appointment in the last year OR does the patient have an upcoming appointment? No.  Agent: Please be advised that RX refills may take up to 3 business days. We ask that you follow-up with your pharmacy.

## 2023-04-18 ENCOUNTER — Other Ambulatory Visit: Payer: Self-pay | Admitting: Internal Medicine

## 2023-04-18 DIAGNOSIS — E1165 Type 2 diabetes mellitus with hyperglycemia: Secondary | ICD-10-CM

## 2023-04-18 MED ORDER — OMEPRAZOLE 40 MG PO CPDR: DELAYED_RELEASE_CAPSULE | ORAL | 0 refills | Status: DC

## 2023-04-18 NOTE — Telephone Encounter (Signed)
Requested Prescriptions  Pending Prescriptions Disp Refills   metFORMIN (GLUCOPHAGE) 1000 MG tablet [Pharmacy Med Name: metFORMIN HCl 1000MG  TABS*] 180 tablet 0    Sig: TAKE 1 TABLET TWICE A DAY WITH A MEAL     Endocrinology:  Diabetes - Biguanides Failed - 04/18/2023  9:19 AM      Failed - B12 Level in normal range and within 720 days    No results found for: "VITAMINB12"       Passed - Cr in normal range and within 360 days    Creat  Date Value Ref Range Status  11/02/2022 0.61 0.50 - 1.05 mg/dL Final   Creatinine, Urine  Date Value Ref Range Status  11/02/2022 52 20 - 275 mg/dL Final         Passed - HBA1C is between 0 and 7.9 and within 180 days    Hgb A1c MFr Bld  Date Value Ref Range Status  11/02/2022 7.4 (H) <5.7 % of total Hgb Final    Comment:    For someone without known diabetes, a hemoglobin A1c value of 6.5% or greater indicates that they may have  diabetes and this should be confirmed with a follow-up  test. . For someone with known diabetes, a value <7% indicates  that their diabetes is well controlled and a value  greater than or equal to 7% indicates suboptimal  control. A1c targets should be individualized based on  duration of diabetes, age, comorbid conditions, and  other considerations. . Currently, no consensus exists regarding use of hemoglobin A1c for diagnosis of diabetes for children. .          Passed - eGFR in normal range and within 360 days    GFR, Est African American  Date Value Ref Range Status  08/16/2017 94 > OR = 60 mL/min/1.47m2 Final   GFR calc Af Amer  Date Value Ref Range Status  05/11/2020 109 >59 mL/min/1.73 Final    Comment:    **[facility] currently reports eGFR in compliance with the current**   recommendations of the [employer]. [facility] will   update reporting as new guidelines are published from the NKF-ASN   Task force.    GFR, Est Non African American  Date Value Ref Range Status  08/16/2017 81 >  OR = 60 mL/min/1.2m2 Final   GFR calc non Af Amer  Date Value Ref Range Status  05/11/2020 94 >59 mL/min/1.73 Final   eGFR  Date Value Ref Range Status  11/02/2022 100 > OR = 60 mL/min/1.84m2 Final  04/26/2022 99 >59 mL/min/1.73 Final         Passed - Valid encounter within last 6 months    Recent Outpatient Visits           5 months ago Essential hypertension   [place] [facility] Margarita Mail, DO   11 months ago Essential hypertension   [place] [facility] Larae Grooms, NP   1 year ago Non-recurrent acute serous otitis media of right ear   [place] [facility] Marjie Skiff, NP   1 year ago Annual physical exam   [place] [facility] Larae Grooms, NP   1 year ago Knuckle pads   [place] [facility] Larae Grooms, NP       Future Appointments             In 2 months Margarita Mail, DO [facility], [facility]  Passed - CBC within normal limits and completed in the last 12 months    WBC  Date Value Ref Range Status  11/02/2022 8.4 3.8 - 10.8 Thousand/uL Final   RBC  Date Value Ref Range Status  11/02/2022 4.68 3.80 - 5.10 Million/uL Final   Hemoglobin  Date Value Ref Range Status  11/02/2022 14.0 11.7 - 15.5 g/dL Final  96/01/5408 81.1 11.1 - 15.9 g/dL Final   HCT  Date Value Ref Range Status  11/02/2022 41.6 35.0 - 45.0 % Final   Hematocrit  Date Value Ref Range Status  08/18/2021 43.1 34.0 - 46.6 % Final   MCHC  Date Value Ref Range Status  11/02/2022 33.7 32.0 - 36.0 g/dL Final   [facility]  Date Value Ref Range Status  11/02/2022 29.9 27.0 - 33.0 pg Final   MCV  Date Value Ref Range Status  11/02/2022 88.9 80.0 - 100.0 fL Final  08/18/2021 89 79 - 97 fL Final   No results found for: "PLTCOUNTKUC", "LABPLAT", "POCPLA" RDW  Date Value Ref Range Status  11/02/2022 12.5 11.0 - 15.0 % Final  08/18/2021 12.7  11.7 - 15.4 % Final

## 2023-04-18 NOTE — Telephone Encounter (Signed)
Requested Prescriptions  Pending Prescriptions Disp Refills   omeprazole (PRILOSEC) 40 MG capsule 90 capsule 0    Sig: TAKE 1 CAPSULE(20 MG) BY MOUTH DAILY     Gastroenterology: Proton Pump Inhibitors Passed - 04/17/2023  1:41 PM      Passed - Valid encounter within last 12 months    Recent Outpatient Visits           5 months ago Essential hypertension   [facility] Margarita Mail, DO   11 months ago Essential hypertension   [place] [facility] Larae Grooms, NP   1 year ago Non-recurrent acute serous otitis media of right ear   [place] [facility] Marjie Skiff, NP   1 year ago Annual physical exam   [place] [facility] Larae Grooms, NP   1 year ago Knuckle pads   [place] [facility] Larae Grooms, NP       Future Appointments             In 2 months Margarita Mail, DO [facility], [facility]

## 2023-04-22 ENCOUNTER — Other Ambulatory Visit: Payer: Self-pay

## 2023-04-22 ENCOUNTER — Other Ambulatory Visit: Payer: Self-pay | Admitting: Internal Medicine

## 2023-04-22 MED ORDER — EZETIMIBE 10 MG PO TABS: 10.0000 mg | ORAL_TABLET | Freq: Every day | ORAL | 0 refills | Status: DC

## 2023-04-22 NOTE — Telephone Encounter (Unsigned)
Copied from CRM [id number]. Topic: General - Other >> Apr 22, 2023  9:44 AM Everette C wrote: Reason for CRM: Medication Refill - Medication: ezetimibe (ZETIA) 10 MG tablet [[id number]]  Has the patient contacted their pharmacy? Yes.   (Agent: If no, request that the patient contact the pharmacy for the refill. If patient does not wish to contact the pharmacy document the reason why and proceed with request.) (Agent: If yes, when and what did the pharmacy advise?)  Preferred Pharmacy (with phone number or street name): [id number] [place], [place] - [address] [place] [address] Phone: [phone] Fax: [phone] Hours: Not open 24 hours   Has the patient been seen for an appointment in the last year OR does the patient have an upcoming appointment? Yes.    Agent: Please be advised that RX refills may take up to 3 business days. We ask that you follow-up with your pharmacy.

## 2023-04-23 NOTE — Telephone Encounter (Signed)
Requested medication (s) are due for refill today:   Signed on 04/22/2023  Requested medication (s) are on the active medication list:   Yes  Future visit scheduled:   Yes in  1 mo.   Last ordered: 04/22/2023 #90, 0 refills  Returned because pharmacy note "authorized quantity exceeded"   Requested Prescriptions  Pending Prescriptions Disp Refills   ezetimibe (ZETIA) 10 MG tablet 90 tablet 0    Sig: Take 1 tablet (10 mg total) by mouth daily.     Cardiovascular:  Antilipid - Sterol Transport Inhibitors Failed - 04/22/2023 10:28 AM      Failed - ALT in normal range and within 360 days    ALT  Date Value Ref Range Status  11/02/2022 34 (H) 6 - 29 U/L Final         Failed - Lipid Panel in normal range within the last 12 months    Cholesterol, Total  Date Value Ref Range Status  04/26/2022 147 100 - 199 mg/dL Final   Cholesterol  Date Value Ref Range Status  05/09/2022 97 0 - 200 mg/dL Final   LDL Cholesterol (Calc)  Date Value Ref Range Status  08/16/2017 171 (H) mg/dL (calc) Final    Comment:    Reference range: <100 . Desirable range <100 mg/dL for primary prevention;   <70 mg/dL for patients with CHD or diabetic patients  with > or = 2 CHD risk factors. [place] Kitchen LDL-C is now calculated using the Martin-Hopkins  calculation, which is a validated novel method providing  better accuracy than the Friedewald equation in the  estimation of LDL-C.  Horald Pollen et al. Lenox Ahr. 1610;960(45): 2061-2068  (http://education.[employer].com/faq/FAQ164)    LDL Chol Calc (NIH)  Date Value Ref Range Status  04/26/2022 24 0 - 99 mg/dL Final   LDL Cholesterol  Date Value Ref Range Status  05/09/2022 16 0 - 99 mg/dL Final    Comment:           Total Cholesterol/HDL:CHD Risk Coronary Heart Disease Risk Table                     Men   Women  1/2 Average Risk   3.4   3.3  Average Risk       5.0   4.4  2 X Average Risk   9.6   7.1  3 X Average Risk  23.4   11.0        Use the  calculated Patient Ratio above and the CHD Risk Table to determine the patient's CHD Risk.        ATP III CLASSIFICATION (LDL):  <100     mg/dL   Optimal  409-811  mg/dL   Near or Above                    Optimal  130-159  mg/dL   Borderline  914-782  mg/dL   High  >956     mg/dL   Very High Performed at [facility], [address] [place], [place] [address]    HDL  Date Value Ref Range Status  05/09/2022 42 >40 mg/dL Final  65/78/4696 41 >29 mg/dL Final   Triglycerides  Date Value Ref Range Status  05/09/2022 197 (H) <150 mg/dL Final         Passed - AST in normal range and within 360 days    AST  Date Value Ref Range Status  11/02/2022 19 10 - 35  U/L Final         Passed - Patient is not pregnant      Passed - Valid encounter within last 12 months    Recent Outpatient Visits           5 months ago Essential hypertension   [place] [facility] Margarita Mail, DO   12 months ago Essential hypertension   [place] [facility] Larae Grooms, NP   1 year ago Non-recurrent acute serous otitis media of right ear   [facility] Marjie Skiff, NP   1 year ago Annual physical exam   [place] [facility] Larae Grooms, NP   1 year ago Knuckle pads   [place] [facility] Larae Grooms, NP       Future Appointments             In 1 month Margarita Mail, DO [facility], [facility]

## 2023-04-29 ENCOUNTER — Encounter: Payer: Self-pay | Admitting: Cardiology

## 2023-05-06 ENCOUNTER — Encounter: Payer: 59 | Admitting: Internal Medicine

## 2023-05-21 ENCOUNTER — Other Ambulatory Visit: Payer: Self-pay | Admitting: *Deleted

## 2023-05-22 MED ORDER — REPATHA 140 MG/ML ~~LOC~~ SOSY: 140.0000 mg | PREFILLED_SYRINGE | SUBCUTANEOUS | 0 refills | Status: DC

## 2023-05-22 NOTE — Telephone Encounter (Signed)
Scheduled 10/04

## 2023-05-29 ENCOUNTER — Other Ambulatory Visit: Payer: Self-pay | Admitting: Internal Medicine

## 2023-05-29 DIAGNOSIS — Z1231 Encounter for screening mammogram for malignant neoplasm of breast: Secondary | ICD-10-CM

## 2023-06-21 ENCOUNTER — Encounter: Payer: 59 | Admitting: Internal Medicine

## 2023-06-27 ENCOUNTER — Ambulatory Visit (INDEPENDENT_AMBULATORY_CARE_PROVIDER_SITE_OTHER): Payer: 59 | Admitting: Nurse Practitioner

## 2023-06-27 ENCOUNTER — Other Ambulatory Visit: Payer: Self-pay

## 2023-06-27 ENCOUNTER — Encounter: Payer: Self-pay | Admitting: Nurse Practitioner

## 2023-06-27 VITALS — BP 118/82 | HR 97 | Temp 99.1°F | Resp 16 | Ht 65.0 in | Wt 173.3 lb

## 2023-06-27 DIAGNOSIS — H66002 Acute suppurative otitis media without spontaneous rupture of ear drum, left ear: Secondary | ICD-10-CM | POA: Diagnosis not present

## 2023-06-27 DIAGNOSIS — B379 Candidiasis, unspecified: Secondary | ICD-10-CM

## 2023-06-27 DIAGNOSIS — T3695XA Adverse effect of unspecified systemic antibiotic, initial encounter: Secondary | ICD-10-CM | POA: Diagnosis not present

## 2023-06-27 DIAGNOSIS — H9202 Otalgia, left ear: Secondary | ICD-10-CM

## 2023-06-27 MED ORDER — FLUCONAZOLE 150 MG PO TABS
150.0000 mg | ORAL_TABLET | ORAL | 0 refills | Status: DC | PRN
Start: 2023-06-27 — End: 2023-10-17

## 2023-06-27 MED ORDER — AMOXICILLIN-POT CLAVULANATE 875-125 MG PO TABS
1.0000 | ORAL_TABLET | Freq: Two times a day (BID) | ORAL | 0 refills | Status: AC
Start: 2023-06-27 — End: 2023-07-04

## 2023-06-27 NOTE — Progress Notes (Signed)
BP 118/82   Pulse 97   Temp 99.1 F (37.3 C) (Oral)   Resp 16   Ht 5\' 5"  (1.651 m)   Wt 173 lb 4.8 oz (78.6 kg)   LMP  (LMP Unknown)   SpO2 98%   BMI 28.84 kg/m    Subjective:    Patient ID: Debra [name], female    DOB: 1956/09/30, 67 y.o.   MRN: [id number]  HPI: Debra [name] is a 67 y.o. female  Chief Complaint  Patient presents with   Ear Pain    Left ear   Left ear pain: patient reports it started on a couple days ago. She also has some nasal congestion and laryngitis.  She reports she gets an ear infection every year.  Upon exam left TM is red and bulging. Will treat with Augmentin.  Patient reports she will sometimes get a yeast infection with antibiotics.  Will send in diflucan.    Relevant past medical, surgical, family and social history reviewed and updated as indicated. Interim medical history since our last visit reviewed. Allergies and medications reviewed and updated.  Review of Systems  Ten systems reviewed and is negative except as mentioned in HPI       Objective:    BP 118/82   Pulse 97   Temp 99.1 F (37.3 C) (Oral)   Resp 16   Ht 5\' 5"  (1.651 m)   Wt 173 lb 4.8 oz (78.6 kg)   LMP  (LMP Unknown)   SpO2 98%   BMI 28.84 kg/m   Wt Readings from Last 3 Encounters:  06/27/23 173 lb 4.8 oz (78.6 kg)  03/01/23 174 lb (78.9 kg)  11/02/22 183 lb 8 oz (83.2 kg)    Physical Exam  Constitutional: Patient appears well-developed and well-nourished.  No distress.  HEENT: head atraumatic, normocephalic, pupils equal and reactive to light, ears Left TM normal, right TM red and bulging, neck supple, throat within normal limits Cardiovascular: Normal rate, regular rhythm and normal heart sounds.  No murmur heard. No BLE edema. Pulmonary/Chest: Effort normal and breath sounds normal. No respiratory distress. Abdominal: Soft.  There is no tenderness. Psychiatric: Patient has a normal mood and affect. behavior is normal. Judgment and thought content  normal.  Results for orders placed or performed during the hospital encounter of 03/01/23  Glucose, capillary  Result Value Ref Range   Glucose-Capillary 101 (H) 70 - 99 mg/dL      Assessment & Plan:   Problem List Items Addressed This Visit   None Visit Diagnoses     Left ear pain    -  Primary   otitis media,,treat with augmentin, can start zyrtec or claritin and flonase   Relevant Medications   amoxicillin-clavulanate (AUGMENTIN) 875-125 MG tablet   Non-recurrent acute suppurative otitis media of left ear without spontaneous rupture of tympanic membrane       otitis media,,treat with augmentin, can start zyrtec or claritin and flonase   Relevant Medications   amoxicillin-clavulanate (AUGMENTIN) 875-125 MG tablet   fluconazole (DIFLUCAN) 150 MG tablet   Antibiotic-induced yeast infection       diflucan sent in   Relevant Medications   fluconazole (DIFLUCAN) 150 MG tablet        Follow up plan: Return if symptoms worsen or fail to improve.

## 2023-07-10 ENCOUNTER — Other Ambulatory Visit: Payer: Self-pay | Admitting: Nurse Practitioner

## 2023-07-10 ENCOUNTER — Other Ambulatory Visit: Payer: Self-pay | Admitting: Internal Medicine

## 2023-07-10 DIAGNOSIS — G8929 Other chronic pain: Secondary | ICD-10-CM | POA: Diagnosis not present

## 2023-07-10 DIAGNOSIS — E1165 Type 2 diabetes mellitus with hyperglycemia: Secondary | ICD-10-CM

## 2023-07-10 DIAGNOSIS — I10 Essential (primary) hypertension: Secondary | ICD-10-CM

## 2023-07-10 DIAGNOSIS — M25561 Pain in right knee: Secondary | ICD-10-CM | POA: Diagnosis not present

## 2023-07-11 NOTE — Telephone Encounter (Signed)
Requested Prescriptions  Pending Prescriptions Disp Refills   hydrochlorothiazide (HYDRODIURIL) 25 MG tablet [Pharmacy Med Name: hydroCHLOROthiazide 25MG  TABS*] 28 tablet 6    Sig: TAKE 1 TABLET BY MOUTH DAILY     Cardiovascular: Diuretics - Thiazide Failed - 07/10/2023  4:33 PM      Failed - Cr in normal range and within 180 days    Creat  Date Value Ref Range Status  11/02/2022 0.61 0.50 - 1.05 mg/dL Final   Creatinine, Urine  Date Value Ref Range Status  11/02/2022 52 20 - 275 mg/dL Final         Failed - K in normal range and within 180 days    Potassium  Date Value Ref Range Status  11/02/2022 4.4 3.5 - 5.3 mmol/L Final         Failed - Na in normal range and within 180 days    Sodium  Date Value Ref Range Status  11/02/2022 137 135 - 146 mmol/L Final  04/26/2022 137 134 - 144 mmol/L Final         Passed - Last BP in normal range    BP Readings from Last 1 Encounters:  06/27/23 118/82         Passed - Valid encounter within last 6 months    Recent Outpatient Visits           2 weeks ago Left ear pain   [facility] Berniece Salines, FNP   8 months ago Essential hypertension   [facility] Margarita Mail, DO   1 year ago Essential hypertension   [place] [facility] Larae Grooms, NP   1 year ago Non-recurrent acute serous otitis media of right ear   [place] [facility] [place], Corrie Dandy T, NP   1 year ago Annual physical exam   [place] [facility] Larae Grooms, NP       Future Appointments             Tomorrow Margarita Mail, DO [place] [facility], [facility]   Tomorrow Debbe Odea, MD [place] [facility]             losartan (COZAAR) 50 MG tablet [Pharmacy Med Name: Losartan Potassium 50MG  TABS] 28 tablet 6    Sig: TAKE 1 TABLET BY MOUTH DAILY     Cardiovascular:  Angiotensin Receptor Blockers  Failed - 07/10/2023  4:33 PM      Failed - Cr in normal range and within 180 days    Creat  Date Value Ref Range Status  11/02/2022 0.61 0.50 - 1.05 mg/dL Final   Creatinine, Urine  Date Value Ref Range Status  11/02/2022 52 20 - 275 mg/dL Final         Failed - K in normal range and within 180 days    Potassium  Date Value Ref Range Status  11/02/2022 4.4 3.5 - 5.3 mmol/L Final         Passed - Patient is not pregnant      Passed - Last BP in normal range    BP Readings from Last 1 Encounters:  06/27/23 118/82         Passed - Valid encounter within last 6 months    Recent Outpatient Visits           2 weeks ago Left ear pain   [facility] Berniece Salines, [place]  8 months ago Essential hypertension   [place] [facility] Margarita Mail, DO   1 year ago Essential hypertension   [place] [facility] Larae Grooms, NP   1 year ago Non-recurrent acute serous otitis media of right ear   [place] [facility] Marjie Skiff, NP   1 year ago Annual physical exam   [place] [facility] Larae Grooms, NP       Future Appointments             Tomorrow Margarita Mail, DO [place] [facility], [facility]   Tomorrow Debbe Odea, MD [facility]

## 2023-07-11 NOTE — Telephone Encounter (Signed)
Requested Prescriptions  Pending Prescriptions Disp Refills   ezetimibe (ZETIA) 10 MG tablet [Pharmacy Med Name: Ezetimibe 10MG  TABS] 28 tablet 0    Sig: TAKE 1 TABLET (10 MG TOTAL) BY MOUTH DAILY.     Cardiovascular:  Antilipid - Sterol Transport Inhibitors Failed - 07/10/2023  4:34 PM      Failed - ALT in normal range and within 360 days    ALT  Date Value Ref Range Status  11/02/2022 34 (H) 6 - 29 U/L Final         Failed - Lipid Panel in normal range within the last 12 months    Cholesterol, Total  Date Value Ref Range Status  04/26/2022 147 100 - 199 mg/dL Final   Cholesterol  Date Value Ref Range Status  05/09/2022 97 0 - 200 mg/dL Final   LDL Cholesterol (Calc)  Date Value Ref Range Status  08/16/2017 171 (H) mg/dL (calc) Final    Comment:    Reference range: <100 . Desirable range <100 mg/dL for primary prevention;   <70 mg/dL for patients with CHD or diabetic patients  with > or = 2 CHD risk factors. Marland Kitchen LDL-C is now calculated using the Martin-Hopkins  calculation, which is a validated novel method providing  better accuracy than the Friedewald equation in the  estimation of LDL-C.  Horald Pollen et al. Lenox Ahr. 3235;573(22): 2061-2068  (http://education.Quest[employer].com/faq/FAQ164)    LDL Chol Calc (NIH)  Date Value Ref Range Status  04/26/2022 24 0 - 99 mg/dL Final   LDL Cholesterol  Date Value Ref Range Status  05/09/2022 16 0 - 99 mg/dL Final    Comment:           Total Cholesterol/HDL:CHD Risk Coronary Heart Disease Risk Table                     Men   Women  1/2 Average Risk   3.4   3.3  Average Risk       5.0   4.4  2 X Average Risk   9.6   7.1  3 X Average Risk  23.4   11.0        Use the calculated Patient Ratio above and the CHD Risk Table to determine the patient's CHD Risk.        ATP III CLASSIFICATION (LDL):  <100     mg/dL   Optimal  025-427  mg/dL   Near or Above                    Optimal  130-159  mg/dL   Borderline  062-376   mg/dL   High  >283     mg/dL   Very High Performed at [facility], [address] [place], [place] [address]    HDL  Date Value Ref Range Status  05/09/2022 42 >40 mg/dL Final  16/04/3709 41 >62 mg/dL Final   Triglycerides  Date Value Ref Range Status  05/09/2022 197 (H) <150 mg/dL Final         Passed - AST in normal range and within 360 days    AST  Date Value Ref Range Status  11/02/2022 19 10 - 35 U/L Final         Passed - Patient is not pregnant      Passed - Valid encounter within last 12 months    Recent Outpatient Visits           2 weeks ago  Left ear pain   [facility] Berniece Salines, FNP   8 months ago Essential hypertension   [facility] Margarita Mail, DO   1 year ago Essential hypertension   [place] [facility] Larae Grooms, NP   1 year ago Non-recurrent acute serous otitis media of right ear   [facility], Corrie Dandy T, NP   1 year ago Annual physical exam   [place] [facility] Larae Grooms, NP       Future Appointments             Tomorrow Margarita Mail, DO [place] [facility]   Tomorrow Debbe Odea, MD  [facility] at [facility]             metFORMIN (GLUCOPHAGE) 1000 MG tablet [Pharmacy Med Name: metFORMIN HCl 1000MG  TABS*] 56 tablet 0    Sig: TAKE 1 TABLET TWICE A DAY WITH MEALS     Endocrinology:  Diabetes - Biguanides Failed - 07/10/2023  4:34 PM      Failed - HBA1C is between 0 and 7.9 and within 180 days    Hgb A1c MFr Bld  Date Value Ref Range Status  11/02/2022 7.4 (H) <5.7 % of total Hgb Final    Comment:    For someone without known diabetes, a hemoglobin A1c value of 6.5% or greater indicates that they may have  diabetes and this should be confirmed with a follow-up  test. . For someone with known diabetes, a value <7% indicates  that  their diabetes is well controlled and a value  greater than or equal to 7% indicates suboptimal  control. A1c targets should be individualized based on  duration of diabetes, age, comorbid conditions, and  other considerations. . Currently, no consensus exists regarding use of hemoglobin A1c for diagnosis of diabetes for children. .          Failed - B12 Level in normal range and within 720 days    No results found for: "VITAMINB12"       Passed - Cr in normal range and within 360 days    Creat  Date Value Ref Range Status  11/02/2022 0.61 0.50 - 1.05 mg/dL Final   Creatinine, Urine  Date Value Ref Range Status  11/02/2022 52 20 - 275 mg/dL Final         Passed - eGFR in normal range and within 360 days    GFR, Est African American  Date Value Ref Range Status  08/16/2017 94 > OR = 60 mL/min/1.95m2 Final   GFR calc Af Amer  Date Value Ref Range Status  05/11/2020 109 >59 mL/min/1.73 Final    Comment:    **[facility] currently reports eGFR in compliance with the current**   recommendations of the [employer]. [facility] will   update reporting as new guidelines are published from the NKF-ASN   Task force.    GFR, Est Non African American  Date Value Ref Range Status  08/16/2017 81 > OR = 60 mL/min/1.19m2 Final   GFR calc non Af Amer  Date Value Ref Range Status  05/11/2020 94 >59 mL/min/1.73 Final   eGFR  Date Value Ref Range Status  11/02/2022 100 > OR = 60 mL/min/1.24m2 Final  04/26/2022 99 >59 mL/min/1.73 Final         Passed - Valid encounter within last 6 months    Recent Outpatient Visits  2 weeks ago Left ear pain   [facility] Berniece Salines, FNP   8 months ago Essential hypertension   [facility] Margarita Mail, DO   1 year ago Essential hypertension   [place] [facility] Larae Grooms, NP   1 year ago Non-recurrent acute serous otitis media of  right ear   [place] [facility] Marjie Skiff, NP   1 year ago Annual physical exam   [place] [facility] Larae Grooms, NP       Future Appointments             Tomorrow Margarita Mail, DO [facility]   Tomorrow Debbe Odea, MD [place] [facility] - CBC within normal limits and completed in the last 12 months    WBC  Date Value Ref Range Status  11/02/2022 8.4 3.8 - 10.8 Thousand/uL Final   RBC  Date Value Ref Range Status  11/02/2022 4.68 3.80 - 5.10 Million/uL Final   Hemoglobin  Date Value Ref Range Status  11/02/2022 14.0 11.7 - 15.5 g/dL Final  16/07/9603 54.0 11.1 - 15.9 g/dL Final   HCT  Date Value Ref Range Status  11/02/2022 41.6 35.0 - 45.0 % Final   Hematocrit  Date Value Ref Range Status  08/18/2021 43.1 34.0 - 46.6 % Final   MCHC  Date Value Ref Range Status  11/02/2022 33.7 32.0 - 36.0 g/dL Final   [facility]  Date Value Ref Range Status  11/02/2022 29.9 27.0 - 33.0 pg Final   MCV  Date Value Ref Range Status  11/02/2022 88.9 80.0 - 100.0 fL Final  08/18/2021 89 79 - 97 fL Final   No results found for: "PLTCOUNTKUC", "LABPLAT", "POCPLA" RDW  Date Value Ref Range Status  11/02/2022 12.5 11.0 - 15.0 % Final  08/18/2021 12.7 11.7 - 15.4 % Final          omeprazole (PRILOSEC) 40 MG capsule [Pharmacy Med Name: Omeprazole 40MG  CPDR] 30 capsule 0    Sig: TAKE 1 CAPSULE(20 MG) BY MOUTH DAILY     Gastroenterology: Proton Pump Inhibitors Passed - 07/10/2023  4:34 PM      Passed - Valid encounter within last 12 months    Recent Outpatient Visits           2 weeks ago Left ear pain   [facility] Berniece Salines, FNP   8 months ago Essential hypertension   [facility] Margarita Mail, DO   1 year ago Essential hypertension   [place] Larae Grooms, NP   1 year ago Non-recurrent acute serous otitis media of right ear   [place] [facility] Marjie Skiff, NP   1 year ago Annual physical exam   [facility] Larae Grooms, NP       Future Appointments             Tomorrow Margarita Mail, DO  [facility]   Tomorrow Debbe Odea, MD [facility]

## 2023-07-12 ENCOUNTER — Encounter: Payer: 59 | Admitting: Internal Medicine

## 2023-07-12 ENCOUNTER — Ambulatory Visit: Payer: Medicare Other | Attending: Cardiology | Admitting: Cardiology

## 2023-07-12 ENCOUNTER — Telehealth: Payer: Self-pay | Admitting: Internal Medicine

## 2023-07-12 ENCOUNTER — Encounter: Payer: Self-pay | Admitting: Internal Medicine

## 2023-07-12 ENCOUNTER — Other Ambulatory Visit: Payer: Self-pay | Admitting: Internal Medicine

## 2023-07-12 ENCOUNTER — Encounter: Payer: Self-pay | Admitting: Cardiology

## 2023-07-12 ENCOUNTER — Other Ambulatory Visit (HOSPITAL_COMMUNITY)
Admission: RE | Admit: 2023-07-12 | Discharge: 2023-07-12 | Disposition: A | Discharge status: Home / Self Care | Payer: Medicare Other | Source: Ambulatory Visit | Attending: Internal Medicine | Admitting: Internal Medicine

## 2023-07-12 ENCOUNTER — Ambulatory Visit (INDEPENDENT_AMBULATORY_CARE_PROVIDER_SITE_OTHER): Payer: Self-pay | Admitting: Internal Medicine

## 2023-07-12 VITALS — BP 116/82 | HR 86 | Temp 97.7°F | Resp 16 | Ht 65.0 in | Wt 173.3 lb

## 2023-07-12 VITALS — BP 136/76 | HR 90 | Ht 65.0 in | Wt 174.0 lb

## 2023-07-12 DIAGNOSIS — Z780 Asymptomatic menopausal state: Secondary | ICD-10-CM | POA: Diagnosis not present

## 2023-07-12 DIAGNOSIS — Z0000 Encounter for general adult medical examination without abnormal findings: Secondary | ICD-10-CM | POA: Diagnosis not present

## 2023-07-12 DIAGNOSIS — E612 Magnesium deficiency: Secondary | ICD-10-CM | POA: Diagnosis not present

## 2023-07-12 DIAGNOSIS — E559 Vitamin D deficiency, unspecified: Secondary | ICD-10-CM | POA: Diagnosis not present

## 2023-07-12 DIAGNOSIS — Z01419 Encounter for gynecological examination (general) (routine) without abnormal findings: Secondary | ICD-10-CM | POA: Diagnosis not present

## 2023-07-12 DIAGNOSIS — Z13820 Encounter for screening for osteoporosis: Secondary | ICD-10-CM

## 2023-07-12 DIAGNOSIS — Z23 Encounter for immunization: Secondary | ICD-10-CM

## 2023-07-12 DIAGNOSIS — E782 Mixed hyperlipidemia: Secondary | ICD-10-CM | POA: Diagnosis not present

## 2023-07-12 DIAGNOSIS — I10 Essential (primary) hypertension: Secondary | ICD-10-CM

## 2023-07-12 DIAGNOSIS — Z1151 Encounter for screening for human papillomavirus (HPV): Secondary | ICD-10-CM | POA: Insufficient documentation

## 2023-07-12 DIAGNOSIS — E119 Type 2 diabetes mellitus without complications: Secondary | ICD-10-CM | POA: Diagnosis not present

## 2023-07-12 DIAGNOSIS — Z1231 Encounter for screening mammogram for malignant neoplasm of breast: Secondary | ICD-10-CM

## 2023-07-12 DIAGNOSIS — Z124 Encounter for screening for malignant neoplasm of cervix: Secondary | ICD-10-CM

## 2023-07-12 NOTE — Progress Notes (Unsigned)
Cardiology Office Note:    Date:  07/12/2023   ID:  Debra [name], DOB 15-Jun-1956, MRN [id number]  PCP:  Margarita Mail, DO  [facility] Cardiologist:  Debbe Odea, MD  [facility] Electrophysiologist:  None   Referring MD: Margarita Mail, DO   Chief Complaint  Patient presents with   Follow-up    Patient denies new or acute cardiac problems/concerns today.      History of Present Illness:    Debra [name] is a 67 y.o. female with a hx of hypertension, hyperlipidemia who presents for follow-up.   Previously seen for hyperlipidemia, tolerating Zetia and Repatha as prescribed.  Obtain fasting lipid profile today with PCP.  Feels well, no concerns at this time.  Prior notes Echocardiogram 06/2020 normal systolic and diastolic function, EF 60 to 65% Coronary CTA 06/2020 normal, no evidence of CAD. She states all of her brothers and mother have history of high cholesterol. Patient did not tolerate statins, Vascepa, fenofibrate, Nexlizet  Past Medical History:  Diagnosis Date   Allergies    Borderline diabetes 03/30/2015   GERD (gastroesophageal reflux disease)    Grade I diastolic dysfunction    Hyperlipidemia    Hypertension    Lumbar back pain    Pre-diabetes    during pregnancy     Past Surgical History:  Procedure Laterality Date   CARPAL TUNNEL RELEASE Right    CHONDROPLASTY Right 03/01/2023   Procedure: Right medial meniscus root repair, chondroplasty;  Surgeon: Signa Kell, MD;  [facility];  Service: Orthopedics;  Laterality: Right;   COLONOSCOPY WITH PROPOFOL N/A 12/30/2020   Procedure: COLONOSCOPY WITH PROPOFOL;  Surgeon: Midge Minium, MD;  Location: [facility];  Service: Endoscopy;  Laterality: N/A;   KNEE ARTHROSCOPY WITH MENISCAL REPAIR Right 03/01/2023   Procedure: Right medial meniscus root repair, chondroplasty;  Surgeon: Signa Kell, MD;  Location: [facility];  Service: Orthopedics;  Laterality:  Right;   LUMBAR LAMINECTOMY/DECOMPRESSION MICRODISCECTOMY Left 10/16/2017   Procedure: Microlumbar decompression L4-5, L5-S1 left;  Surgeon: Jene Every, MD;  Location: [facility];  Service: Orthopedics;  Laterality: Left;  120 mins   PLANTAR FASCIA RELEASE Left    POLYPECTOMY  12/30/2020   Procedure: POLYPECTOMY;  Surgeon: Midge Minium, MD;  [facility];  Service: Endoscopy;;   RHINOPLASTY  1988   SHOULDER SURGERY Right 2011   TEMPOROMANDIBULAR JOINT SURGERY  1990   TOTAL ABDOMINAL HYSTERECTOMY  1998    Current Medications: Current Meds  Medication Sig   acetaminophen (TYLENOL) 500 MG tablet Take 2 tablets (1,000 mg total) by mouth every 8 (eight) hours.   Biotin 1000 MCG CHEW Chew 1 each by mouth daily.   cholecalciferol (VITAMIN D3) 25 MCG (1000 UNIT) tablet Take 1,000 Units by mouth daily.   cyanocobalamin (VITAMIN B12) 1000 MCG tablet Take 1,000 mcg by mouth daily.   EPINEPHrine 0.3 mg/0.3 mL IJ SOAJ injection INJECT 1 PEN IN THIGH MUSCLE ONE TIME FOR ALLERGIC REACTION. MAY REPEAT IN 5 TO 15 MINUTES IF NEEDED   Evolocumab (REPATHA) 140 MG/ML SOSY Inject 140 mg into the skin every 14 (fourteen) days.   ezetimibe (ZETIA) 10 MG tablet TAKE 1 TABLET (10 MG TOTAL) BY MOUTH DAILY.   fexofenadine (ALLEGRA) 180 MG tablet Take 180 mg by mouth daily.   hydrochlorothiazide (HYDRODIURIL) 25 MG tablet TAKE 1 TABLET BY MOUTH DAILY   losartan (COZAAR) 50 MG tablet TAKE 1 TABLET BY MOUTH DAILY   Melatonin 10 MG TABS Take  1 tablet by mouth at bedtime.    metFORMIN (GLUCOPHAGE) 1000 MG tablet TAKE 1 TABLET TWICE A DAY WITH MEALS   omeprazole (PRILOSEC) 40 MG capsule TAKE 1 CAPSULE(20 MG) BY MOUTH DAILY     Allergies:   Fenofibrate, Latex, Nexlizet [bempedoic acid-ezetimibe], Other, Pravastatin sodium, and Shellfish allergy   Social History   Socioeconomic History   Marital status: Married    Spouse name: Not on file   Number of children: Not on file   Years of education: Not  on file   Highest education level: Not on file  Occupational History   Not on file  Tobacco Use   Smoking status: Former    Types: Cigarettes   Smokeless tobacco: Never   Tobacco comments:    33 years quit   Vaping Use   Vaping status: Never Used  Substance and Sexual Activity   Alcohol use: Yes    Comment: Socially   Drug use: No   Sexual activity: Not Currently  Other Topics Concern   Not on file  Social History Narrative   Not on file   Social Determinants of Health   Financial Resource Strain: Low Risk  (07/12/2023)   Overall Financial Resource Strain (CARDIA)    Difficulty of Paying Living Expenses: Not hard at all  Food Insecurity: No Food Insecurity (07/12/2023)   Hunger Vital Sign    Worried About Running Out of Food in the Last Year: Never true    Ran Out of Food in the Last Year: Never true  Transportation Needs: No Transportation Needs (07/12/2023)   PRAPARE - Administrator, Civil Service (Medical): No    Lack of Transportation (Non-Medical): No  Physical Activity: Sufficiently Active (07/12/2023)   Exercise Vital Sign    Days of Exercise per Week: 5 days    Minutes of Exercise per Session: 40 min  Stress: No Stress Concern Present (07/12/2023)   [employer] of Occupational Health - Occupational Stress Questionnaire    Feeling of Stress : Not at all  Social Connections: Socially Integrated (07/12/2023)   Social Connection and Isolation Panel [NHANES]    Frequency of Communication with Friends and Family: Twice a week    Frequency of Social Gatherings with Friends and Family: Twice a week    Attends Religious Services: More than 4 times per year    Active Member of [employer] or Organizations: No    Attends Engineer, structural: More than 4 times per year    Marital Status: Married     Family History: The patient's family history includes Alcohol abuse in her mother; Breast cancer (age of onset: 75) in her mother; Hyperlipidemia in her  brother, brother, and sister; Hypertension in her son; Obesity in her son. She was adopted.  ROS:   Please see the history of present illness.     All other systems reviewed and are negative.  EKGs/Labs/Other Studies Reviewed:    The following studies were reviewed today:  EKG Interpretation Date/Time:  Friday July 12 2023 16:07:38 EDT Ventricular Rate:  90 PR Interval:  150 QRS Duration:  94 QT Interval:  392 QTC Calculation: 479 R Axis:   10  Text Interpretation: Normal sinus rhythm Minimal voltage criteria for LVH, may be normal variant ( Cornell product ) Confirmed by Debbe Odea ([id number]) on 07/12/2023 4:12:12 PM    Recent Labs: 11/02/2022: ALT 34; BUN 16; Creat 0.61; Hemoglobin 14.0; Platelets 380; Potassium 4.4; Sodium 137  Recent Lipid Panel  Component Value Date/Time   CHOL 97 05/09/2022 0654   CHOL 147 04/26/2022 1139   TRIG 197 (H) 05/09/2022 0654   HDL 42 05/09/2022 0654   HDL 41 04/26/2022 1139   CHOLHDL 2.3 05/09/2022 0654   VLDL 39 05/09/2022 0654   LDLCALC 16 05/09/2022 0654   LDLCALC 24 04/26/2022 1139   LDLCALC 171 (H) 08/16/2017 0801    Physical Exam:    VS:  BP 136/76 (BP Location: Left Arm, Patient Position: Sitting, Cuff Size: Normal)   Pulse 90   Ht 5\' 5"  (1.651 m)   Wt 174 lb (78.9 kg)   LMP  (LMP Unknown)   SpO2 96%   BMI 28.96 kg/m     Wt Readings from Last 3 Encounters:  07/12/23 174 lb (78.9 kg)  07/12/23 173 lb 4.8 oz (78.6 kg)  06/27/23 173 lb 4.8 oz (78.6 kg)     GEN:  Well nourished, well developed in no acute distress HEENT: Normal NECK: No JVD; No carotid bruits CARDIAC: RRR, no murmurs, rubs, gallops RESPIRATORY:  Clear to auscultation without rales, wheezing or rhonchi  ABDOMEN: Soft, non-tender, non-distended MUSCULOSKELETAL:  No edema; No deformity  SKIN: Warm and dry NEUROLOGIC:  Alert and oriented x 3 PSYCHIATRIC:  Normal affect   ASSESSMENT:    1. Mixed hyperlipidemia   2. Primary hypertension     PLAN:    In order of problems listed above:  Mixed hyperlipidemia, last triglycerides elevated about a year ago.  Repeat panel obtained today per PCP.  Continue Repatha, Zetia.  Consider adding bempedoic acid if cholesterol not adequately controlled.  Patient is not tolerant to statins, fenofibrate, Vascepa or Nexlizet.  Repeat lipid panel Hypertension, BP controlled, continue losartan, HCTZ.  Follow-up in 1 year or as needed.   Medication Adjustments/Labs and Tests Ordered: Current medicines are reviewed at length with the patient today.  Concerns regarding medicines are outlined above.  Orders Placed This Encounter  Procedures   EKG 12-Lead    No orders of the defined types were placed in this encounter.    There are no Patient Instructions on file for this visit.   Signed, Debbe Odea, MD  07/12/2023 4:26 PM    [facility]

## 2023-07-12 NOTE — Patient Instructions (Signed)
Medication Instructions:  Your physician recommends that you continue on your current medications as directed. Please refer to the Current Medication list given to you today.  *If you need a refill on your cardiac medications before your next appointment, please call your pharmacy*  Lab Work: -None ordered  Testing/Procedures: -None ordered  Follow-Up: At [facility], you and your health needs are our priority.  As part of our continuing mission to provide you with exceptional heart care, we have created designated Provider Care Teams.  These Care Teams include your primary Cardiologist (physician) and Advanced Practice Providers (APPs -  Physician Assistants and Nurse Practitioners) who all work together to provide you with the care you need, when you need it.  Your next appointment:   Follow-up as needed  Provider:   You may see Debbe Odea, MD or one of the following Advanced Practice Providers on your designated Care Team:   Nicolasa Ducking, NP Eula Listen, PA-C Cadence Fransico Michael, PA-C Charlsie Quest, NP    Other Instructions -None

## 2023-07-12 NOTE — Telephone Encounter (Signed)
Requested medication (s) are due for refill today: review  Requested medication (s) are on the active medication list: yes  Last refill:  05/2020  Future visit scheduled: yes  Notes to clinic:  historical med, please review for refill     Requested Prescriptions  Pending Prescriptions Disp Refills   EPINEPHrine 0.3 mg/0.3 mL IJ SOAJ injection 1 each      Immunology: Antidotes Passed - 07/12/2023  5:23 PM      Passed - Valid encounter within last 12 months    Recent Outpatient Visits           Today Annual physical exam   [facility] Margarita Mail, DO   2 weeks ago Left ear pain   [facility] Berniece Salines, FNP   8 months ago Essential hypertension   [facility] Margarita Mail, DO   1 year ago Essential hypertension   [facility] Larae Grooms, NP   1 year ago Non-recurrent acute serous otitis media of right ear   [place] [facility] Marjie Skiff, NP       Future Appointments             In 6 months Margarita Mail, DO [facility]

## 2023-07-12 NOTE — Telephone Encounter (Signed)
Copied from CRM [id number]. Topic: General - Inquiry >> Jul 12, 2023 10:15 AM Payton Doughty wrote: Reason for CRM: pt states she is having her mammogram and bone density same day at [facility] in [place].  However [facility] cannot access the order for her bone density.  they are asking if you can fax the order for the bone density to fax [phone]  [facility] call back:  [phone]

## 2023-07-12 NOTE — Telephone Encounter (Signed)
Medication Refill - Medication: EPINEPHrine 0.3 mg/0.3 mL IJ SOAJ injection [[id number]]   Patient forgot to mention that she needed this refill in office visit today  Has the patient contacted their pharmacy? Yes.   (Agent: If no, request that the patient contact the pharmacy for the refill. If patient does not wish to contact the pharmacy document the reason why and proceed with request.) (Agent: If yes, when and what did the pharmacy advise?)  Preferred Pharmacy (with phone number or street name):  [id number] - [place], [place] - [address] Phone: [phone]  Fax: [phone]     Has the patient been seen for an appointment in the last year OR does the patient have an upcoming appointment? Yes.    Agent: Please be advised that RX refills may take up to 3 business days. We ask that you follow-up with your pharmacy.

## 2023-07-12 NOTE — Telephone Encounter (Signed)
Referral form faxed over

## 2023-07-13 LAB — HEMOGLOBIN A1C
Hgb A1c MFr Bld: 6.2 %{Hb} — ABNORMAL HIGH (ref <5.7)
Mean Plasma Glucose: 131 mg/dL
eAG (mmol/L): 7.3 mmol/L

## 2023-07-13 LAB — LIPID PANEL
Cholesterol: 153 mg/dL (ref <200)
HDL: 56 mg/dL (ref >=50)
LDL Cholesterol (Calc): 57 mg/dL
Non-HDL Cholesterol (Calc): 97 mg/dL (ref <130)
Total CHOL/HDL Ratio: 2.7 (calc) (ref <5.0)
Triglycerides: 382 mg/dL — ABNORMAL HIGH (ref <150)

## 2023-07-13 LAB — VITAMIN D 25 HYDROXY (VIT D DEFICIENCY, FRACTURES): Vit D, 25-Hydroxy: 50 ng/mL (ref 30–100)

## 2023-07-13 LAB — MAGNESIUM: Magnesium: 2 mg/dL (ref 1.5–2.5)

## 2023-07-15 ENCOUNTER — Telehealth: Payer: Self-pay | Admitting: Cardiology

## 2023-07-15 MED ORDER — EPINEPHRINE 0.3 MG/0.3ML IJ SOAJ: 0.3000 mg | INTRAMUSCULAR | 2 refills | Status: AC | PRN

## 2023-07-15 NOTE — Telephone Encounter (Signed)
*  STAT* If patient is at the pharmacy, call can be transferred to refill team.   1. Which medications need to be refilled? (please list name of each medication and dose if known) Evolocumab (REPATHA) 140 MG/ML SOSY  2. Which pharmacy/location (including street and city if local pharmacy) is medication to be sent to? [id number] - [place], [place] - [address] + patient is requesting to have a copy of the prescription sent to [employer].  3. Do they need a 30 day or 90 day supply?   90 day supply + Patient states she will not need the medication until January.

## 2023-07-16 ENCOUNTER — Other Ambulatory Visit: Payer: Self-pay | Admitting: *Deleted

## 2023-07-16 DIAGNOSIS — G8929 Other chronic pain: Secondary | ICD-10-CM | POA: Diagnosis not present

## 2023-07-16 DIAGNOSIS — M25561 Pain in right knee: Secondary | ICD-10-CM | POA: Diagnosis not present

## 2023-07-16 LAB — CYTOLOGY - PAP
Comment: NEGATIVE
Diagnosis: NEGATIVE
High risk HPV: NEGATIVE

## 2023-07-16 MED ORDER — REPATHA 140 MG/ML ~~LOC~~ SOSY: 140.0000 mg | PREFILLED_SYRINGE | SUBCUTANEOUS | 3 refills | Status: DC

## 2023-07-16 NOTE — Telephone Encounter (Signed)
Requested Prescriptions   Signed Prescriptions Disp Refills   Evolocumab (REPATHA) 140 MG/ML SOSY 6 mL 3    Sig: Inject 140 mg into the skin every 14 (fourteen) days.    Authorizing Provider: Debbe Odea    Ordering User: Kendrick Fries

## 2023-07-16 NOTE — Telephone Encounter (Signed)
Disp Refills Start End   Evolocumab (REPATHA) 140 MG/ML SOSY 6 mL 3 07/16/2023 --   Sig - Route: Inject 140 mg into the skin every 14 (fourteen) days. - Subcutaneous   Sent to pharmacy as: Evolocumab (REPATHA) 140 MG/ML Solution Prefilled Syringe   E-Prescribing Status: Receipt confirmed by pharmacy (07/16/2023  7:40 AM EDT)

## 2023-07-19 DIAGNOSIS — M25561 Pain in right knee: Secondary | ICD-10-CM | POA: Diagnosis not present

## 2023-07-19 DIAGNOSIS — G8929 Other chronic pain: Secondary | ICD-10-CM | POA: Diagnosis not present

## 2023-07-22 ENCOUNTER — Ambulatory Visit: Payer: 59

## 2023-07-22 DIAGNOSIS — M25561 Pain in right knee: Secondary | ICD-10-CM | POA: Diagnosis not present

## 2023-07-22 DIAGNOSIS — G8929 Other chronic pain: Secondary | ICD-10-CM | POA: Diagnosis not present

## 2023-07-23 ENCOUNTER — Encounter: Payer: 59 | Admitting: Internal Medicine

## 2023-07-24 DIAGNOSIS — Z13820 Encounter for screening for osteoporosis: Secondary | ICD-10-CM | POA: Diagnosis not present

## 2023-07-24 DIAGNOSIS — Z780 Asymptomatic menopausal state: Secondary | ICD-10-CM | POA: Diagnosis not present

## 2023-07-24 DIAGNOSIS — Z1231 Encounter for screening mammogram for malignant neoplasm of breast: Secondary | ICD-10-CM | POA: Diagnosis not present

## 2023-07-24 LAB — HM DEXA SCAN: HM Dexa Scan: NORMAL

## 2023-07-25 DIAGNOSIS — M25561 Pain in right knee: Secondary | ICD-10-CM | POA: Diagnosis not present

## 2023-07-25 DIAGNOSIS — G8929 Other chronic pain: Secondary | ICD-10-CM | POA: Diagnosis not present

## 2023-07-31 DIAGNOSIS — G8929 Other chronic pain: Secondary | ICD-10-CM | POA: Diagnosis not present

## 2023-07-31 DIAGNOSIS — M25561 Pain in right knee: Secondary | ICD-10-CM | POA: Diagnosis not present

## 2023-08-02 ENCOUNTER — Other Ambulatory Visit: Payer: Self-pay

## 2023-08-02 MED ORDER — REPATHA 140 MG/ML ~~LOC~~ SOSY: 140.0000 mg | PREFILLED_SYRINGE | SUBCUTANEOUS | 3 refills | Status: DC

## 2023-08-05 DIAGNOSIS — M25561 Pain in right knee: Secondary | ICD-10-CM | POA: Diagnosis not present

## 2023-08-05 DIAGNOSIS — G8929 Other chronic pain: Secondary | ICD-10-CM | POA: Diagnosis not present

## 2023-08-07 DIAGNOSIS — E119 Type 2 diabetes mellitus without complications: Secondary | ICD-10-CM | POA: Diagnosis not present

## 2023-08-07 DIAGNOSIS — Z0100 Encounter for examination of eyes and vision without abnormal findings: Secondary | ICD-10-CM | POA: Diagnosis not present

## 2023-08-07 DIAGNOSIS — H2513 Age-related nuclear cataract, bilateral: Secondary | ICD-10-CM | POA: Diagnosis not present

## 2023-08-07 LAB — HM DIABETES EYE EXAM

## 2023-08-14 DIAGNOSIS — G8929 Other chronic pain: Secondary | ICD-10-CM | POA: Diagnosis not present

## 2023-08-14 DIAGNOSIS — M25561 Pain in right knee: Secondary | ICD-10-CM | POA: Diagnosis not present

## 2023-08-16 DIAGNOSIS — G8929 Other chronic pain: Secondary | ICD-10-CM | POA: Diagnosis not present

## 2023-08-16 DIAGNOSIS — M25561 Pain in right knee: Secondary | ICD-10-CM | POA: Diagnosis not present

## 2023-08-21 DIAGNOSIS — S83241A Other tear of medial meniscus, current injury, right knee, initial encounter: Secondary | ICD-10-CM | POA: Diagnosis not present

## 2023-08-21 DIAGNOSIS — G8929 Other chronic pain: Secondary | ICD-10-CM | POA: Diagnosis not present

## 2023-08-21 DIAGNOSIS — M25561 Pain in right knee: Secondary | ICD-10-CM | POA: Diagnosis not present

## 2023-08-23 DIAGNOSIS — M25561 Pain in right knee: Secondary | ICD-10-CM | POA: Diagnosis not present

## 2023-08-23 DIAGNOSIS — G8929 Other chronic pain: Secondary | ICD-10-CM | POA: Diagnosis not present

## 2023-08-28 DIAGNOSIS — G8929 Other chronic pain: Secondary | ICD-10-CM | POA: Diagnosis not present

## 2023-08-28 DIAGNOSIS — M25561 Pain in right knee: Secondary | ICD-10-CM | POA: Diagnosis not present

## 2023-08-30 DIAGNOSIS — G8929 Other chronic pain: Secondary | ICD-10-CM | POA: Diagnosis not present

## 2023-08-30 DIAGNOSIS — M25561 Pain in right knee: Secondary | ICD-10-CM | POA: Diagnosis not present

## 2023-09-02 DIAGNOSIS — G8929 Other chronic pain: Secondary | ICD-10-CM | POA: Diagnosis not present

## 2023-09-02 DIAGNOSIS — M25561 Pain in right knee: Secondary | ICD-10-CM | POA: Diagnosis not present

## 2023-09-03 ENCOUNTER — Other Ambulatory Visit: Payer: Self-pay | Admitting: Internal Medicine

## 2023-09-03 DIAGNOSIS — E1165 Type 2 diabetes mellitus with hyperglycemia: Secondary | ICD-10-CM

## 2023-09-03 NOTE — Telephone Encounter (Signed)
Requested Prescriptions  Pending Prescriptions Disp Refills   ezetimibe (ZETIA) 10 MG tablet [Pharmacy Med Name: Ezetimibe 10MG  TABS] 90 tablet 3    Sig: TAKE 1 TABLET DAILY     Cardiovascular:  Antilipid - Sterol Transport Inhibitors Failed - 09/03/2023  9:39 AM      Failed - ALT in normal range and within 360 days    ALT  Date Value Ref Range Status  11/02/2022 34 (H) 6 - 29 U/L Final         Failed - Lipid Panel in normal range within the last 12 months    Cholesterol, Total  Date Value Ref Range Status  04/26/2022 147 100 - 199 mg/dL Final   Cholesterol  Date Value Ref Range Status  07/12/2023 153 <200 mg/dL Final   LDL Cholesterol (Calc)  Date Value Ref Range Status  07/12/2023 57 mg/dL (calc) Final    Comment:    Reference range: <100 . Desirable range <100 mg/dL for primary prevention;   <70 mg/dL for patients with CHD or diabetic patients  with > or = 2 CHD risk factors. Marland Kitchen LDL-C is now calculated using the Martin-Hopkins  calculation, which is a validated novel method providing  better accuracy than the Friedewald equation in the  estimation of LDL-C.  Horald Pollen et al. Lenox Ahr. 1610;960(45): 2061-2068  (http://education.Quest[employer].com/faq/FAQ164)    HDL  Date Value Ref Range Status  07/12/2023 56 > OR = 50 mg/dL Final  40/98/1191 41 >47 mg/dL Final   Triglycerides  Date Value Ref Range Status  07/12/2023 382 (H) <150 mg/dL Final    Comment:    . If a non-fasting specimen was collected, consider repeat triglyceride testing on a fasting specimen if clinically indicated.  Perry Mount et al. J. of Clin. Lipidol. 2015;9:129-169. Marland Kitchen          Passed - AST in normal range and within 360 days    AST  Date Value Ref Range Status  11/02/2022 19 10 - 35 U/L Final         Passed - Patient is not pregnant      Passed - Valid encounter within last 12 months    Recent Outpatient Visits           1 month ago Annual physical exam   [facility] Margarita Mail, DO   2 months ago Left ear pain   [facility] Berniece Salines, FNP   10 months ago Essential hypertension   [facility] Margarita Mail, DO   1 year ago Essential hypertension   [facility] Larae Grooms, NP   1 year ago Non-recurrent acute serous otitis media of right ear   [facility] Marjie Skiff, NP       Future Appointments             In 4 months Margarita Mail, DO [place] [facility]             metFORMIN (GLUCOPHAGE) 1000 MG tablet [Pharmacy Med Name: metFORMIN HCl 1000MG  TABS*] 180 tablet 1    Sig: TAKE 1 TABLET TWICE A DAY WITH MEALS     Endocrinology:  Diabetes - Biguanides Failed - 09/03/2023  9:39 AM      Failed - B12 Level in normal range and within 720 days    No results found for: "VITAMINB12"       Passed - Cr  in normal range and within 360 days    Creat  Date Value Ref Range Status  11/02/2022 0.61 0.50 - 1.05 mg/dL Final   Creatinine, Urine  Date Value Ref Range Status  11/02/2022 52 20 - 275 mg/dL Final         Passed - HBA1C is between 0 and 7.9 and within 180 days    Hgb A1c MFr Bld  Date Value Ref Range Status  07/12/2023 6.2 (H) <5.7 % of total Hgb Final    Comment:    For someone without known diabetes, a hemoglobin  A1c value between 5.7% and 6.4% is consistent with prediabetes and should be confirmed with a  follow-up test. . For someone with known diabetes, a value <7% indicates that their diabetes is well controlled. A1c targets should be individualized based on duration of diabetes, age, comorbid conditions, and other considerations. . This assay result is consistent with an increased risk of diabetes. . Currently, no consensus exists regarding use of hemoglobin A1c for diagnosis of diabetes for children. .          Passed - eGFR in normal range  and within 360 days    GFR, Est African American  Date Value Ref Range Status  08/16/2017 94 > OR = 60 mL/min/1.71m2 Final   GFR calc Af Amer  Date Value Ref Range Status  05/11/2020 109 >59 mL/min/1.73 Final    Comment:    **[facility] currently reports eGFR in compliance with the current**   recommendations of the [employer]. [facility] will   update reporting as new guidelines are published from the NKF-ASN   Task force.    GFR, Est Non African American  Date Value Ref Range Status  08/16/2017 81 > OR = 60 mL/min/1.42m2 Final   GFR calc non Af Amer  Date Value Ref Range Status  05/11/2020 94 >59 mL/min/1.73 Final   eGFR  Date Value Ref Range Status  11/02/2022 100 > OR = 60 mL/min/1.15m2 Final  04/26/2022 99 >59 mL/min/1.73 Final         Passed - Valid encounter within last 6 months    Recent Outpatient Visits           1 month ago Annual physical exam   [facility] Margarita Mail, DO   2 months ago Left ear pain   [facility] Berniece Salines, FNP   10 months ago Essential hypertension   [facility] Margarita Mail, DO   1 year ago Essential hypertension   [facility] Larae Grooms, NP   1 year ago Non-recurrent acute serous otitis media of right ear   [facility] Aura Dials T, NP       Future Appointments             In 4 months Margarita Mail, DO  [facility]            Passed - CBC within normal limits and completed in the last 12 months    WBC  Date Value Ref Range Status  11/02/2022 8.4 3.8 - 10.8 Thousand/uL Final   RBC  Date Value Ref Range Status  11/02/2022 4.68 3.80 - 5.10 Million/uL Final   Hemoglobin  Date Value Ref Range Status  11/02/2022 14.0 11.7 - 15.5 g/dL Final  16/07/9603 54.0 11.1 - 15.9 g/dL Final   HCT  Date Value Ref Range Status  11/02/2022 41.6 35.0 - 45.0 % Final   Hematocrit  Date Value Ref Range Status  08/18/2021 43.1 34.0 - 46.6 % Final   MCHC  Date Value Ref Range Status  11/02/2022 33.7 32.0 - 36.0 g/dL Final   [facility]  Date Value Ref Range Status  11/02/2022 29.9 27.0 - 33.0 pg Final   MCV  Date Value Ref Range Status  11/02/2022 88.9 80.0 - 100.0 fL Final  08/18/2021 89 79 - 97 fL Final   No results found for: "PLTCOUNTKUC", "LABPLAT", "POCPLA" RDW  Date Value Ref Range Status  11/02/2022 12.5 11.0 - 15.0 % Final  08/18/2021 12.7 11.7 - 15.4 % Final

## 2023-09-08 DIAGNOSIS — E119 Type 2 diabetes mellitus without complications: Secondary | ICD-10-CM | POA: Diagnosis not present

## 2023-09-09 DIAGNOSIS — G8929 Other chronic pain: Secondary | ICD-10-CM | POA: Diagnosis not present

## 2023-09-09 DIAGNOSIS — M25561 Pain in right knee: Secondary | ICD-10-CM | POA: Diagnosis not present

## 2023-09-18 DIAGNOSIS — M25561 Pain in right knee: Secondary | ICD-10-CM | POA: Diagnosis not present

## 2023-09-18 DIAGNOSIS — G8929 Other chronic pain: Secondary | ICD-10-CM | POA: Diagnosis not present

## 2023-09-25 DIAGNOSIS — G8929 Other chronic pain: Secondary | ICD-10-CM | POA: Diagnosis not present

## 2023-09-25 DIAGNOSIS — M25561 Pain in right knee: Secondary | ICD-10-CM | POA: Diagnosis not present

## 2023-09-27 DIAGNOSIS — G8929 Other chronic pain: Secondary | ICD-10-CM | POA: Diagnosis not present

## 2023-09-27 DIAGNOSIS — M25561 Pain in right knee: Secondary | ICD-10-CM | POA: Diagnosis not present

## 2023-10-07 DIAGNOSIS — G8929 Other chronic pain: Secondary | ICD-10-CM | POA: Diagnosis not present

## 2023-10-07 DIAGNOSIS — M25561 Pain in right knee: Secondary | ICD-10-CM | POA: Diagnosis not present

## 2023-10-10 ENCOUNTER — Other Ambulatory Visit: Payer: Self-pay | Admitting: Nurse Practitioner

## 2023-10-10 DIAGNOSIS — I10 Essential (primary) hypertension: Secondary | ICD-10-CM

## 2023-10-14 ENCOUNTER — Telehealth: Payer: Self-pay | Admitting: Pharmacy Technician

## 2023-10-14 ENCOUNTER — Other Ambulatory Visit: Payer: Self-pay | Admitting: Internal Medicine

## 2023-10-14 ENCOUNTER — Telehealth: Payer: Self-pay | Admitting: Cardiology

## 2023-10-14 ENCOUNTER — Other Ambulatory Visit (HOSPITAL_COMMUNITY): Payer: Self-pay

## 2023-10-14 DIAGNOSIS — I10 Essential (primary) hypertension: Secondary | ICD-10-CM

## 2023-10-14 NOTE — Telephone Encounter (Signed)
 Pharmacy Patient Advocate Encounter   Received notification from Pt Calls Messages that prior authorization for repatha  is required/requested.   Insurance verification completed.   The patient is insured through [facility] .   Per test claim: The current 10/14/23 day co-pay is, $420.00 one month.  No PA needed at this time. This test claim was processed through [facility]- copay amounts may vary at other pharmacies due to pharmacy/plan contracts, or as the patient moves through the different stages of their insurance plan.

## 2023-10-14 NOTE — Telephone Encounter (Signed)
 Jasmine with [facility] calling   Medication Refill -  Most Recent Primary Care Visit:  Provider: BERNARDO FEND  Department: [facility]  Visit Type: PHYSICAL  Date: 07/12/2023  Medication: losartan  (COZAAR ) 50 MG tablet [14824]   and  hydrochlorothiazide  (HYDRODIURIL ) 25 MG tablet [address] [place], [place] - [address] [place] [address] Phone: [phone] Fax: [phone]

## 2023-10-14 NOTE — Telephone Encounter (Signed)
 Requested medication (s) are due for refill today: yes  Requested medication (s) are on the active medication list: ues  Last refill:  both reordered 07/11/23 #90 each  Future visit scheduled: yes in April  Notes to clinic:  overdue lab work-   Requested Prescriptions  Pending Prescriptions Disp Refills   hydrochlorothiazide  (HYDRODIURIL ) 25 MG tablet [Pharmacy Med Name: hydroCHLOROthiazide  25MG  TABS*] 28 tablet     Sig: TAKE 1 TABLET DAILY     Cardiovascular: Diuretics - Thiazide Failed - 10/14/2023  2:52 PM      Failed - Cr in normal range and within 180 days    Creat  Date Value Ref Range Status  11/02/2022 0.61 0.50 - 1.05 mg/dL Final   Creatinine, Urine  Date Value Ref Range Status  11/02/2022 52 20 - 275 mg/dL Final         Failed - K in normal range and within 180 days    Potassium  Date Value Ref Range Status  11/02/2022 4.4 3.5 - 5.3 mmol/L Final         Failed - Na in normal range and within 180 days    Sodium  Date Value Ref Range Status  11/02/2022 137 135 - 146 mmol/L Final  04/26/2022 137 134 - 144 mmol/L Final         Passed - Last BP in normal range    BP Readings from Last 1 Encounters:  07/12/23 136/76         Passed - Valid encounter within last 6 months    Recent Outpatient Visits           3 months ago Annual physical exam   [facility] Bernardo Fend, DO   3 months ago Left ear pain   [facility] Gareth Mliss FALCON, FNP   11 months ago Essential hypertension   [facility] Bernardo Fend, DO   1 year ago Essential hypertension   [facility]vin Pao, NP   1 year ago Non-recurrent acute serous otitis media of right ear   [facility] Valerio Moris T, NP       Future Appointments             In 3 months Bernardo Fend, DO  [facility]              losartan  (COZAAR ) 50 MG tablet [Pharmacy Med Name: Losartan  Potassium 50MG  TABS] 28 tablet     Sig: TAKE 1 TABLET DAILY     Cardiovascular:  Angiotensin Receptor Blockers Failed - 10/14/2023  2:52 PM      Failed - Cr in normal range and within 180 days    Creat  Date Value Ref Range Status  11/02/2022 0.61 0.50 - 1.05 mg/dL Final   Creatinine, Urine  Date Value Ref Range Status  11/02/2022 52 20 - 275 mg/dL Final         Failed - K in normal range and within 180 days    Potassium  Date Value Ref Range Status  11/02/2022 4.4 3.5 - 5.3 mmol/L Final         Passed - Patient is not pregnant      Passed - Last BP in normal range    BP Readings from Last 1 Encounters:  07/12/23 136/76         Passed - Valid encounter within last 6 months  Recent Outpatient Visits           3 months ago Annual physical exam   [facility] Fend, DO   3 months ago Left ear pain   [facility] Gareth Mliss FALCON, FNP   11 months ago Essential hypertension   [facility] Bernardo Fend, DO   1 year ago Essential hypertension   [facility] Melvin Pao, NP   1 year ago Non-recurrent acute serous otitis media of right ear   [facility] Valerio Melanie DASEN, NP       Future Appointments             In 3 months Bernardo Fend, DO [facility]

## 2023-10-14 NOTE — Telephone Encounter (Signed)
 Spoke with patient and reviewed pharmacy note regarding cost. She verbalized understanding and will contact her insurance.

## 2023-10-14 NOTE — Telephone Encounter (Signed)
 Pt c/o medication issue:  1. Name of Medication:  Evolocumab  (REPATHA ) 140 MG/ML SOSY  2. How are you currently taking this medication (dosage and times per day)?   3. Are you having a reaction (difficulty breathing--STAT)?   4. What is your medication issue?   Patient states the insurance needs a PA for Repatha .

## 2023-10-15 ENCOUNTER — Telehealth: Payer: Self-pay | Admitting: Cardiology

## 2023-10-15 ENCOUNTER — Other Ambulatory Visit (HOSPITAL_COMMUNITY): Payer: Self-pay

## 2023-10-15 MED ORDER — REPATHA 140 MG/ML ~~LOC~~ SOSY
140.0000 mg | PREFILLED_SYRINGE | SUBCUTANEOUS | 3 refills | Status: DC
  Filled 2023-10-15 – 2023-10-16 (×2): qty 2, 28d supply, fill #0
  Filled 2023-10-23: qty 6, 84d supply, fill #0

## 2023-10-15 NOTE — Telephone Encounter (Signed)
 Left voicemail message to call back

## 2023-10-15 NOTE — Telephone Encounter (Signed)
 Duplicate see other encounter

## 2023-10-15 NOTE — Telephone Encounter (Signed)
 Pt was returning nurse Pam's call after speaking with her insurance. Please advise (refer to 10/14/23 phone encounter)

## 2023-10-15 NOTE — Telephone Encounter (Signed)
 The patient was returning the call to [facility]. She stated that her Repatha will be $45 if sent to [facility]. This has been completed for her, per her request.

## 2023-10-15 NOTE — Telephone Encounter (Signed)
 Pt returning call to a nurse

## 2023-10-16 ENCOUNTER — Other Ambulatory Visit (HOSPITAL_COMMUNITY): Payer: Self-pay

## 2023-10-17 ENCOUNTER — Other Ambulatory Visit: Payer: Self-pay

## 2023-10-17 ENCOUNTER — Ambulatory Visit (INDEPENDENT_AMBULATORY_CARE_PROVIDER_SITE_OTHER): Payer: Medicare Other | Admitting: Internal Medicine

## 2023-10-17 VITALS — BP 120/80 | HR 98 | Temp 98.3°F | Resp 18 | Ht 65.0 in | Wt 176.9 lb

## 2023-10-17 DIAGNOSIS — J0100 Acute maxillary sinusitis, unspecified: Secondary | ICD-10-CM | POA: Diagnosis not present

## 2023-10-17 DIAGNOSIS — R051 Acute cough: Secondary | ICD-10-CM

## 2023-10-17 MED ORDER — AMOXICILLIN-POT CLAVULANATE 875-125 MG PO TABS: 1.0000 | ORAL_TABLET | Freq: Two times a day (BID) | ORAL | 0 refills | Status: AC

## 2023-10-17 MED ORDER — BENZONATATE 100 MG PO CAPS: 100.0000 mg | ORAL_CAPSULE | Freq: Two times a day (BID) | ORAL | 0 refills | Status: DC | PRN

## 2023-10-17 NOTE — Progress Notes (Signed)
 Acute Office Visit  Subjective:     Patient ID: Debra [name], female    DOB: 08/29/1956, 68 y.o.   MRN: [id number]  Chief Complaint  Patient presents with   Sinusitis    Head congestion, cough, facial pressure for 1 wek. @ covid test negative    Sinusitis Associated symptoms include congestion, coughing, ear pain, headaches and shortness of breath. Pertinent negatives include no chills or sore throat.   Patient is in today for congestion, sinus pressure. 2 COVID tests negative at home.   URI Compliant:  -Fever: no; subjective  -Cough: yes, productive yellow mucus -Shortness of breath: yes with exertion  -Wheezing: no -Chest pain: yes, with cough -Chest tightness: yes -Chest congestion: yes -Nasal congestion: yes -Runny nose: yes, clear but sometime yellow -Post nasal drip: yes -Sore throat: no -Sinus pressure: yes -Headache: yes -Face pain: yes -Ear pain: yes right -Ear pressure: yes right -Sick contacts: no  -Context: worse -Treatments attempted: none - was supposed to have allergy testing tomorrow so she couldn't take anything     Review of Systems  Constitutional:  Negative for chills and fever.  HENT:  Positive for congestion and ear pain. Negative for sore throat.   Respiratory:  Positive for cough, sputum production and shortness of breath.   Cardiovascular:  Negative for chest pain.  Neurological:  Positive for headaches.        Objective:    BP 120/80 (Cuff Size: Large)   Pulse 98   Temp 98.3 F (36.8 C) (Other (Comment))   Resp 18   Ht 5' 5 (1.651 m)   Wt 176 lb 14.4 oz (80.2 kg)   LMP  (LMP Unknown)   SpO2 93%   BMI 29.44 kg/m  BP Readings from Last 3 Encounters:  10/17/23 120/80  07/12/23 136/76  07/12/23 116/82   Wt Readings from Last 3 Encounters:  10/17/23 176 lb 14.4 oz (80.2 kg)  07/12/23 174 lb (78.9 kg)  07/12/23 173 lb 4.8 oz (78.6 kg)      Physical Exam Constitutional:      Appearance: Normal appearance.  HENT:      Head: Normocephalic and atraumatic.     Comments: Ptp over right maxillary sinus     Right Ear: Tympanic membrane and external ear normal.     Left Ear: Tympanic membrane, ear canal and external ear normal.     Nose: Congestion present.     Mouth/Throat:     Mouth: Mucous membranes are moist.     Pharynx: Posterior oropharyngeal erythema present.  Eyes:     Conjunctiva/sclera: Conjunctivae normal.  Neck:     Comments: Large reactive lymph node on the left anterior cervical chain Cardiovascular:     Rate and Rhythm: Normal rate and regular rhythm.  Pulmonary:     Effort: Pulmonary effort is normal.     Breath sounds: Normal breath sounds.  Musculoskeletal:     Cervical back: No tenderness.  Lymphadenopathy:     Cervical: Cervical adenopathy present.  Skin:    General: Skin is warm and dry.  Neurological:     General: No focal deficit present.     Mental Status: She is alert. Mental status is at baseline.  Psychiatric:        Mood and Affect: Mood normal.        Behavior: Behavior normal.     No results found for any visits on 10/17/23.      Assessment & Plan:  1. Acute non-recurrent maxillary sinusitis (Primary)/Acute cough: Sinus pain over right maxillary sinus, will treat with Augmentin  and decongestant. Will prescribe cough suppressant as well. Follow up if symptoms worsen or fail to improve.   - benzonatate  (TESSALON ) 100 MG capsule; Take 1 capsule (100 mg total) by mouth 2 (two) times daily as needed for cough.  Dispense: 20 capsule; Refill: 0 - amoxicillin -clavulanate (AUGMENTIN ) 875-125 MG tablet; Take 1 tablet by mouth 2 (two) times daily for 5 days.  Dispense: 10 tablet; Refill: 0   Return if symptoms worsen or fail to improve.  Sharyle Fischer, DO

## 2023-10-17 NOTE — Telephone Encounter (Signed)
 Requested Prescriptions  Refused Prescriptions Disp Refills   losartan  (COZAAR ) 50 MG tablet 90 tablet 0    Sig: Take 1 tablet (50 mg total) by mouth daily.     Cardiovascular:  Angiotensin Receptor Blockers Failed - 10/17/2023  8:37 AM      Failed - Cr in normal range and within 180 days    Creat  Date Value Ref Range Status  11/02/2022 0.61 0.50 - 1.05 mg/dL Final   Creatinine, Urine  Date Value Ref Range Status  11/02/2022 52 20 - 275 mg/dL Final         Failed - K in normal range and within 180 days    Potassium  Date Value Ref Range Status  11/02/2022 4.4 3.5 - 5.3 mmol/L Final         Passed - Patient is not pregnant      Passed - Last BP in normal range    BP Readings from Last 1 Encounters:  07/12/23 136/76         Passed - Valid encounter within last 6 months    Recent Outpatient Visits           3 months ago Annual physical exam   [facility] Bernardo Fend, DO   3 months ago Left ear pain   [facility] Gareth Mliss FALCON, FNP   11 months ago Essential hypertension   [facility] Bernardo Fend, DO   1 year ago Essential hypertension   [place] [facility] Melvin Pao, NP   1 year ago Non-recurrent acute serous otitis media of right ear   [facility] [place], Melanie T, NP       Future Appointments             In 2 months Bernardo Fend, DO  [facility]             hydrochlorothiazide  (HYDRODIURIL ) 25 MG tablet 90 tablet 0    Sig: Take 1 tablet (25 mg total) by mouth daily.     Cardiovascular: Diuretics - Thiazide Failed - 10/17/2023  8:37 AM      Failed - Cr in normal range and within 180 days    Creat  Date Value Ref Range Status  11/02/2022 0.61 0.50 - 1.05 mg/dL Final   Creatinine, Urine  Date Value Ref Range Status  11/02/2022 52 20 - 275 mg/dL Final         Failed - K in  normal range and within 180 days    Potassium  Date Value Ref Range Status  11/02/2022 4.4 3.5 - 5.3 mmol/L Final         Failed - Na in normal range and within 180 days    Sodium  Date Value Ref Range Status  11/02/2022 137 135 - 146 mmol/L Final  04/26/2022 137 134 - 144 mmol/L Final         Passed - Last BP in normal range    BP Readings from Last 1 Encounters:  07/12/23 136/76         Passed - Valid encounter within last 6 months    Recent Outpatient Visits           3 months ago Annual physical exam   [facility] Bern[facility] Fend, DO   3 months ago Left ear pain   [facility] Gareth Mliss FALCON, [place]  11 months ago Essential hypertension   [facility] Bernardo Fend, DO   1 year ago Essential hypertension   [place] [facility] Melvin Pao, NP   1 year ago Non-recurrent acute serous otitis media of right ear   [facility] Valerio Melanie DASEN, NP       Future Appointments             In 2 months Bernardo Fend, DO [facility]

## 2023-10-22 ENCOUNTER — Other Ambulatory Visit: Payer: Self-pay

## 2023-10-23 ENCOUNTER — Other Ambulatory Visit: Payer: Self-pay

## 2023-10-23 ENCOUNTER — Other Ambulatory Visit (HOSPITAL_COMMUNITY): Payer: Self-pay

## 2023-10-23 ENCOUNTER — Telehealth: Payer: Self-pay | Admitting: Cardiology

## 2023-10-23 NOTE — Telephone Encounter (Signed)
 T would like a c/b from nurse Pam in regards to Prior Auth for Repatha . Please advise

## 2023-10-25 ENCOUNTER — Telehealth: Payer: Self-pay | Admitting: Pharmacy Technician

## 2023-10-25 ENCOUNTER — Other Ambulatory Visit (HOSPITAL_COMMUNITY): Payer: Self-pay

## 2023-10-25 NOTE — Telephone Encounter (Signed)
Spoke with patient and updated her that they submitted prior authorization and she verbalized understanding with no further questions. Instructed her to give [place] a call back if she should need any further assistance with this.      Recardo Evangelist D, CPhT  Sent: Caleen Essex October 25, 2023 10:24 AM   Message  PA request has been Submitted. New Encounter created for follow up. For additional info see Pharmacy Prior Auth telephone encounter from 10/25/23.

## 2023-10-25 NOTE — Telephone Encounter (Signed)
Spoke with patient and discussed her repatha. She states that [employer] called her needing a prior authorization. Advised that I would get this sent over to our prior auth team. Instructed her to call back if she should have any further needs.

## 2023-10-25 NOTE — Telephone Encounter (Signed)
Pharmacy Patient Advocate Encounter   Received notification from Pt Calls Messages that prior authorization for repatha is required/requested.   Insurance verification completed.   The patient is insured through  [employer]  .   Per test claim: The current 10/28/23 day co-pay is, $420.00 one month  .  No PA needed at this time. This test claim was processed through [facility]- copay amounts may vary at other pharmacies due to pharmacy/plan contracts, or as the patient moves through the different stages of their insurance plan.

## 2023-10-26 ENCOUNTER — Other Ambulatory Visit (HOSPITAL_COMMUNITY): Payer: Self-pay

## 2023-10-28 ENCOUNTER — Other Ambulatory Visit: Payer: Self-pay

## 2023-10-28 ENCOUNTER — Ambulatory Visit: Payer: Self-pay

## 2023-10-28 ENCOUNTER — Other Ambulatory Visit (HOSPITAL_COMMUNITY): Payer: Self-pay

## 2023-10-28 MED ORDER — REPATHA SURECLICK 140 MG/ML ~~LOC~~ SOAJ
140.0000 mg | SUBCUTANEOUS | 6 refills | Status: DC
Start: 2023-10-28 — End: 2023-10-28
  Filled 2023-10-28: qty 2, 28d supply, fill #0

## 2023-10-28 MED ORDER — REPATHA SURECLICK 140 MG/ML ~~LOC~~ SOAJ
140.0000 mg | SUBCUTANEOUS | 6 refills | Status: DC
  Filled 2023-10-28: qty 2, 28d supply, fill #0
  Filled 2023-11-22: qty 2, 28d supply, fill #1

## 2023-10-28 NOTE — Addendum Note (Signed)
Addended by: Bryna Colander on: 10/28/2023 01:31 PM   Modules accepted: Orders

## 2023-10-28 NOTE — Telephone Encounter (Signed)
2nd attempt, Patient called, left VM to return the call to the office to speak to the NT.  Will go ahead and route to practice since for medication request.

## 2023-10-28 NOTE — Telephone Encounter (Signed)
Patient called, left VM to return the call to the office to speak to the NT.   Summary: Medication for yeast infection   Patient states that she just finished taking antibiotics and is now having symptoms of a yeast infection(itching) for 3 day.

## 2023-10-28 NOTE — Telephone Encounter (Signed)
Patient returned my call. Reviewed that pharmacy was able to get her a discount. She verbalized understanding with no further questions.

## 2023-10-28 NOTE — Telephone Encounter (Signed)
Left voicemail message that medication copay will be $0 and to call back if any further questions.

## 2023-10-28 NOTE — Telephone Encounter (Signed)
Spoke with patient and reviewed information on meeting her deductible. Encouraged her to reach out to her insurance company to inquire about the deductible and potential payment plan. Sent her prescription to local [facility] pharmacy. She was very appreciative for the information and call and had no further questions at this time.

## 2023-10-29 ENCOUNTER — Other Ambulatory Visit: Payer: Self-pay

## 2023-10-29 ENCOUNTER — Other Ambulatory Visit: Payer: Self-pay | Admitting: Nurse Practitioner

## 2023-10-29 ENCOUNTER — Other Ambulatory Visit (HOSPITAL_COMMUNITY): Payer: Self-pay

## 2023-10-29 ENCOUNTER — Other Ambulatory Visit: Payer: Self-pay | Admitting: Internal Medicine

## 2023-10-29 DIAGNOSIS — B379 Candidiasis, unspecified: Secondary | ICD-10-CM

## 2023-10-29 MED ORDER — FLUCONAZOLE 150 MG PO TABS: 150.0000 mg | ORAL_TABLET | Freq: Once | ORAL | 0 refills | Status: AC

## 2023-10-29 NOTE — Telephone Encounter (Signed)
Debra [name] with [employer] called in stating pt put in an expedited appeal on this past Saturday due to denial of PA and she stated she needs to know if pt has any of the following below by 9am today. She states if its not in by 9am then we would not be able to file another PA for 60 days. She states she also sent over an urgent fax over the weekend for this info. Please advise. She states you can her back with this info as well.  Acute coronary syndrome History of myocardial infarction  Stable or unable angina Coronary or other arterial revascularization  Stroke Transient ischemic attack  Peripheral artery disease and aortic aneurysm

## 2023-10-29 NOTE — Telephone Encounter (Signed)
Requested medication (s) are due for refill today - no  Requested medication (s) are on the active medication list -no  Future visit scheduled -yes  Last refill: 06/27/23  Notes to clinic: off protocol- provider review    Requested Prescriptions  Pending Prescriptions Disp Refills   fluconazole (DIFLUCAN) 150 MG tablet [Pharmacy Med Name: Fluconazole 150MG  TABS] 2 tablet 0    Sig: TAKE 1 TABLET BY MOUTH EVERY 3 DAYS AS NEEDED FOR VAGINAL ITCHING YEAST INFECTION     Off-Protocol Failed - 10/29/2023  2:43 PM      Failed - Medication not assigned to a protocol, review manually.      Passed - Valid encounter within last 12 months    Recent Outpatient Visits           1 week ago Acute non-recurrent maxillary sinusitis   [facility]arita Mail, DO   3 months ago Annual physical exam   [facility]arita Mail, DO   4 months ago Left ear pain   [facility] Berniece Salines, FNP   12 months ago Essential hypertension   [facility]ar[facility] Mail, DO   1 year ago Essential hypertension   [facility] Larae Grooms, NP       Future Appointments             In 2 months Margarita Mail, DO [place] [facility]               Requested Prescriptions  Pending Prescriptions Disp Refills   fluconazole (DIFLUCAN) 150 MG tablet [Pharmacy Med Name: Fluconazole 150MG  TABS] 2 tablet 0    Sig: TAKE 1 TABLET BY MOUTH EVERY 3 DAYS AS NEEDED FOR VAGINAL ITCHING YEAST INFECTION     Off-Protocol Failed - 10/29/2023  2:43 PM      Failed - Medication not assigned to a protocol, review manually.      Passed - Valid encounter within last 12 months    Recent Outpatient Visits           1 week ago Acute non-recurrent maxillary sinusitis   [facility] Margarita Mail, DO   3 months ago  Annual physical exam   [facility] Mail, DO   4 months ago Left ear pain   [facility] Berniece Salines, FNP   12 months ago Essential hypertension   [facility], DO   1 year ago Essential hypertension   [place] [facility] Larae Grooms, NP       Future Appointments             In 2 months Margarita Mail, DO [facility]

## 2023-10-29 NOTE — Telephone Encounter (Signed)
FYI

## 2023-10-30 NOTE — Telephone Encounter (Signed)
No answer from pt left detailed vm. °

## 2023-11-01 ENCOUNTER — Other Ambulatory Visit (HOSPITAL_COMMUNITY): Payer: Self-pay

## 2023-11-08 ENCOUNTER — Other Ambulatory Visit (HOSPITAL_COMMUNITY): Payer: Self-pay

## 2023-11-12 ENCOUNTER — Other Ambulatory Visit: Payer: Self-pay

## 2023-11-22 ENCOUNTER — Other Ambulatory Visit: Payer: Self-pay

## 2023-11-28 ENCOUNTER — Telehealth: Payer: Self-pay

## 2023-11-28 NOTE — Telephone Encounter (Signed)
 Copied from CRM [id number]. Topic: Appointments - Transfer of Care >> Nov 28, 2023  3:42 PM Debra [name] wrote: Pt is requesting to transfer FROM: [facility] Pt is requesting to transfer TO: [facility] Reason for requested transfer: Insurance  It is the responsibility of the team the patient would like to transfer to (Dr. Bethanie Dicker) to reach out to the patient if for any reason this transfer is not acceptable.

## 2023-11-28 NOTE — Telephone Encounter (Signed)
 Copied from CRM [id number]. Topic: Appointments - Transfer of Care >> Nov 28, 2023  3:42 PM Isabell A wrote: Pt is requesting to transfer FROM: [facility] Pt is requesting to transfer TO: [facility] Reason for requested transfer: Insurance  It is the responsibility of the team the patient would like to transfer to (Dr. Bethanie Dicker) to reach out to the patient if for any reason this transfer is not acceptable.

## 2023-12-04 DIAGNOSIS — S83241A Other tear of medial meniscus, current injury, right knee, initial encounter: Secondary | ICD-10-CM | POA: Diagnosis not present

## 2023-12-10 DIAGNOSIS — H1045 Other chronic allergic conjunctivitis: Secondary | ICD-10-CM | POA: Diagnosis not present

## 2023-12-10 DIAGNOSIS — J3089 Other allergic rhinitis: Secondary | ICD-10-CM | POA: Diagnosis not present

## 2023-12-11 DIAGNOSIS — T781XXA Other adverse food reactions, not elsewhere classified, initial encounter: Secondary | ICD-10-CM | POA: Diagnosis not present

## 2023-12-30 ENCOUNTER — Other Ambulatory Visit: Payer: Self-pay | Admitting: Nurse Practitioner

## 2023-12-30 DIAGNOSIS — I10 Essential (primary) hypertension: Secondary | ICD-10-CM

## 2023-12-31 NOTE — Telephone Encounter (Signed)
 Requested medications are due for refill today.  yes  Requested medications are on the active medications list.  yes  Last refill. Both 10/15/2023 #90 0 rf  Future visit scheduled.   yes  Notes to clinic.  Labs are expired.    Requested Prescriptions  Pending Prescriptions Disp Refills   hydrochlorothiazide (HYDRODIURIL) 25 MG tablet [Pharmacy Med Name: hydroCHLOROthiazide 25MG  TABS*] 28 tablet     Sig: TAKE 1 TABLET DAILY     Cardiovascular: Diuretics - Thiazide Failed - 12/31/2023  5:32 PM      Failed - Cr in normal range and within 180 days    Creat  Date Value Ref Range Status  11/02/2022 0.61 0.50 - 1.05 mg/dL Final   Creatinine, Urine  Date Value Ref Range Status  11/02/2022 52 20 - 275 mg/dL Final         Failed - K in normal range and within 180 days    Potassium  Date Value Ref Range Status  11/02/2022 4.4 3.5 - 5.3 mmol/L Final         Failed - Na in normal range and within 180 days    Sodium  Date Value Ref Range Status  11/02/2022 137 135 - 146 mmol/L Final  04/26/2022 137 134 - 144 mmol/L Final         Passed - Last BP in normal range    BP Readings from Last 1 Encounters:  10/17/23 120/80         Passed - Valid encounter within last 6 months    Recent Outpatient Visits           2 months ago Acute non-recurrent maxillary sinusitis   [facility] Mail, DO   5 months ago Annual physical exam   [facility], DO   6 months ago Left ear pain   [facility] Berniece Salines, FNP   1 year ago Essential hypertension   [place] [facility] Margarita Mail, DO   1 year ago Essential hypertension   [place] Larae Grooms, NP       Future Appointments             In 1 week Margarita Mail, DO [place] [facility]   In 6 months Margarita Mail, DO [place]  [facility]             losartan (COZAAR) 50 MG tablet [Pharmacy Med Name: Losartan Potassium 50MG  TABS] 28 tablet     Sig: TAKE 1 TABLET DAILY     Cardiovascular:  Angiotensin Receptor Blockers Failed - 12/31/2023  5:32 PM      Failed - Cr in normal range and within 180 days    Creat  Date Value Ref Range Status  11/02/2022 0.61 0.50 - 1.05 mg/dL Final   Creatinine, Urine  Date Value Ref Range Status  11/02/2022 52 20 - 275 mg/dL Final         Failed - K in normal range and within 180 days    Potassium  Date Value Ref Range Status  11/02/2022 4.4 3.5 - 5.3 mmol/L Final         Passed - Patient is not pregnant      Passed - Last BP in normal range    BP Readings from Last 1 Encounters:  10/17/23 120/80         Passed -  Valid encounter within last 6 months    Recent Outpatient Visits           2 months ago Acute non-recurrent maxillary sinusitis   [facility] Margarita Mail, DO   5 months ago Annual physical exam   [facility], DO   6 months ago Left ear pain   [facility] Berniece Salines, FNP   1 year ago Essential hypertension   [facility] Mail, DO   1 year ago Essential hypertension   [place] [facility] Larae Grooms, NP       Future Appointments             In 1 week Margarita Mail, DO [facility]   In 6 months Margarita Mail, DO [facility], [facility]

## 2024-01-01 ENCOUNTER — Other Ambulatory Visit: Payer: Self-pay | Admitting: Internal Medicine

## 2024-01-01 DIAGNOSIS — I10 Essential (primary) hypertension: Secondary | ICD-10-CM

## 2024-01-01 NOTE — Telephone Encounter (Signed)
 Copied from CRM [id number]. Topic: Clinical - Medication Refill >> Jan 01, 2024 10:58 AM Clayton Bibles wrote: Most Recent Primary Care Visit:  Provider: Margarita Mail  Department: [facility]  Visit Type: OFFICE VISIT  Date: 10/17/2023  Medication: losartan (COZAAR) 50 MG tablet AND hydrochlorothiazide (HYDRODIURIL) 25 MG tablet  Has the patient contacted their pharmacy? Yes (Agent: If no, request that the patient contact the pharmacy for the refill. If patient does not wish to contact the pharmacy document the reason why and proceed with request.) (Agent: If yes, when and what did the pharmacy advise?) Pharmacy need orders to refill both medications   Is this the correct pharmacy for this prescription? Yes If no, delete pharmacy and type the correct one.  This is the patient's preferred pharmacy:  [id number] - [place], [place] - [address] [place] [address] Phone: [phone] Fax: [phone]  Has the prescription been filled recently? No  Is the patient out of the medication? No - she has 2 tablets left   Has the patient been seen for an appointment in the last year OR does the patient have an upcoming appointment? Yes - April 7 at 3:20 PM  Can we respond through MyChart? Yes  Agent: Please be advised that Rx refills may take up to 3 business days. We ask that you follow-up with your pharmacy.

## 2024-01-02 NOTE — Telephone Encounter (Signed)
 Requested medication (s) are due for refill today: yes but has enough to last until appt on 01/13/24  Requested medication (s) are on the active medication list: yes  Last refill:  both last filled 10/15/23 #90 each  Future visit scheduled: yes  Notes to clinic:  overdue labs    Requested Prescriptions  Pending Prescriptions Disp Refills   losartan (COZAAR) 50 MG tablet 90 tablet 0    Sig: Take 1 tablet (50 mg total) by mouth daily.     Cardiovascular:  Angiotensin Receptor Blockers Failed - 01/02/2024  4:12 PM      Failed - Cr in normal range and within 180 days    Creat  Date Value Ref Range Status  11/02/2022 0.61 0.50 - 1.05 mg/dL Final   Creatinine, Urine  Date Value Ref Range Status  11/02/2022 52 20 - 275 mg/dL Final         Failed - K in normal range and within 180 days    Potassium  Date Value Ref Range Status  11/02/2022 4.4 3.5 - 5.3 mmol/L Final         Failed - Valid encounter within last 6 months    Recent Outpatient Visits   None     Future Appointments             In 1 week Margarita Mail, DO [facility]   In 6 months Margarita Mail, DO [place] [facility], [facility]            Passed - Patient is not pregnant      Passed - Last BP in normal range    BP Readings from Last 1 Encounters:  10/17/23 120/80          hydrochlorothiazide (HYDRODIURIL) 25 MG tablet 90 tablet 0    Sig: Take 1 tablet (25 mg total) by mouth daily.     Cardiovascular: Diuretics - Thiazide Failed - 01/02/2024  4:12 PM      Failed - Cr in normal range and within 180 days    Creat  Date Value Ref Range Status  11/02/2022 0.61 0.50 - 1.05 mg/dL Final   Creatinine, Urine  Date Value Ref Range Status  11/02/2022 52 20 - 275 mg/dL Final         Failed - K in normal range and within 180 days    Potassium  Date Value Ref Range Status  11/02/2022 4.4 3.5 - 5.3 mmol/L Final         Failed - Na in normal range and  within 180 days    Sodium  Date Value Ref Range Status  11/02/2022 137 135 - 146 mmol/L Final  04/26/2022 137 134 - 144 mmol/L Final         Failed - Valid encounter within last 6 months    Recent Outpatient Visits   None     Future Appointments             In 1 week Margarita Mail, DO [place] [facility]   In 6 months Margarita Mail, DO [place] [facility], [facility]            Passed - Last BP in normal range    BP Readings from Last 1 Encounters:  10/17/23 120/80

## 2024-01-13 ENCOUNTER — Ambulatory Visit: Admitting: Internal Medicine

## 2024-01-13 ENCOUNTER — Other Ambulatory Visit: Payer: Self-pay | Admitting: Internal Medicine

## 2024-01-13 ENCOUNTER — Ambulatory Visit: Payer: Self-pay | Admitting: Internal Medicine

## 2024-01-13 ENCOUNTER — Ambulatory Visit: Admitting: Nurse Practitioner

## 2024-01-13 DIAGNOSIS — E7800 Pure hypercholesterolemia, unspecified: Secondary | ICD-10-CM

## 2024-01-13 DIAGNOSIS — E1165 Type 2 diabetes mellitus with hyperglycemia: Secondary | ICD-10-CM

## 2024-01-13 DIAGNOSIS — I10 Essential (primary) hypertension: Secondary | ICD-10-CM

## 2024-01-13 MED ORDER — HYDROCHLOROTHIAZIDE 25 MG PO TABS: 25.0000 mg | ORAL_TABLET | Freq: Every day | ORAL | 0 refills | Status: DC

## 2024-01-13 MED ORDER — EZETIMIBE 10 MG PO TABS: 10.0000 mg | ORAL_TABLET | Freq: Every day | ORAL | 0 refills | Status: DC

## 2024-01-13 MED ORDER — METFORMIN HCL 1000 MG PO TABS: 1000.0000 mg | ORAL_TABLET | Freq: Two times a day (BID) | ORAL | 0 refills | Status: DC

## 2024-01-13 MED ORDER — LOSARTAN POTASSIUM 50 MG PO TABS: 50.0000 mg | ORAL_TABLET | Freq: Every day | ORAL | 0 refills | Status: DC

## 2024-01-13 NOTE — Telephone Encounter (Signed)
 Pt is seeing julie today due to dr Caralee Ates being booked

## 2024-01-13 NOTE — Telephone Encounter (Signed)
 Needs an appt

## 2024-01-13 NOTE — Telephone Encounter (Signed)
 Copied from CRM [id number]. Topic: Clinical - Medication Question >> Jan 13, 2024  9:16 AM Patsy Lager T wrote: Reason for CRM: patient called to f/u on her medications hydrochlorothiazide (HYDRODIURIL) 25 MG tablet and losartan (COZAAR) 50 MG tablet that she requested a refill on 3/27. Per provider she needed an appt but she is schedule for a transfer of care appt on 5/29 and need enough medication to get her to that appt. Patient says she took her last pill on yesterday. Please f/u with patient.

## 2024-01-23 DIAGNOSIS — K080 Exfoliation of teeth due to systemic causes: Secondary | ICD-10-CM | POA: Diagnosis not present

## 2024-02-06 DIAGNOSIS — E119 Type 2 diabetes mellitus without complications: Secondary | ICD-10-CM | POA: Diagnosis not present

## 2024-03-05 ENCOUNTER — Ambulatory Visit (INDEPENDENT_AMBULATORY_CARE_PROVIDER_SITE_OTHER): Payer: Medicare Other | Admitting: Nurse Practitioner

## 2024-03-05 VITALS — BP 122/78 | HR 88 | Temp 98.0°F | Ht 65.0 in | Wt 181.4 lb

## 2024-03-05 DIAGNOSIS — Z7984 Long term (current) use of oral hypoglycemic drugs: Secondary | ICD-10-CM

## 2024-03-05 DIAGNOSIS — I10 Essential (primary) hypertension: Secondary | ICD-10-CM | POA: Diagnosis not present

## 2024-03-05 DIAGNOSIS — E119 Type 2 diabetes mellitus without complications: Secondary | ICD-10-CM

## 2024-03-05 DIAGNOSIS — G5793 Unspecified mononeuropathy of bilateral lower limbs: Secondary | ICD-10-CM | POA: Diagnosis not present

## 2024-03-05 DIAGNOSIS — J309 Allergic rhinitis, unspecified: Secondary | ICD-10-CM

## 2024-03-05 DIAGNOSIS — E7800 Pure hypercholesterolemia, unspecified: Secondary | ICD-10-CM

## 2024-03-05 DIAGNOSIS — L309 Dermatitis, unspecified: Secondary | ICD-10-CM

## 2024-03-05 LAB — PROTEIN / CREATININE RATIO, URINE: Albumin, U: 4.6

## 2024-03-05 NOTE — Progress Notes (Signed)
 Bluford Burkitt, NP-C Phone: [phone]  Debra [name] is a 68 y.o. female who presents today to establish care.   Discussed the use of AI scribe software for clinical note transcription with the patient, who gave verbal consent to proceed.  History of Present Illness   Debra [name] is a 68 year old female with diabetes who presents to establish care with concerns about blood sugar control.  She has been experiencing consistently high blood sugar levels despite taking metformin . Her blood sugar readings are often over 140 mg/dL, with occasional readings as low as 93 mg/dL. She takes metformin  twice daily, one tablet in the morning and one in the evening, but feels it is not effective. She experiences fatigue, blurry vision, excessive thirst, excessive urination, and dry mouth, particularly at night. Tingling in her feet is becoming more noticeable and sometimes affects her ability to walk. She has not been on any diabetes medication other than metformin .  Her diet includes a protein shake for breakfast, plain Cheerios as a snack, chicken and rice soup for lunch, and sugar-free cookies. She avoids candy and sweets, having quit eating gummies a month ago. She occasionally consumes bread, pasta, and potatoes, but limits these to about once a week. She is lactose intolerant but can tolerate string cheese.  She uses Flonase, saline solution, and allergy eye drops for seasonal allergies. She occasionally experiences dizziness, particularly with quick movements, and sometimes feels hypoglycemic when she hasn't eaten for a while.  She is on losartan  and hydrochlorothiazide  for blood pressure and takes Repatha  and Zetia  for high cholesterol. She has been released from cardiology care.  She reports itching of the nipple for the past year, which improves with hydrocortisone cream. She performs regular breast exams due to a family history of breast cancer.  She has a family history of diabetes, high blood  pressure, high cholesterol, and breast cancer. She takes B12 supplements daily, 1000 mcg, and has not had an A1c check since October of the previous year.      Active Ambulatory Problems    Diagnosis Date Noted   Benign neoplasm of colon 03/30/2015   Hypercholesteremia 03/30/2015   RAD (reactive airway disease) 03/30/2015   Arthralgia of temporomandibular joint 03/30/2015   H/O adenomatous polyp of colon 08/13/2014   HNP (herniated nucleus pulposus), lumbar 10/16/2017   Spinal stenosis of lumbar region 10/16/2017   Plantar fasciitis of left foot 10/30/2017   Radiculopathy, lumbosacral region 09/02/2017   Family history of breast cancer in mother 09/02/2018   GERD (gastroesophageal reflux disease) 09/16/2019   Essential hypertension 09/16/2019   History of colonic polyps    Polyp of ascending colon    Diabetes type 2, controlled (HCC) 02/16/2021   Non-recurrent acute serous otitis media of right ear 01/29/2022   Allergic rhinitis 03/11/2024   Neuropathy of both feet 03/11/2024   Nipple dermatitis 03/11/2024   Resolved Ambulatory Problems    Diagnosis Date Noted   Dizziness 03/30/2015   Abnormal maternal glucose tolerance, complicating pregnancy 03/30/2015   Cannot sleep 03/30/2015   Borderline diabetes 03/30/2015   Avitaminosis D 03/30/2015   H/O gestational diabetes mellitus, not currently pregnant 04/01/2015   Spinal stenosis of lumbar region 10/16/2017   Cervical cancer screening 05/06/2020   Upper respiratory tract infection 03/03/2021   Past Medical History:  Diagnosis Date   Allergies    Grade I diastolic dysfunction    Hyperlipidemia    Hypertension    Lumbar back pain    Pre-diabetes  Family History  Adopted: Yes  Problem Relation Age of Onset   Breast cancer Mother 72   Alcohol abuse Mother    Hyperlipidemia Sister    Hyperlipidemia Brother    Hyperlipidemia Brother    Obesity Son    Hypertension Son     Social History   Socioeconomic History    Marital status: Married    Spouse name: Not on file   Number of children: Not on file   Years of education: Not on file   Highest education level: Associate degree: occupational, Scientist, product/process development, or vocational program  Occupational History   Not on file  Tobacco Use   Smoking status: Former    Types: Cigarettes   Smokeless tobacco: Never   Tobacco comments:    33 years quit   Vaping Use   Vaping status: Never Used  Substance and Sexual Activity   Alcohol use: Yes    Comment: Socially   Drug use: No   Sexual activity: Not Currently  Other Topics Concern   Not on file  Social History Narrative   Not on file   Social Drivers of Health   Financial Resource Strain: Low Risk  (10/17/2023)   Overall Financial Resource Strain (CARDIA)    Difficulty of Paying Living Expenses: Not very hard  Food Insecurity: No Food Insecurity (10/17/2023)   Hunger Vital Sign    Worried About Running Out of Food in the Last Year: Never true    Ran Out of Food in the Last Year: Never true  Transportation Needs: No Transportation Needs (10/17/2023)   PRAPARE - Administrator, Civil Service (Medical): No    Lack of Transportation (Non-Medical): No  Physical Activity: Insufficiently Active (10/17/2023)   Exercise Vital Sign    Days of Exercise per Week: 3 days    Minutes of Exercise per Session: 30 min  Stress: No Stress Concern Present (10/17/2023)   [employer] of Occupational Health - Occupational Stress Questionnaire    Feeling of Stress : Only a little  Social Connections: Unknown (10/17/2023)   Social Connection and Isolation Panel [NHANES]    Frequency of Communication with Friends and Family: Three times a week    Frequency of Social Gatherings with Friends and Family: Once a week    Attends Religious Services: Patient declined    Database administrator or Organizations: Yes    Attends Engineer, structural: More than 4 times per year    Marital Status: Married  Catering manager  Violence: Not At Risk (07/12/2023)   Humiliation, Afraid, Rape, and Kick questionnaire    Fear of Current or Ex-Partner: No    Emotionally Abused: No    Physically Abused: No    Sexually Abused: No    ROS  General:  Negative for unexplained weight loss, fever Skin: Negative for new or changing mole, sore that won't heal HEENT: Negative for trouble hearing, trouble seeing, ringing in ears, mouth sores, hoarseness, change in voice, dysphagia. CV:  Negative for chest pain, dyspnea, edema, palpitations Resp: Negative for cough, dyspnea, hemoptysis GI: Negative for nausea, vomiting, diarrhea, constipation, abdominal pain, melena, hematochezia. GU: Negative for dysuria, incontinence, urinary hesitance, hematuria, vaginal or penile discharge, polyuria, sexual difficulty, lumps in testicle or breasts MSK: Negative for muscle cramps or aches, joint pain or swelling Neuro: Negative for headaches, weakness, numbness, dizziness, passing out/fainting Psych: Negative for depression, anxiety, memory problems  Objective  Physical Exam Vitals:   03/05/24 1456  BP: 122/78  Pulse: 88  Temp: 98 F (36.7 C)  SpO2: 97%    BP Readings from Last 3 Encounters:  03/05/24 122/78  10/17/23 120/80  07/12/23 136/76   Wt Readings from Last 3 Encounters:  03/05/24 181 lb 6.4 oz (82.3 kg)  10/17/23 176 lb 14.4 oz (80.2 kg)  07/12/23 174 lb (78.9 kg)    Physical Exam Constitutional:      General: She is not in acute distress.    Appearance: Normal appearance.  HENT:     Head: Normocephalic.  Cardiovascular:     Rate and Rhythm: Normal rate and regular rhythm.     Heart sounds: Normal heart sounds.  Pulmonary:     Effort: Pulmonary effort is normal.     Breath sounds: Normal breath sounds.  Skin:    General: Skin is warm and dry.  Neurological:     General: No focal deficit present.     Mental Status: She is alert.  Psychiatric:        Mood and Affect: Mood normal.        Behavior:  Behavior normal.      Assessment/Plan:   Controlled type 2 diabetes mellitus without complication, without long-term current use of insulin (HCC) Assessment & Plan: Blood glucose levels consistently exceed 140 mg/dL, indicating hyperglycemia. Current metformin  regimen may be insufficient. Last A1c in October was 6.2%. She experiences glucose fluctuations. Check A1c today. Consider additional diabetes medication based on A1c results. Encourage regular blood glucose monitoring and advise dietary modifications to stabilize blood glucose, including high protein snacks. Educated on recognizing symptoms of hyperglycemia and hypoglycemia and the importance of regular meals.  Orders: -     Hemoglobin A1c  Essential hypertension Assessment & Plan: Managed with losartan  and hydrochlorothiazide . She reports occasional dizziness, possibly related to orthostatic changes or vertigo. Blood pressure at goal in office. Continue Losartan  50 mg daily and hydrochlorothiazide  25 mg daily. Check CMP today. Encouraged to monitor blood pressure at home.   Orders: -     Comprehensive metabolic panel with GFR  Hypercholesteremia Assessment & Plan: Managed with Repatha  and Zetia . Considering discontinuing Zetia  per cardiologist's suggestion, with a goal of LDL under 70 mg/dL. Check cholesterol levels at next physical in October. Encourage healthy diet and regular exercise.   Orders: -     Comprehensive metabolic panel with GFR  Neuropathy of both feet Assessment & Plan: She experiences tingling in her feet, possibly related to diabetes or B12 deficiency. Check B12 level today.   Orders: -     Vitamin B12 -     TSH  Allergic rhinitis, unspecified seasonality, unspecified trigger Assessment & Plan: Managed with over-the-counter medications. Symptoms include occasional dizziness and congestion. She reports shortness of breath with exertion, possibly related to allergies. Continue Flonase and eye drops,  consider daily Zyrtec or Claritin for additional symptom relief.    Nipple dermatitis Assessment & Plan: Persistent nipple itching for the past year, with no rash or dryness. Family history of breast cancer noted. Recommend lanolin for itching. Continue regular breast exams and follow up with mammogram in September.      Return in about 4 months (around 07/14/2024) for Annual Exam.   Bluford Burkitt, NP-C [facility]

## 2024-03-06 ENCOUNTER — Ambulatory Visit: Payer: Self-pay | Admitting: Nurse Practitioner

## 2024-03-06 LAB — HEMOGLOBIN A1C: Hgb A1c MFr Bld: 6.4 % (ref 4.6–6.5)

## 2024-03-06 LAB — COMPREHENSIVE METABOLIC PANEL WITH GFR
ALT: 43 U/L — ABNORMAL HIGH (ref 0–35)
AST: 23 U/L (ref 0–37)
Albumin: 4.6 g/dL (ref 3.5–5.2)
Alkaline Phosphatase: 70 U/L (ref 39–117)
BUN: 17 mg/dL (ref 6–23)
CO2: 28 meq/L (ref 19–32)
Calcium: 9.7 mg/dL (ref 8.4–10.5)
Chloride: 97 meq/L (ref 96–112)
Creatinine, Ser: 0.65 mg/dL (ref 0.40–1.20)
GFR: 92.24 mL/min
Glucose, Bld: 106 mg/dL — ABNORMAL HIGH (ref 70–99)
Potassium: 3.9 meq/L (ref 3.5–5.1)
Sodium: 138 meq/L (ref 135–145)
Total Bilirubin: 0.3 mg/dL (ref 0.2–1.2)
Total Protein: 7.6 g/dL (ref 6.0–8.3)

## 2024-03-06 LAB — VITAMIN B12: Vitamin B-12: 647 pg/mL (ref 211–911)

## 2024-03-06 LAB — TSH: TSH: 2.13 u[IU]/mL (ref 0.35–5.50)

## 2024-03-08 DIAGNOSIS — E119 Type 2 diabetes mellitus without complications: Secondary | ICD-10-CM | POA: Diagnosis not present

## 2024-03-11 ENCOUNTER — Encounter: Payer: Self-pay | Admitting: Nurse Practitioner

## 2024-03-11 DIAGNOSIS — L309 Dermatitis, unspecified: Secondary | ICD-10-CM | POA: Insufficient documentation

## 2024-03-11 DIAGNOSIS — G5793 Unspecified mononeuropathy of bilateral lower limbs: Secondary | ICD-10-CM | POA: Insufficient documentation

## 2024-03-11 DIAGNOSIS — J309 Allergic rhinitis, unspecified: Secondary | ICD-10-CM | POA: Insufficient documentation

## 2024-03-11 NOTE — Assessment & Plan Note (Signed)
 Persistent nipple itching for the past year, with no rash or dryness. Family history of breast cancer noted. Recommend lanolin for itching. Continue regular breast exams and follow up with mammogram in September.

## 2024-03-11 NOTE — Assessment & Plan Note (Signed)
 She experiences tingling in her feet, possibly related to diabetes or B12 deficiency. Check B12 level today.

## 2024-03-11 NOTE — Assessment & Plan Note (Signed)
 Managed with Repatha  and Zetia . Considering discontinuing Zetia  per cardiologist's suggestion, with a goal of LDL under 70 mg/dL. Check cholesterol levels at next physical in October. Encourage healthy diet and regular exercise.

## 2024-03-11 NOTE — Telephone Encounter (Signed)
 Copied from CRM [id number]. Topic: Clinical - Lab/Test Results >> Mar 11, 2024  8:58 AM Albertha Alosa wrote: Reason for CRM: Patient called in wanted nurse to give her a detailed explanation of her lab results  [phone]

## 2024-03-11 NOTE — Assessment & Plan Note (Signed)
 Blood glucose levels consistently exceed 140 mg/dL, indicating hyperglycemia. Current metformin  regimen may be insufficient. Last A1c in October was 6.2%. She experiences glucose fluctuations. Check A1c today. Consider additional diabetes medication based on A1c results. Encourage regular blood glucose monitoring and advise dietary modifications to stabilize blood glucose, including high protein snacks. Educated on recognizing symptoms of hyperglycemia and hypoglycemia and the importance of regular meals.

## 2024-03-11 NOTE — Assessment & Plan Note (Signed)
 Managed with over-the-counter medications. Symptoms include occasional dizziness and congestion. She reports shortness of breath with exertion, possibly related to allergies. Continue Flonase and eye drops, consider daily Zyrtec or Claritin for additional symptom relief.

## 2024-03-11 NOTE — Assessment & Plan Note (Signed)
 Managed with losartan  and hydrochlorothiazide . She reports occasional dizziness, possibly related to orthostatic changes or vertigo. Blood pressure at goal in office. Continue Losartan  50 mg daily and hydrochlorothiazide  25 mg daily. Check CMP today. Encouraged to monitor blood pressure at home.

## 2024-03-17 ENCOUNTER — Other Ambulatory Visit: Payer: Self-pay | Admitting: Nurse Practitioner

## 2024-03-17 ENCOUNTER — Telehealth: Payer: Self-pay

## 2024-03-17 DIAGNOSIS — R748 Abnormal levels of other serum enzymes: Secondary | ICD-10-CM

## 2024-03-17 NOTE — Telephone Encounter (Signed)
Pt informed

## 2024-03-17 NOTE — Telephone Encounter (Signed)
 Copied from CRM [id number]. Topic: Appointments - Scheduling Inquiry for Clinic >> Mar 17, 2024 10:54 AM Lajean Pike wrote: Reason for CRM: Patient called in and stated that she has not received a call back yet in regards to scheduling for her ultrasound for her liver. Patient may be contacted at [phone].

## 2024-03-19 ENCOUNTER — Ambulatory Visit
Admission: RE | Admit: 2024-03-19 | Discharge: 2024-03-19 | Disposition: A | Discharge status: Home / Self Care | Source: Ambulatory Visit | Attending: Nurse Practitioner | Admitting: Nurse Practitioner

## 2024-03-19 DIAGNOSIS — K828 Other specified diseases of gallbladder: Secondary | ICD-10-CM | POA: Diagnosis not present

## 2024-03-19 DIAGNOSIS — R748 Abnormal levels of other serum enzymes: Secondary | ICD-10-CM | POA: Insufficient documentation

## 2024-04-02 ENCOUNTER — Telehealth: Payer: Self-pay

## 2024-04-02 ENCOUNTER — Ambulatory Visit: Payer: Self-pay | Admitting: Nurse Practitioner

## 2024-04-02 ENCOUNTER — Other Ambulatory Visit: Payer: Self-pay | Admitting: Nurse Practitioner

## 2024-04-02 DIAGNOSIS — K8020 Calculus of gallbladder without cholecystitis without obstruction: Secondary | ICD-10-CM

## 2024-04-02 NOTE — Telephone Encounter (Signed)
 Pt would like to know the results of her US  that was done on 03/19/2024.

## 2024-04-02 NOTE — Telephone Encounter (Signed)
 noted

## 2024-04-02 NOTE — Telephone Encounter (Signed)
 Spoke with pt and informed her of her results. Pt stated that she would like to go forward with the general surgery referral. She would also like to know if you thought the MRI for further evaluation was needed based off of the results.

## 2024-04-02 NOTE — Telephone Encounter (Signed)
 Copied from CRM [id number]. Topic: General - Other >> Apr 02, 2024  1:39 PM Debra [name] wrote: Reason for CRM: patient called wanting to know if there is another doctor that can look into her MyChart and tell her what is going on. The patient stated she is not getting any response from NP [facility]. Patient had an ultrasound two weeks ago and have not heard a response back regarding it and this is the first time she had to call CB [phone]

## 2024-04-03 NOTE — Telephone Encounter (Signed)
 LMTCB. Please relay message below to pt when she returns the call. D   I will place referral to Gen Surgery for evaluation. I do not feel that it is necessary at this time. If she starts having right upper quadrant pain or her liver enzymes increase significantly we can proceed with the scan, unless she absolutely wants to have it. I do not mind to order it for her.

## 2024-04-07 ENCOUNTER — Other Ambulatory Visit: Payer: Self-pay | Admitting: Nurse Practitioner

## 2024-04-07 ENCOUNTER — Other Ambulatory Visit: Payer: Self-pay | Admitting: Internal Medicine

## 2024-04-07 DIAGNOSIS — E1165 Type 2 diabetes mellitus with hyperglycemia: Secondary | ICD-10-CM

## 2024-04-07 DIAGNOSIS — I10 Essential (primary) hypertension: Secondary | ICD-10-CM

## 2024-04-07 DIAGNOSIS — E119 Type 2 diabetes mellitus without complications: Secondary | ICD-10-CM | POA: Diagnosis not present

## 2024-04-07 NOTE — Telephone Encounter (Signed)
 Last Fill: Repatha : 10/28/23     Hydrochlorothiazide : 01/13/24     Losartan : 01/13/24     Metformin : 01/13/24  Last OV: 03/05/24 Next OV: 06/19/24  Routing to provider for review/authorization.

## 2024-04-07 NOTE — Telephone Encounter (Unsigned)
 Copied from CRM [id number]. Topic: Clinical - Medication Refill >> Apr 07, 2024  9:31 AM Tanazia G wrote: Medication: Evolocumab  (REPATHA  SURECLICK) 140 MG/ML SOAJ  hydrochlorothiazide  (HYDRODIURIL ) 25 MG tablet  losartan  (COZAAR ) 50 MG tablet  metFORMIN  (GLUCOPHAGE ) 1000 MG tablet     Has the patient contacted their pharmacy? Yes (Agent: If no, request that the patient contact the pharmacy for the refill. If patient does not wish to contact the pharmacy document the reason why and proceed with request.) (Agent: If yes, when and what did the pharmacy advise?)  This is the patient's preferred pharmacy:  [id number] - [place], [place] - [address] [place] [address] Phone: [phone] Fax: [phone]  Is this the correct pharmacy for this prescription? Yes If no, delete pharmacy and type the correct one.   Has the prescription been filled recently? Yes  Is the patient out of the medication? Yes  Has the patient been seen for an appointment in the last year OR does the patient have an upcoming appointment? Yes  Can we respond through MyChart? Yes  Agent: Please be advised that Rx refills may take up to 3 business days. We ask that you follow-up with your pharmacy.

## 2024-04-08 MED ORDER — REPATHA SURECLICK 140 MG/ML ~~LOC~~ SOAJ: 140.0000 mg | SUBCUTANEOUS | 1 refills | Status: DC

## 2024-04-08 MED ORDER — METFORMIN HCL 1000 MG PO TABS: 1000.0000 mg | ORAL_TABLET | Freq: Two times a day (BID) | ORAL | 1 refills | Status: DC

## 2024-04-08 MED ORDER — HYDROCHLOROTHIAZIDE 25 MG PO TABS: 25.0000 mg | ORAL_TABLET | Freq: Every day | ORAL | 1 refills | Status: DC

## 2024-04-08 MED ORDER — LOSARTAN POTASSIUM 50 MG PO TABS: 50.0000 mg | ORAL_TABLET | Freq: Every day | ORAL | 0 refills | Status: DC

## 2024-04-08 NOTE — Telephone Encounter (Signed)
 Duplicate request, refilled 04/08/24.  Requested Prescriptions  Pending Prescriptions Disp Refills   losartan  (COZAAR ) 50 MG tablet [Pharmacy Med Name: Losartan  Potassium 50MG  TABS] 30 tablet     Sig: TAKE 1 TABLET (50 MG TOTAL) BY MOUTH DAILY.     Cardiovascular:  Angiotensin Receptor Blockers Failed - 04/08/2024  4:57 PM      Failed - Valid encounter within last 6 months    Recent Outpatient Visits   None            Passed - Cr in normal range and within 180 days    Creat  Date Value Ref Range Status  11/02/2022 0.61 0.50 - 1.05 mg/dL Final   Creatinine, Ser  Date Value Ref Range Status  03/05/2024 0.65 0.40 - 1.20 mg/dL Final   Creatinine, Urine  Date Value Ref Range Status  11/02/2022 52 20 - 275 mg/dL Final         Passed - K in normal range and within 180 days    Potassium  Date Value Ref Range Status  03/05/2024 3.9 3.5 - 5.1 mEq/L Final         Passed - Patient is not pregnant      Passed - Last BP in normal range    BP Readings from Last 1 Encounters:  03/05/24 122/78          metFORMIN  (GLUCOPHAGE ) 1000 MG tablet [Pharmacy Med Name: metFORMIN  HCl 1000MG  TABS*] 60 tablet     Sig: TAKE 1 TABLET TWICE A DAY WITH A MEAL     Endocrinology:  Diabetes - Biguanides Failed - 04/08/2024  4:57 PM      Failed - Valid encounter within last 6 months    Recent Outpatient Visits   None            Failed - CBC within normal limits and completed in the last 12 months    WBC  Date Value Ref Range Status  11/02/2022 8.4 3.8 - 10.8 Thousand/uL Final   RBC  Date Value Ref Range Status  11/02/2022 4.68 3.80 - 5.10 Million/uL Final   Hemoglobin  Date Value Ref Range Status  11/02/2022 14.0 11.7 - 15.5 g/dL Final  88/88/7977 85.4 11.1 - 15.9 g/dL Final   HCT  Date Value Ref Range Status  11/02/2022 41.6 35.0 - 45.0 % Final   Hematocrit  Date Value Ref Range Status  08/18/2021 43.1 34.0 - 46.6 % Final   MCHC  Date Value Ref Range Status  11/02/2022 33.7  32.0 - 36.0 g/dL Final   [facility]  Date Value Ref Range Status  11/02/2022 29.9 27.0 - 33.0 pg Final   MCV  Date Value Ref Range Status  11/02/2022 88.9 80.0 - 100.0 fL Final  08/18/2021 89 79 - 97 fL Final   No results found for: PLTCOUNTKUC, LABPLAT, POCPLA RDW  Date Value Ref Range Status  11/02/2022 12.5 11.0 - 15.0 % Final  08/18/2021 12.7 11.7 - 15.4 % Final         Passed - Cr in normal range and within 360 days    Creat  Date Value Ref Range Status  11/02/2022 0.61 0.50 - 1.05 mg/dL Final   Creatinine, Ser  Date Value Ref Range Status  03/05/2024 0.65 0.40 - 1.20 mg/dL Final   Creatinine, Urine  Date Value Ref Range Status  11/02/2022 52 20 - 275 mg/dL Final         Passed - HBA1C is between 0 and 7.9  and within 180 days    Hgb A1c MFr Bld  Date Value Ref Range Status  03/05/2024 6.4 4.6 - 6.5 % Final    Comment:    Glycemic Control Guidelines for People with Diabetes:Non Diabetic:  <6%Goal of Therapy: <7%Additional Action Suggested:  >8%          Passed - eGFR in normal range and within 360 days    GFR, Est African American  Date Value Ref Range Status  08/16/2017 94 > OR = 60 mL/min/1.64m2 Final   GFR calc Af Amer  Date Value Ref Range Status  05/11/2020 109 >59 mL/min/1.73 Final    Comment:    **[facility] currently reports eGFR in compliance with the current**   recommendations of the [employer]. [facility] will   update reporting as new guidelines are published from the NKF-ASN   Task force.    GFR, Est Non African American  Date Value Ref Range Status  08/16/2017 81 > OR = 60 mL/min/1.23m2 Final   GFR calc non Af Amer  Date Value Ref Range Status  05/11/2020 94 >59 mL/min/1.73 Final   GFR  Date Value Ref Range Status  03/05/2024 92.24 >60.00 mL/min Final    Comment:    Calculated using the CKD-EPI Creatinine Equation (2021)   eGFR  Date Value Ref Range Status  11/02/2022 100 > OR = 60 mL/min/1.41m2 Final  04/26/2022 99  >59 mL/min/1.73 Final         Passed - B12 Level in normal range and within 720 days    Vitamin B-12  Date Value Ref Range Status  03/05/2024 647 211 - 911 pg/mL Final

## 2024-04-09 NOTE — Telephone Encounter (Signed)
 Spoke with pt and she stated that she has an appt scheduled with general surgery. She also stated that she will hold off on the scan for now.

## 2024-04-13 ENCOUNTER — Ambulatory Visit: Payer: Self-pay | Admitting: Surgery

## 2024-04-17 ENCOUNTER — Other Ambulatory Visit: Payer: Self-pay

## 2024-04-17 ENCOUNTER — Telehealth: Payer: Self-pay | Admitting: Nurse Practitioner

## 2024-04-17 DIAGNOSIS — Z1231 Encounter for screening mammogram for malignant neoplasm of breast: Secondary | ICD-10-CM

## 2024-04-17 NOTE — Telephone Encounter (Signed)
 Copied from CRM [id number]. Topic: General - Other >> Apr 17, 2024  9:03 AM Martinique E wrote: Reason for CRM: Patient called stating she needs PCP or care team member to send a letter of request for a mammogram to [facility], patient did not have fax number for this facility, but her callback number is [phone].

## 2024-04-17 NOTE — Telephone Encounter (Signed)
VM left to CB.

## 2024-04-17 NOTE — Telephone Encounter (Unsigned)
 Copied from CRM [id number]. Topic: General - Call Back - No Documentation >> Apr 17, 2024  1:41 PM Franky GRADE wrote: Reason for CRM: Patient returning a call she received from the office; however, no documentation on file. Called [facility] and was informed that no one has called the patient but the previous CRM was forwarded to [employer].

## 2024-04-20 ENCOUNTER — Encounter: Payer: Self-pay | Admitting: Surgery

## 2024-04-20 ENCOUNTER — Ambulatory Visit: Payer: Self-pay | Admitting: Surgery

## 2024-04-20 VITALS — BP 138/77 | HR 91 | Temp 98.1°F | Ht 65.0 in | Wt 177.0 lb

## 2024-04-20 DIAGNOSIS — K8020 Calculus of gallbladder without cholecystitis without obstruction: Secondary | ICD-10-CM

## 2024-04-20 DIAGNOSIS — K760 Fatty (change of) liver, not elsewhere classified: Secondary | ICD-10-CM | POA: Diagnosis not present

## 2024-04-20 NOTE — Patient Instructions (Signed)
 You have requested to have your gallbladder removed. This will be done at [facility] with Dr. Aleen Campi.  If you are on any injectable weight loss medication, you will need to stop taking your GLP-1 injectable (weight loss) medications 8 days before your surgery to avoid any complications with anesthesia.   You will most likely be out of work 1-2 weeks for this surgery.  If you have FMLA or disability paperwork that needs filled out you may drop this off at our office or this can be faxed to [phone].  You will return after your post-op appointment with a lifting restriction for approximately 4 more weeks.  You will be able to eat anything you would like to following surgery. But, start by eating a bland diet and advance this as tolerated. The Gallbladder diet is below, please go as closely by this diet as possible prior to surgery to avoid any further attacks.  Please see the (blue)pre-care form that you have been given today. Our surgery scheduler will call you to verify surgery date and to go over information.   If you have any questions, please call our office.  Laparoscopic Cholecystectomy Laparoscopic cholecystectomy is surgery to remove the gallbladder. The gallbladder is located in the upper right part of the abdomen, behind the liver. It is a storage sac for bile, which is produced in the liver. Bile aids in the digestion and absorption of fats. Cholecystectomy is often done for inflammation of the gallbladder (cholecystitis). This condition is usually caused by a buildup of gallstones (cholelithiasis) in the gallbladder. Gallstones can block the flow of bile, and that can result in inflammation and pain. In severe cases, emergency surgery may be required. If emergency surgery is not required, you will have time to prepare for the procedure. Laparoscopic surgery is an alternative to open surgery. Laparoscopic surgery has a shorter recovery time. Your common bile duct may also  need to be examined during the procedure. If stones are found in the common bile duct, they may be removed. LET [facility] CARE PROVIDER KNOW ABOUT: Any allergies you have. All medicines you are taking, including vitamins, herbs, eye drops, creams, and over-the-counter medicines. Previous problems you or members of your family have had with the use of anesthetics. Any blood disorders you have. Previous surgeries you have had.  Any medical conditions you have. RISKS AND COMPLICATIONS Generally, this is a safe procedure. However, problems may occur, including: Infection. Bleeding. Allergic reactions to medicines. Damage to other structures or organs. A stone remaining in the common bile duct. A bile leak from the cyst duct that is clipped when your gallbladder is removed. The need to convert to open surgery, which requires a larger incision in the abdomen. This may be necessary if your surgeon thinks that it is not safe to continue with a laparoscopic procedure. BEFORE THE PROCEDURE Ask your health care provider about: Changing or stopping your regular medicines. This is especially important if you are taking diabetes medicines or blood thinners. Taking medicines such as aspirin and ibuprofen. These medicines can thin your blood. Do not take these medicines before your procedure if your health care provider instructs you not to. Follow instructions from your health care provider about eating or drinking restrictions. Let your health care provider know if you develop a cold or an infection before surgery. Plan to have someone take you home after the procedure. Ask your health care provider how your surgical site will be marked or identified. You may  be given antibiotic medicine to help prevent infection. PROCEDURE To reduce your risk of infection: Your health care team will wash or sanitize their hands. Your skin will be washed with soap. An IV tube may be inserted into one of your  veins. You will be given a medicine to make you fall asleep (general anesthetic). A breathing tube will be placed in your mouth. The surgeon will make several small cuts (incisions) in your abdomen. A thin, lighted tube (laparoscope) that has a tiny camera on the end will be inserted through one of the small incisions. The camera on the laparoscope will send a picture to a TV screen (monitor) in the operating room. This will give the surgeon a good view inside your abdomen. A gas will be pumped into your abdomen. This will expand your abdomen to give the surgeon more room to perform the surgery. Other tools that are needed for the procedure will be inserted through the other incisions. The gallbladder will be removed through one of the incisions. After your gallbladder has been removed, the incisions will be closed with stitches (sutures), staples, or skin glue. Your incisions may be covered with a bandage (dressing). The procedure may vary among health care providers and hospitals. AFTER THE PROCEDURE Your blood pressure, heart rate, breathing rate, and blood oxygen level will be monitored often until the medicines you were given have worn off. You will be given medicines as needed to control your pain.   This information is not intended to replace advice given to you by your health care provider. Make sure you discuss any questions you have with your health care provider.   Document Released: 09/24/2005 Document Revised: 06/15/2015 Document Reviewed: 05/06/2013 Elsevier Interactive Patient Education 2016 [employer] Inc[employer]   Low-Fat Diet for Gallbladder Conditions A low-fat diet can be helpful if you have pancreatitis or a gallbladder condition. With these conditions, your pancreas and gallbladder have trouble digesting fats. A healthy eating plan with less fat will help rest your pancreas and gallbladder and reduce your symptoms. WHAT DO I NEED TO KNOW ABOUT THIS DIET? Eat a low-fat  diet. Reduce your fat intake to less than 20-30% of your total daily calories. This is less than 50-60 g of fat per day. Remember that you need some fat in your diet. Ask your dietician what your daily goal should be. Choose nonfat and low-fat healthy foods. Look for the words "nonfat," "low fat," or "fat free." As a guide, look on the label and choose foods with less than 3 g of fat per serving. Eat only one serving. Avoid alcohol. Do not smoke. If you need help quitting, talk with your health care provider. Eat small frequent meals instead of three large heavy meals. WHAT FOODS CAN I EAT? Grains Include healthy grains and starches such as potatoes, wheat bread, fiber-rich cereal, and brown rice. Choose whole grain options whenever possible. In adults, whole grains should account for 45-65% of your daily calories.  Fruits and Vegetables Eat plenty of fruits and vegetables. Fresh fruits and vegetables add fiber to your diet. Meats and Other Protein Sources Eat lean meat such as chicken and pork. Trim any fat off of meat before cooking it. Eggs, fish, and beans are other sources of protein. In adults, these foods should account for 10-35% of your daily calories. Dairy Choose low-fat milk and dairy options. Dairy includes fat and protein, as well as calcium.  Fats and Oils Limit high-fat foods such as fried foods, sweets, baked  goods, sugary drinks.  Other Creamy sauces and condiments, such as mayonnaise, can add extra fat. Think about whether or not you need to use them, or use smaller amounts or low fat options. WHAT FOODS ARE NOT RECOMMENDED? High fat foods, such as: [employer]. Ice cream. [place] toast. Sweet rolls. Pizza. Cheese bread. Foods covered with batter, butter, creamy sauces, or cheese. Fried foods. Sugary drinks and desserts. Foods that cause gas or bloating   This information is not intended to replace advice given to you by your health care provider. Make sure you  discuss any questions you have with your health care provider.   Document Released: 09/29/2013 Document Reviewed: 09/29/2013 Elsevier Interactive Patient Education [employer]

## 2024-04-20 NOTE — Progress Notes (Signed)
 04/20/2024  Reason for Visit:  Cholelithiasis  Requesting Provider:  Leron Glance, NP  History of Present Illness: Debra [name] is a 68 y.o. female presenting for evaluation of newly found cholelithiasis.  The patient visit with her PCP on 03/05/2024 and part of the regular laboratory studies including a CMP which had an elevated ALT of 43.  An ultrasound of the right upper quadrant was obtained on 03/19/2024 which showed hepatic steatosis as well as cholelithiasis.  She was referred to general surgery for further evaluation of her cholelithiasis.  At first the patient thought that she was otherwise asymptomatic but now thinking back with the finding of cholelithiasis and some of the symptoms that she is whereupon that she probably has had some mild episodes of biliary colic in the past with discomfort in the right upper quadrant area after eating.  She had some issues after eating beans that she thought was more related to gas issues but more recently she has also had discomfort after eating pizza.  Denies any nausea or vomiting, fevers or chills.  She does report having diarrhea recently.  Past Medical History: Past Medical History:  Diagnosis Date   Allergies    Borderline diabetes 03/30/2015   GERD (gastroesophageal reflux disease)    Grade I diastolic dysfunction    Hyperlipidemia    Hypertension    Lumbar back pain    Pre-diabetes    during pregnancy      Past Surgical History: Past Surgical History:  Procedure Laterality Date   CARPAL TUNNEL RELEASE Right    CHONDROPLASTY Right 03/01/2023   Procedure: Right medial meniscus root repair, chondroplasty;  Surgeon: Tobie Priest, MD;  Location: [facility];  Service: Orthopedics;  Laterality: Right;   COLONOSCOPY WITH PROPOFOL  N/A 12/30/2020   Procedure: COLONOSCOPY WITH PROPOFOL ;  Surgeon: Jinny Carmine, MD;  Location: [facility];  Service: Endoscopy;  Laterality: N/A;   KNEE ARTHROSCOPY WITH MENISCAL REPAIR Right  03/01/2023   Procedure: Right medial meniscus root repair, chondroplasty;  Surgeon: Tobie Priest, MD;  Location: [facility];  Service: Orthopedics;  Laterality: Right;   LUMBAR LAMINECTOMY/DECOMPRESSION MICRODISCECTOMY Left 10/16/2017   Procedure: Microlumbar decompression L4-5, L5-S1 left;  Surgeon: Duwayne Purchase, MD;  Location: [facility];  Service: Orthopedics;  Laterality: Left;  120 mins   PLANTAR FASCIA RELEASE Left    POLYPECTOMY  12/30/2020   Procedure: POLYPECTOMY;  Surgeon: Jinny Carmine, MD;  Location: [facility];  Service: Endoscopy;;   RHINOPLASTY  1988   SHOULDER SURGERY Right 2011   TEMPOROMANDIBULAR JOINT SURGERY  1990   TOTAL ABDOMINAL HYSTERECTOMY  1998    Home Medications: Prior to Admission medications   Medication Sig Start Date End Date Taking? Authorizing Provider  Biotin 1000 MCG CHEW Chew 1 each by mouth daily.    [provider]  EPINEPHrine  0.3 mg/0.3 mL IJ SOAJ injection Inject 0.3 mg into the muscle as needed for anaphylaxis. 07/15/23   Bernardo Fend, DO  Evolocumab  (REPATHA  SURECLICK) 140 MG/ML SOAJ Inject 140 mg into the skin every 14 (fourteen) days. 04/08/24   Glance Leron, NP  hydrochlorothiazide  (HYDRODIURIL ) 25 MG tablet Take 1 tablet (25 mg total) by mouth daily. 04/08/24   Glance Leron, NP  losartan  (COZAAR ) 50 MG tablet Take 1 tablet (50 mg total) by mouth daily. 04/08/24   Glance Leron, NP  Melatonin 10 MG TABS Take 1 tablet by mouth at bedtime.     [provider]  metFORMIN  (GLUCOPHAGE ) 1000 MG tablet Take  1 tablet (1,000 mg total) by mouth 2 (two) times daily with a meal. 04/08/24   Gretel App, NP    Allergies: Allergies  Allergen Reactions   Fenofibrate  Other (See Comments)    Severe back pain   Latex     Bandaids only   Nexlizet  [Bempedoic Acid-Ezetimibe ]     Feels spaced out and has joint aches   Other Itching    Seasonal    Pravastatin Sodium     Other reaction(s): Joint Pains   Shellfish Allergy  Swelling    Face, eyes, ears, all swelled. ER gave her Epi pen.     Social History:  reports that she has quit smoking. Her smoking use included cigarettes. She has never used smokeless tobacco. She reports current alcohol use. She reports that she does not use drugs.   Family History: Family History  Adopted: Yes  Problem Relation Age of Onset   Breast cancer Mother 102   Alcohol abuse Mother    Hyperlipidemia Sister    Hyperlipidemia Brother    Hyperlipidemia Brother    Obesity Son    Hypertension Son     Review of Systems: Review of Systems  Constitutional:  Negative for chills and fever.  HENT:  Negative for hearing loss.   Respiratory:  Negative for shortness of breath.   Cardiovascular:  Negative for chest pain.  Gastrointestinal:  Positive for abdominal pain. Negative for nausea and vomiting.  Genitourinary:  Negative for dysuria.  Musculoskeletal:  Negative for myalgias.  Skin:  Negative for rash.  Neurological:  Negative for dizziness.  Psychiatric/Behavioral:  Negative for depression.     Physical Exam BP 138/77   Pulse 91   Temp 98.1 F (36.7 C) (Oral)   Ht 5' 5 (1.651 m)   Wt 177 lb (80.3 kg)   LMP  (LMP Unknown)   SpO2 97%   BMI 29.45 kg/m  CONSTITUTIONAL: No acute distress, well-nourished HEENT:  Normocephalic, atraumatic, extraocular motion intact. NECK: Trachea is midline, and there is no jugular venous distension.  RESPIRATORY:  Lungs are clear, and breath sounds are equal bilaterally. Normal respiratory effort without pathologic use of accessory muscles. CARDIOVASCULAR: Heart is regular without murmurs, gallops, or rubs. GI: The abdomen is soft, nondistended, with mild soreness to palpation in the right upper quadrant.  Negative Murphy's sign.  MUSCULOSKELETAL:  Normal muscle strength and tone in all four extremities.  No peripheral edema or cyanosis. SKIN: Skin turgor is normal. There are no pathologic skin lesions.  NEUROLOGIC:  Motor and  sensation is grossly normal.  Cranial nerves are grossly intact. PSYCH:  Alert and oriented to person, place and time. Affect is normal.  Laboratory Analysis: Labs from 03/05/2024: Sodium 138, potassium 3.9, chloride 97, CO2 28, BUN 17, creatinine 0.65.  Total bilirubin 0.3, AST 23, ALT 43, alkaline phosphatase 70, albumin 4.6.  Hemoglobin A1c 6.4.  Imaging: Ultrasound RUQ on 03/19/2024: FINDINGS: Gallbladder: Distended gallbladder. No wall thickening or adjacent fluid but multiple shadowing stones. No reported sonographic Murphy's sign.   Common bile duct: Diameter: 3 mm   Liver: Diffusely echogenic hepatic parenchyma consistent with fatty liver infiltration. With this level of echogenicity evaluation for underlying mass lesion is limited and if needed follow-up contrast CT or MRI as clinically appropriate. Portal vein is patent on color Doppler imaging with normal direction of blood flow towards the liver.   Other: None.   IMPRESSION: Distended gallbladder with numerous stones. No ductal dilatation or further sonographic evidence of acute cholecystitis.  No ductal dilatation.  Assessment and Plan: This is a 68 y.o. female with fatty liver and cholelithiasis.  - Discussed the patient's findings on her laboratory studies and ultrasound imaging.  Overall she does have changes consistent with fatty liver or hepatic steatosis.  Discussed with her that this can lead to the mild elevation in her ALT.  However her gallbladder issues are not related to this.  Discussed that for her fatty liver, weight loss will be of help in order to decrease the amount of fatty infiltration. - With regards to her gallbladder, after the patient has thought more about it, she feels that she has had milder episodes of biliary colic in the past which she attributed to gas issues after eating.  More recently, she did have some discomfort in the right upper quadrant after eating pizza.  As such, she would rather proceed  with surgery rather than do watchful waiting.  She has a family relative also who needed emergency gallbladder surgery and she would rather avoid this if possible. - As such, discussed with her the plan for robotic assisted cholecystectomy.  Reviewed the surgery at length with her including the planned incisions, risks of bleeding, infection, injury to surrounding structures, the use of ICG to better evaluate the biliary anatomy, that this would be an outpatient procedure, postoperative pain control, activity restrictions, and she is willing to proceed. - We will schedule her for surgery on 05/05/2024.  All of her questions have been answered.  I spent 55 minutes dedicated to the care of this patient on the date of this encounter to include pre-visit review of records, face-to-face time with the patient discussing diagnosis and management, and any post-visit coordination of care.   Aloysius Sheree Plant, MD [facility]

## 2024-04-20 NOTE — H&P (View-Only) (Signed)
 04/20/2024  Reason for Visit:  Cholelithiasis  Requesting Provider:  Leron Glance, NP  History of Present Illness: Debra [name] is a 68 y.o. female presenting for evaluation of newly found cholelithiasis.  The patient visit with her PCP on 03/05/2024 and part of the regular laboratory studies including a CMP which had an elevated ALT of 43.  An ultrasound of the right upper quadrant was obtained on 03/19/2024 which showed hepatic steatosis as well as cholelithiasis.  She was referred to general surgery for further evaluation of her cholelithiasis.  At first the patient thought that she was otherwise asymptomatic but now thinking back with the finding of cholelithiasis and some of the symptoms that she is whereupon that she probably has had some mild episodes of biliary colic in the past with discomfort in the right upper quadrant area after eating.  She had some issues after eating beans that she thought was more related to gas issues but more recently she has also had discomfort after eating pizza.  Denies any nausea or vomiting, fevers or chills.  She does report having diarrhea recently.  Past Medical History: Past Medical History:  Diagnosis Date   Allergies    Borderline diabetes 03/30/2015   GERD (gastroesophageal reflux disease)    Grade I diastolic dysfunction    Hyperlipidemia    Hypertension    Lumbar back pain    Pre-diabetes    during pregnancy      Past Surgical History: Past Surgical History:  Procedure Laterality Date   CARPAL TUNNEL RELEASE Right    CHONDROPLASTY Right 03/01/2023   Procedure: Right medial meniscus root repair, chondroplasty;  Surgeon: Tobie Priest, MD;  Location: [facility];  Service: Orthopedics;  Laterality: Right;   COLONOSCOPY WITH PROPOFOL  N/A 12/30/2020   Procedure: COLONOSCOPY WITH PROPOFOL ;  Surgeon: Jinny Carmine, MD;  Location: [facility];  Service: Endoscopy;  Laterality: N/A;   KNEE ARTHROSCOPY WITH MENISCAL REPAIR Right  03/01/2023   Procedure: Right medial meniscus root repair, chondroplasty;  Surgeon: Tobie Priest, MD;  Location: [facility];  Service: Orthopedics;  Laterality: Right;   LUMBAR LAMINECTOMY/DECOMPRESSION MICRODISCECTOMY Left 10/16/2017   Procedure: Microlumbar decompression L4-5, L5-S1 left;  Surgeon: Duwayne Purchase, MD;  Location: [facility];  Service: Orthopedics;  Laterality: Left;  120 mins   PLANTAR FASCIA RELEASE Left    POLYPECTOMY  12/30/2020   Procedure: POLYPECTOMY;  Surgeon: Jinny Carmine, MD;  Location: [facility];  Service: Endoscopy;;   RHINOPLASTY  1988   SHOULDER SURGERY Right 2011   TEMPOROMANDIBULAR JOINT SURGERY  1990   TOTAL ABDOMINAL HYSTERECTOMY  1998    Home Medications: Prior to Admission medications   Medication Sig Start Date End Date Taking? Authorizing Provider  Biotin 1000 MCG CHEW Chew 1 each by mouth daily.    [provider]  EPINEPHrine  0.3 mg/0.3 mL IJ SOAJ injection Inject 0.3 mg into the muscle as needed for anaphylaxis. 07/15/23   Bernardo Fend, DO  Evolocumab  (REPATHA  SURECLICK) 140 MG/ML SOAJ Inject 140 mg into the skin every 14 (fourteen) days. 04/08/24   Glance Leron, NP  hydrochlorothiazide  (HYDRODIURIL ) 25 MG tablet Take 1 tablet (25 mg total) by mouth daily. 04/08/24   Glance Leron, NP  losartan  (COZAAR ) 50 MG tablet Take 1 tablet (50 mg total) by mouth daily. 04/08/24   Glance Leron, NP  Melatonin 10 MG TABS Take 1 tablet by mouth at bedtime.     [provider]  metFORMIN  (GLUCOPHAGE ) 1000 MG tablet Take  1 tablet (1,000 mg total) by mouth 2 (two) times daily with a meal. 04/08/24   Gretel App, NP    Allergies: Allergies  Allergen Reactions   Fenofibrate  Other (See Comments)    Severe back pain   Latex     Bandaids only   Nexlizet  [Bempedoic Acid-Ezetimibe ]     Feels spaced out and has joint aches   Other Itching    Seasonal    Pravastatin Sodium     Other reaction(s): Joint Pains   Shellfish Allergy  Swelling    Face, eyes, ears, all swelled. ER gave her Epi pen.     Social History:  reports that she has quit smoking. Her smoking use included cigarettes. She has never used smokeless tobacco. She reports current alcohol use. She reports that she does not use drugs.   Family History: Family History  Adopted: Yes  Problem Relation Age of Onset   Breast cancer Mother 102   Alcohol abuse Mother    Hyperlipidemia Sister    Hyperlipidemia Brother    Hyperlipidemia Brother    Obesity Son    Hypertension Son     Review of Systems: Review of Systems  Constitutional:  Negative for chills and fever.  HENT:  Negative for hearing loss.   Respiratory:  Negative for shortness of breath.   Cardiovascular:  Negative for chest pain.  Gastrointestinal:  Positive for abdominal pain. Negative for nausea and vomiting.  Genitourinary:  Negative for dysuria.  Musculoskeletal:  Negative for myalgias.  Skin:  Negative for rash.  Neurological:  Negative for dizziness.  Psychiatric/Behavioral:  Negative for depression.     Physical Exam BP 138/77   Pulse 91   Temp 98.1 F (36.7 C) (Oral)   Ht 5' 5 (1.651 m)   Wt 177 lb (80.3 kg)   LMP  (LMP Unknown)   SpO2 97%   BMI 29.45 kg/m  CONSTITUTIONAL: No acute distress, well-nourished HEENT:  Normocephalic, atraumatic, extraocular motion intact. NECK: Trachea is midline, and there is no jugular venous distension.  RESPIRATORY:  Lungs are clear, and breath sounds are equal bilaterally. Normal respiratory effort without pathologic use of accessory muscles. CARDIOVASCULAR: Heart is regular without murmurs, gallops, or rubs. GI: The abdomen is soft, nondistended, with mild soreness to palpation in the right upper quadrant.  Negative Murphy's sign.  MUSCULOSKELETAL:  Normal muscle strength and tone in all four extremities.  No peripheral edema or cyanosis. SKIN: Skin turgor is normal. There are no pathologic skin lesions.  NEUROLOGIC:  Motor and  sensation is grossly normal.  Cranial nerves are grossly intact. PSYCH:  Alert and oriented to person, place and time. Affect is normal.  Laboratory Analysis: Labs from 03/05/2024: Sodium 138, potassium 3.9, chloride 97, CO2 28, BUN 17, creatinine 0.65.  Total bilirubin 0.3, AST 23, ALT 43, alkaline phosphatase 70, albumin 4.6.  Hemoglobin A1c 6.4.  Imaging: Ultrasound RUQ on 03/19/2024: FINDINGS: Gallbladder: Distended gallbladder. No wall thickening or adjacent fluid but multiple shadowing stones. No reported sonographic Murphy's sign.   Common bile duct: Diameter: 3 mm   Liver: Diffusely echogenic hepatic parenchyma consistent with fatty liver infiltration. With this level of echogenicity evaluation for underlying mass lesion is limited and if needed follow-up contrast CT or MRI as clinically appropriate. Portal vein is patent on color Doppler imaging with normal direction of blood flow towards the liver.   Other: None.   IMPRESSION: Distended gallbladder with numerous stones. No ductal dilatation or further sonographic evidence of acute cholecystitis.  No ductal dilatation.  Assessment and Plan: This is a 68 y.o. female with fatty liver and cholelithiasis.  - Discussed the patient's findings on her laboratory studies and ultrasound imaging.  Overall she does have changes consistent with fatty liver or hepatic steatosis.  Discussed with her that this can lead to the mild elevation in her ALT.  However her gallbladder issues are not related to this.  Discussed that for her fatty liver, weight loss will be of help in order to decrease the amount of fatty infiltration. - With regards to her gallbladder, after the patient has thought more about it, she feels that she has had milder episodes of biliary colic in the past which she attributed to gas issues after eating.  More recently, she did have some discomfort in the right upper quadrant after eating pizza.  As such, she would rather proceed  with surgery rather than do watchful waiting.  She has a family relative also who needed emergency gallbladder surgery and she would rather avoid this if possible. - As such, discussed with her the plan for robotic assisted cholecystectomy.  Reviewed the surgery at length with her including the planned incisions, risks of bleeding, infection, injury to surrounding structures, the use of ICG to better evaluate the biliary anatomy, that this would be an outpatient procedure, postoperative pain control, activity restrictions, and she is willing to proceed. - We will schedule her for surgery on 05/05/2024.  All of her questions have been answered.  I spent 55 minutes dedicated to the care of this patient on the date of this encounter to include pre-visit review of records, face-to-face time with the patient discussing diagnosis and management, and any post-visit coordination of care.   Aloysius Sheree Plant, MD [facility]

## 2024-04-21 ENCOUNTER — Telehealth: Payer: Self-pay | Admitting: Surgery

## 2024-04-21 NOTE — Telephone Encounter (Signed)
 Patient has been advised of Pre-Admission date/time, and Surgery date at [facility].  Surgery Date: 05/05/24 Preadmission Testing Date: 04/28/24 (phone 8a-1p)  Patient informed of the scheduling process and surgery information given at time of office visit.  Patient has been made aware to call [phone], between 1-3:00pm the day before surgery, to find out what time to arrive for surgery.

## 2024-04-23 DIAGNOSIS — K080 Exfoliation of teeth due to systemic causes: Secondary | ICD-10-CM | POA: Diagnosis not present

## 2024-04-28 ENCOUNTER — Encounter
Admission: RE | Admit: 2024-04-28 | Discharge: 2024-04-28 | Disposition: A | Discharge status: Home / Self Care | Source: Ambulatory Visit | Attending: Surgery | Admitting: Surgery

## 2024-04-28 ENCOUNTER — Other Ambulatory Visit: Payer: Self-pay

## 2024-04-28 HISTORY — DX: Type 2 diabetes mellitus without complications: E11.9

## 2024-04-28 HISTORY — DX: Polyneuropathy, unspecified: G62.9

## 2024-04-28 HISTORY — DX: Unspecified asthma, uncomplicated: J45.909

## 2024-04-28 NOTE — Patient Instructions (Addendum)
 Your procedure is scheduled on: Tuesday 05/05/24 Report to the Registration Desk on the 1st floor of the [facility]. To find out your arrival time, please call [phone] between 1PM - 3PM on: Monday 05/04/24 If your arrival time is 6:00 am, do not arrive before that time as the [facility] entrance doors do not open until 6:00 am.  REMEMBER: Instructions that are not followed completely may result in serious medical risk, up to and including death; or upon the discretion of your surgeon and anesthesiologist your surgery may need to be rescheduled.  Do not eat food after midnight the night before surgery.  No gum chewing or hard candies.  You may however, drink CLEAR liquids up to 2 hours before you are scheduled to arrive for your surgery. Do not drink anything within 2 hours of your scheduled arrival time.  Clear liquids include: - water    **Type 1 and Type 2 diabetics should only drink water .**  One week prior to surgery: Stop Anti-inflammatories (NSAIDS) such as Advil , Aleve, Ibuprofen , Motrin , Naproxen, Naprosyn and Aspirin  based products such as Excedrin, Goody's Powder, BC Powder.  You may however, continue to take Tylenol  if needed for pain up until the day of surgery.  Stop ALL OVER THE COUNTER supplements and vitamins until after surgery.  **Follow guidelines for insulin and diabetes medications.** Hold Metformin  for surgery, last dose of will be Saturday 05/02/24  Continue taking all of your other prescription medications up until the day of surgery.  ON THE DAY OF SURGERY ONLY TAKE THESE MEDICATIONS WITH SIPS OF WATER :  omeprazole  (PRILOSEC) 20 MG capsule   No Alcohol for 24 hours before or after surgery.  No Smoking including e-cigarettes for 24 hours before surgery.  No chewable tobacco products for at least 6 hours before surgery.  No nicotine patches on the day of surgery.  Do not use any recreational drugs for at least a week (preferably 2 weeks) before  your surgery.  Please be advised that the combination of cocaine and anesthesia may have negative outcomes, up to and including death. If you test positive for cocaine, your surgery will be cancelled.  On the morning of surgery brush your teeth with toothpaste and water , you may rinse your mouth with mouthwash if you wish. Do not swallow any toothpaste or mouthwash.  Use CHG Soap or wipes as directed on instruction sheet.  Do not wear lotions, powders, perfumes or deodorant.  Do not shave body hair from the neck down 48 hours before surgery.  Wear comfortable clothing (specific to your surgery type) to the hospital.  Do not wear jewelry, make-up, hairpins, clips or nail polish.  For welded (permanent) jewelry: bracelets, anklets, waist bands, etc.  Please have this removed prior to surgery.  If it is not removed, there is a chance that hospital personnel will need to cut it off on the day of surgery.  Contact lenses, hearing aids and dentures may not be worn into surgery.  Do not bring valuables to the hospital. [facility] is not responsible for any missing/lost belongings or valuables.   Notify your doctor if there is any change in your medical condition (cold, fever, infection).  After surgery, you can help prevent lung complications by doing breathing exercises.  Take deep breaths and cough every 1-2 hours. Your doctor may order a device called an Incentive Spirometer to help you take deep breaths. When coughing or sneezing, hold a pillow firmly against your incision with both hands. This  is called "splinting." Doing this helps protect your incision. It also decreases belly discomfort.  If you are being discharged the day of surgery, you will not be allowed to drive home. You will need a responsible individual to drive you home and stay with you for 24 hours after surgery.   Please call the Pre-admissions Testing Dept. at [phone] if you have any questions about these  instructions.  Surgery Visitation Policy:  Patients having surgery or a procedure may have two visitors.  Children under the age of 50 must have an adult with them who is not the patient.   Merchandiser, retail to address health-related social needs:  [email or web address]     Preparing for Surgery with CHLORHEXIDINE  GLUCONATE (CHG) Soap  Chlorhexidine  Gluconate (CHG) Soap  o An antiseptic cleaner that kills germs and bonds with the skin to continue killing germs even after washing  o Used for showering the night before surgery and morning of surgery  Before surgery, you can play an important role by reducing the number of germs on your skin.  CHG (Chlorhexidine  gluconate) soap is an antiseptic cleanser which kills germs and bonds with the skin to continue killing germs even after washing.  Please do not use if you have an allergy to CHG or antibacterial soaps. If your skin becomes reddened/irritated stop using the CHG.  1. Shower the NIGHT BEFORE SURGERY and the MORNING OF SURGERY with CHG soap.  2. If you choose to wash your hair, wash your hair first as usual with your normal shampoo.  3. After shampooing, rinse your hair and body thoroughly to remove the shampoo.  4. Use CHG as you would any other liquid soap. You can apply CHG directly to the skin and wash gently with a scrungie or a clean washcloth.  5. Apply the CHG soap to your body only from the neck down. Do not use on open wounds or open sores. Avoid contact with your eyes, ears, mouth, and genitals (private parts). Wash face and genitals (private parts) with your normal soap.  6. Wash thoroughly, paying special attention to the area where your surgery will be performed.  7. Thoroughly rinse your body with warm water .  8. Do not shower/wash with your normal soap after using and rinsing off the CHG soap.  9. Pat yourself dry with a clean towel.  10. Wear clean pajamas to bed the night before  surgery.  12. Place clean sheets on your bed the night of your first shower and do not sleep with pets.  13. Shower again with the CHG soap on the day of surgery prior to arriving at the hospital.  14. Do not apply any deodorants/lotions/powders.  15. Please wear clean clothes to the hospital.  Sequential Compression Device A sequential compression device (SCD) uses inflatable boots, cuffs, or sleeves that you wear around your arm, leg, or foot. The boot, cuff, or sleeve inflates at certain time intervals so that air-filled chambers squeeze your limbs or feet. This helps improve blood flow by working like the pumping action normally done by the muscles. You may need this device if: Your arms, legs, or feet have poor circulation or swelling due to a medical condition, such as chronic venous insufficiency, venous stasis, or lymphedema. You are at risk of a blood clot forming in a vein after surgery or if you are on bed rest. Blood clots (deep vein thrombosis, or DVT) can travel to your lungs and cause life-threatening problems. SCDs help prevent  blood clots by preventing blood from pooling in your limbs or feet. Your health care provider will decide when you should use the device, how often it will inflate, and how much pressure it will apply. Tell a health care provider about: Any allergies you have, including any skin allergies to materials such as latex or certain medical tapes. All medicines you are taking, including vitamins, herbs, creams, and over-the-counter medicines. Any bleeding problems you have. Any surgeries you have had. Any medical conditions you have, including any previous vascular-related conditions such as peripheral vascular disease. What are the benefits? The main benefit of using this device is improved circulation. Using the device may: Reduce swelling (edema). Improve healing if you have leg or foot ulcers from poor circulation. Lower your risk of blood clots. Help  with discomfort, stiffness, or soreness in your legs or feet, especially after surgery. Help you begin walking and recovering sooner after surgery or bed rest. What are the risks? Generally, this is a safe treatment. However, problems may occur, including: Irritated or sore skin. Mild discomfort or warmth and sweating beneath the cuff. An injury to the nerve that allows movement and sensation in your lower legs, feet, and toes (peroneal nerve). If the device puts too much pressure on your legs, this nerve may be temporarily affected. If this happens, your ankle may feel numb or weak. Compartment syndrome. This is a condition in which too much pressure on your leg makes it hard for blood to reach the muscles of those extremities. This can result in injury to your muscles. The risk of developing this condition is low. What happens before the treatment? A health care provider may measure your limb to make sure you get the right size cuff. The boot, cuff, or sleeve can be put on over or under your clothes, whichever is more comfortable. If you will wear the cuff or sleeve over clothing, you should wear loose-fitting clothing that is free of zippers, snaps, or buttons. These fasteners may cause discomfort or skin irritation. You will lie down on a bed or padded table. SCDs are often used in the hospital setting, especially after surgery, but many times SCDs can now be used at home. Your health care provider will place an inflatable boot, cuff, or sleeve around your arm, leg, or foot. This cuff is attached to a machine that is programmed to pump air and inflate the cuff according to rules set by the health care provider. What happens during the treatment?  You will lie still with the boot, cuff, or sleeve around your arm, leg, or foot. The boot, cuff, or sleeve will inflate around your arm, leg, or foot at a programmed rate. The machine pumps air in and out of the boot, cuff, or sleeve. The pump will  inflate different sections of the boot, cuff, or sleeve to help blood flow through the veins. The treatment may feel like a massage. Tell your health care provider if you feel pain, get too hot, or feel uncomfortable. Ask for help if you need to get up. Do not walk with the compression pumps, cuffs, or sleeves in place due to the risk of falling. The overall treatment time varies, but it may be done multiple times during the day. Your health care provider will determine the best schedule for your needs. What can I expect after the treatment? Return to your normal activities as told by your health care provider. Ask your health care provider what activities are safe for  you. Keep all follow-up visits. This is important. Follow these instructions at home: If you will be using the device at home, you will also be instructed on how to correctly put on the cuff. Be sure to ask any questions you have about the device or why it is being used. Contact a health care provider if: You develop any pain or numbness in your leg or foot. You develop any redness or your skin breaks down where the device touches your skin. This may include scrapes, sores, or cuts in the skin. Your device stops working. Get help right away if: Your arm, leg, or foot becomes warm, red, or swollen. You have severe pain in your leg or foot. You have chest pain. You have trouble breathing. These symptoms may represent a serious problem that is an emergency. Do not wait to see if the symptoms will go away. Get medical help right away. Call your local emergency services (911 in the [place] Do not drive yourself to the hospital. Summary A sequential compression device is used to improve the blood flow in your arm, leg, or foot. The boot, cuff, or sleeve inflates at certain time intervals so that air-filled chambers squeeze your limbs or feet. This helps improve blood flow by working like the pumping action normally done by the  muscles. This device can help improve circulation, lower the risk of blood clots after surgery, reduce swelling, or improve healing of certain skin wounds and ulcers. Contact a health care provider if you develop any skin breakdown, pain, or numbness in your leg or foot. This information is not intended to replace advice given to you by your health care provider. Make sure you discuss any questions you have with your health care provider. Document Revised: 03/16/2021 Document Reviewed: 03/16/2021 Elsevier Patient Education  2024 [employer].

## 2024-04-29 ENCOUNTER — Encounter: Payer: Self-pay | Admitting: Urgent Care

## 2024-04-29 ENCOUNTER — Encounter
Admission: RE | Admit: 2024-04-29 | Discharge: 2024-04-29 | Disposition: A | Discharge status: Home / Self Care | Source: Ambulatory Visit | Attending: Surgery | Admitting: Surgery

## 2024-04-29 ENCOUNTER — Other Ambulatory Visit

## 2024-04-29 DIAGNOSIS — E119 Type 2 diabetes mellitus without complications: Secondary | ICD-10-CM | POA: Insufficient documentation

## 2024-04-29 DIAGNOSIS — I10 Essential (primary) hypertension: Secondary | ICD-10-CM | POA: Diagnosis not present

## 2024-04-29 DIAGNOSIS — Z01818 Encounter for other preprocedural examination: Secondary | ICD-10-CM | POA: Diagnosis not present

## 2024-04-29 DIAGNOSIS — Z01810 Encounter for preprocedural cardiovascular examination: Secondary | ICD-10-CM | POA: Diagnosis not present

## 2024-04-29 HISTORY — DX: Insomnia, unspecified: G47.00

## 2024-04-29 HISTORY — DX: Type 2 diabetes mellitus without complications: E11.9

## 2024-04-29 LAB — BASIC METABOLIC PANEL WITH GFR
Anion gap: 16 — ABNORMAL HIGH (ref 5–15)
BUN: 18 mg/dL (ref 8–23)
CO2: 23 mmol/L (ref 22–32)
Calcium: 9.8 mg/dL (ref 8.9–10.3)
Chloride: 98 mmol/L (ref 98–111)
Creatinine, Ser: 0.44 mg/dL (ref 0.44–1.00)
GFR, Estimated: >60 mL/min (ref >60)
Glucose, Bld: 116 mg/dL — ABNORMAL HIGH (ref 70–99)
Potassium: 3.7 mmol/L (ref 3.5–5.1)
Sodium: 137 mmol/L (ref 135–145)

## 2024-04-29 LAB — CBC
HCT: 41.1 % (ref 36.0–46.0)
Hemoglobin: 13.9 g/dL (ref 12.0–15.0)
MCH: 30.5 pg (ref 26.0–34.0)
MCHC: 33.8 g/dL (ref 30.0–36.0)
MCV: 90.1 fL (ref 80.0–100.0)
Platelets: 351 K/uL (ref 150–400)
RBC: 4.56 MIL/uL (ref 3.87–5.11)
RDW: 12.6 % (ref 11.5–15.5)
WBC: 8 K/uL (ref 4.0–10.5)
nRBC: 0 % (ref 0.0–0.2)

## 2024-05-05 ENCOUNTER — Ambulatory Visit: Admitting: Anesthesiology

## 2024-05-05 ENCOUNTER — Ambulatory Visit
Admission: RE | Admit: 2024-05-05 | Discharge: 2024-05-05 | Disposition: A | Discharge status: Home / Self Care | Source: Ambulatory Visit | Attending: Surgery | Admitting: Surgery

## 2024-05-05 ENCOUNTER — Encounter: Payer: Self-pay | Admitting: Surgery

## 2024-05-05 ENCOUNTER — Ambulatory Visit: Payer: Self-pay | Admitting: Urgent Care

## 2024-05-05 ENCOUNTER — Other Ambulatory Visit: Payer: Self-pay

## 2024-05-05 ENCOUNTER — Encounter
Admission: RE | Disposition: A | Discharge status: Home / Self Care | Payer: Self-pay | Source: Ambulatory Visit | Attending: Surgery

## 2024-05-05 DIAGNOSIS — K219 Gastro-esophageal reflux disease without esophagitis: Secondary | ICD-10-CM | POA: Insufficient documentation

## 2024-05-05 DIAGNOSIS — Z87891 Personal history of nicotine dependence: Secondary | ICD-10-CM | POA: Diagnosis not present

## 2024-05-05 DIAGNOSIS — E119 Type 2 diabetes mellitus without complications: Secondary | ICD-10-CM

## 2024-05-05 DIAGNOSIS — Z8249 Family history of ischemic heart disease and other diseases of the circulatory system: Secondary | ICD-10-CM | POA: Diagnosis not present

## 2024-05-05 DIAGNOSIS — K760 Fatty (change of) liver, not elsewhere classified: Secondary | ICD-10-CM | POA: Diagnosis not present

## 2024-05-05 DIAGNOSIS — I10 Essential (primary) hypertension: Secondary | ICD-10-CM | POA: Diagnosis not present

## 2024-05-05 DIAGNOSIS — K8010 Calculus of gallbladder with chronic cholecystitis without obstruction: Secondary | ICD-10-CM | POA: Diagnosis not present

## 2024-05-05 DIAGNOSIS — K8020 Calculus of gallbladder without cholecystitis without obstruction: Secondary | ICD-10-CM

## 2024-05-05 HISTORY — PX: INDOCYANINE GREEN FLUORESCENCE IMAGING (ICG): SHX7595

## 2024-05-05 LAB — GLUCOSE, CAPILLARY
Glucose-Capillary: 122 mg/dL — ABNORMAL HIGH (ref 70–99)
Glucose-Capillary: 156 mg/dL — ABNORMAL HIGH (ref 70–99)

## 2024-05-05 SURGERY — CHOLECYSTECTOMY, ROBOT-ASSISTED, LAPAROSCOPIC
Anesthesia: General

## 2024-05-05 MED ORDER — CHLORHEXIDINE GLUCONATE 0.12 % MT SOLN
15.0000 mL | Freq: Once | OROMUCOSAL | Status: AC
  Administered 2024-05-05: 15 mL via OROMUCOSAL

## 2024-05-05 MED ORDER — MIDAZOLAM HCL 2 MG/2ML IJ SOLN
INTRAMUSCULAR | Status: AC
  Filled 2024-05-05: qty 2

## 2024-05-05 MED ORDER — MIDAZOLAM HCL 2 MG/2ML IJ SOLN
INTRAMUSCULAR | Status: DC | PRN
  Administered 2024-05-05: 2 mg via INTRAVENOUS

## 2024-05-05 MED ORDER — CELECOXIB 200 MG PO CAPS: 200.0000 mg | ORAL_CAPSULE | Freq: Once | ORAL | Status: DC

## 2024-05-05 MED ORDER — ACETAMINOPHEN 500 MG PO TABS
1000.0000 mg | ORAL_TABLET | ORAL | Status: AC
  Administered 2024-05-05: 1000 mg via ORAL

## 2024-05-05 MED ORDER — CHLORHEXIDINE GLUCONATE 0.12 % MT SOLN
OROMUCOSAL | Status: AC
  Filled 2024-05-05: qty 15

## 2024-05-05 MED ORDER — LIDOCAINE HCL (CARDIAC) PF 100 MG/5ML IV SOSY
PREFILLED_SYRINGE | INTRAVENOUS | Status: DC | PRN
  Administered 2024-05-05: 100 mg via INTRAVENOUS

## 2024-05-05 MED ORDER — ROCURONIUM BROMIDE 10 MG/ML (PF) SYRINGE
PREFILLED_SYRINGE | INTRAVENOUS | Status: AC
  Filled 2024-05-05: qty 10

## 2024-05-05 MED ORDER — CHLORHEXIDINE GLUCONATE CLOTH 2 % EX PADS: 6.0000 | MEDICATED_PAD | Freq: Once | CUTANEOUS | Status: DC

## 2024-05-05 MED ORDER — PROPOFOL 10 MG/ML IV BOLUS
INTRAVENOUS | Status: DC | PRN
  Administered 2024-05-05: 135 ug/kg/min via INTRAVENOUS
  Administered 2024-05-05: 150 ug/kg/min via INTRAVENOUS
  Administered 2024-05-05: 50 mg via INTRAVENOUS
  Administered 2024-05-05: 125 ug/kg/min via INTRAVENOUS

## 2024-05-05 MED ORDER — LIDOCAINE HCL (PF) 2 % IJ SOLN
INTRAMUSCULAR | Status: AC
  Filled 2024-05-05: qty 5

## 2024-05-05 MED ORDER — DROPERIDOL 2.5 MG/ML IJ SOLN
INTRAMUSCULAR | Status: AC
  Filled 2024-05-05: qty 2

## 2024-05-05 MED ORDER — FENTANYL CITRATE (PF) 100 MCG/2ML IJ SOLN
25.0000 ug | INTRAMUSCULAR | Status: DC | PRN
  Administered 2024-05-05 (×2): 50 ug via INTRAVENOUS

## 2024-05-05 MED ORDER — ACETAMINOPHEN 500 MG PO TABS: 1000.0000 mg | ORAL_TABLET | Freq: Four times a day (QID) | ORAL | Status: DC | PRN

## 2024-05-05 MED ORDER — PROPOFOL 1000 MG/100ML IV EMUL
INTRAVENOUS | Status: AC
  Filled 2024-05-05: qty 100

## 2024-05-05 MED ORDER — PHENYLEPHRINE 80 MCG/ML (10ML) SYRINGE FOR IV PUSH (FOR BLOOD PRESSURE SUPPORT)
PREFILLED_SYRINGE | INTRAVENOUS | Status: DC | PRN
  Administered 2024-05-05 (×3): 80 ug via INTRAVENOUS

## 2024-05-05 MED ORDER — CELECOXIB 200 MG PO CAPS
ORAL_CAPSULE | ORAL | Status: AC
  Filled 2024-05-05: qty 1

## 2024-05-05 MED ORDER — SODIUM CHLORIDE 0.9 % IV SOLN: INTRAVENOUS | Status: DC

## 2024-05-05 MED ORDER — BUPIVACAINE LIPOSOME 1.3 % IJ SUSP: 10.0000 mL | Freq: Once | INTRAMUSCULAR | Status: DC

## 2024-05-05 MED ORDER — OXYCODONE HCL 5 MG/5ML PO SOLN: 5.0000 mg | Freq: Once | ORAL | Status: AC | PRN

## 2024-05-05 MED ORDER — DEXAMETHASONE SODIUM PHOSPHATE 10 MG/ML IJ SOLN
INTRAMUSCULAR | Status: AC
  Filled 2024-05-05: qty 1

## 2024-05-05 MED ORDER — FENTANYL CITRATE (PF) 100 MCG/2ML IJ SOLN
INTRAMUSCULAR | Status: AC
Start: 2024-05-05 — End: 2024-05-05
  Filled 2024-05-05: qty 2

## 2024-05-05 MED ORDER — DEXAMETHASONE SODIUM PHOSPHATE 10 MG/ML IJ SOLN
INTRAMUSCULAR | Status: DC | PRN
  Administered 2024-05-05: 10 mg via INTRAVENOUS

## 2024-05-05 MED ORDER — GABAPENTIN 300 MG PO CAPS
300.0000 mg | ORAL_CAPSULE | ORAL | Status: AC
  Administered 2024-05-05: 300 mg via ORAL

## 2024-05-05 MED ORDER — CEFAZOLIN SODIUM-DEXTROSE 2-4 GM/100ML-% IV SOLN
INTRAVENOUS | Status: AC
  Filled 2024-05-05: qty 100

## 2024-05-05 MED ORDER — OXYCODONE HCL 5 MG PO TABS
ORAL_TABLET | ORAL | Status: AC
Start: 2024-05-05 — End: 2024-05-05
  Filled 2024-05-05: qty 1

## 2024-05-05 MED ORDER — ONDANSETRON HCL 4 MG/2ML IJ SOLN
INTRAMUSCULAR | Status: AC
  Filled 2024-05-05: qty 2

## 2024-05-05 MED ORDER — BUPIVACAINE LIPOSOME 1.3 % IJ SUSP
INTRAMUSCULAR | Status: AC
  Filled 2024-05-05: qty 10

## 2024-05-05 MED ORDER — OXYCODONE HCL 5 MG PO TABS
5.0000 mg | ORAL_TABLET | Freq: Once | ORAL | Status: AC | PRN
  Administered 2024-05-05: 5 mg via ORAL

## 2024-05-05 MED ORDER — LACTATED RINGERS IV SOLN: INTRAVENOUS | Status: DC | PRN

## 2024-05-05 MED ORDER — EPHEDRINE SULFATE-NACL 50-0.9 MG/10ML-% IV SOSY
PREFILLED_SYRINGE | INTRAVENOUS | Status: DC | PRN
  Administered 2024-05-05: 200 mg via INTRAVENOUS

## 2024-05-05 MED ORDER — CELECOXIB 200 MG PO CAPS
200.0000 mg | ORAL_CAPSULE | Freq: Once | ORAL | Status: AC
  Administered 2024-05-05: 200 mg via ORAL

## 2024-05-05 MED ORDER — 0.9 % SODIUM CHLORIDE (POUR BTL) OPTIME
TOPICAL | Status: DC | PRN
  Administered 2024-05-05: 500 mL

## 2024-05-05 MED ORDER — IBUPROFEN 600 MG PO TABS: 600.0000 mg | ORAL_TABLET | Freq: Three times a day (TID) | ORAL | 1 refills | Status: DC | PRN

## 2024-05-05 MED ORDER — GABAPENTIN 300 MG PO CAPS
ORAL_CAPSULE | ORAL | Status: AC
  Filled 2024-05-05: qty 1

## 2024-05-05 MED ORDER — ORAL CARE MOUTH RINSE: 15.0000 mL | Freq: Once | OROMUCOSAL | Status: AC

## 2024-05-05 MED ORDER — SODIUM CHLORIDE 0.9 % IR SOLN
Status: DC | PRN
  Administered 2024-05-05: 1000 mL

## 2024-05-05 MED ORDER — INDOCYANINE GREEN 25 MG IV SOLR
1.2500 mg | INTRAVENOUS | Status: AC
  Administered 2024-05-05: 1.25 mg via INTRAVENOUS

## 2024-05-05 MED ORDER — ACETAMINOPHEN 500 MG PO TABS
ORAL_TABLET | ORAL | Status: AC
Start: 2024-05-05 — End: 2024-05-05
  Filled 2024-05-05: qty 2

## 2024-05-05 MED ORDER — ACETAMINOPHEN 10 MG/ML IV SOLN: 1000.0000 mg | Freq: Once | INTRAVENOUS | Status: DC | PRN

## 2024-05-05 MED ORDER — ONDANSETRON HCL 4 MG/2ML IJ SOLN
INTRAMUSCULAR | Status: DC | PRN
  Administered 2024-05-05: 4 mg via INTRAVENOUS

## 2024-05-05 MED ORDER — FENTANYL CITRATE (PF) 100 MCG/2ML IJ SOLN
INTRAMUSCULAR | Status: DC | PRN
  Administered 2024-05-05 (×2): 50 ug via INTRAVENOUS

## 2024-05-05 MED ORDER — DROPERIDOL 2.5 MG/ML IJ SOLN
0.6250 mg | Freq: Once | INTRAMUSCULAR | Status: AC | PRN
  Administered 2024-05-05: 0.625 mg via INTRAVENOUS

## 2024-05-05 MED ORDER — BUPIVACAINE-EPINEPHRINE (PF) 0.25% -1:200000 IJ SOLN
INTRAMUSCULAR | Status: AC
  Filled 2024-05-05: qty 30

## 2024-05-05 MED ORDER — OXYCODONE HCL 5 MG PO TABS: 5.0000 mg | ORAL_TABLET | ORAL | 0 refills | Status: DC | PRN

## 2024-05-05 MED ORDER — PHENYLEPHRINE 80 MCG/ML (10ML) SYRINGE FOR IV PUSH (FOR BLOOD PRESSURE SUPPORT)
PREFILLED_SYRINGE | INTRAVENOUS | Status: AC
  Filled 2024-05-05: qty 10

## 2024-05-05 MED ORDER — CEFAZOLIN SODIUM-DEXTROSE 2-4 GM/100ML-% IV SOLN
2.0000 g | INTRAVENOUS | Status: AC
  Administered 2024-05-05: 2 g via INTRAVENOUS

## 2024-05-05 MED ORDER — SUGAMMADEX SODIUM 200 MG/2ML IV SOLN
INTRAVENOUS | Status: DC | PRN
  Administered 2024-05-05: 75 mg via INTRAVENOUS

## 2024-05-05 MED ORDER — BUPIVACAINE-EPINEPHRINE (PF) 0.25% -1:200000 IJ SOLN
INTRAMUSCULAR | Status: DC | PRN
  Administered 2024-05-05: 40 mL

## 2024-05-05 MED ORDER — ROCURONIUM BROMIDE 100 MG/10ML IV SOLN
INTRAVENOUS | Status: DC | PRN
  Administered 2024-05-05 (×2): 50 mg via INTRAVENOUS

## 2024-05-05 SURGICAL SUPPLY — 35 items
BAG PRESSURE INF REUSE 1000 (BAG) IMPLANT
CANNULA CAP OBTURATR AIRSEAL 8 (CAP) IMPLANT
CAUTERY HOOK MNPLR 1.6 DVNC XI (INSTRUMENTS) ×2 IMPLANT
CLIP LIGATING HEMO O LOK GREEN (MISCELLANEOUS) ×2 IMPLANT
DEFOGGER SCOPE WARM SEASHARP (MISCELLANEOUS) ×2 IMPLANT
DERMABOND ADVANCED .7 DNX12 (GAUZE/BANDAGES/DRESSINGS) ×2 IMPLANT
DRAPE ARM DVNC X/XI (DISPOSABLE) ×8 IMPLANT
DRAPE COLUMN DVNC XI (DISPOSABLE) ×2 IMPLANT
ELECTRODE CAUTERY BLDE TIP 2.5 (TIP) ×2 IMPLANT
ELECTRODE REM PT RTRN 9FT ADLT (ELECTROSURGICAL) ×2 IMPLANT
FORCEPS BPLR R/ABLATION 8 DVNC (INSTRUMENTS) ×2 IMPLANT
FORCEPS PROGRASP DVNC XI (FORCEP) ×2 IMPLANT
GLOVE SURG SYN 7.0 PF PI (GLOVE) ×4 IMPLANT
GLOVE SURG SYN 7.5 PF PI (GLOVE) ×4 IMPLANT
GOWN STRL REUS W/ TWL LRG LVL3 (GOWN DISPOSABLE) ×8 IMPLANT
IRRIGATOR SUCT 8 DISP DVNC XI (IRRIGATION / IRRIGATOR) IMPLANT
IV NS 1000ML BAXH (IV SOLUTION) IMPLANT
KIT PINK PAD W/HEAD ARM REST (MISCELLANEOUS) ×2 IMPLANT
LABEL OR SOLS (LABEL) ×2 IMPLANT
MANIFOLD NEPTUNE II (INSTRUMENTS) ×2 IMPLANT
NDL HYPO 22X1.5 SAFETY MO (MISCELLANEOUS) ×2 IMPLANT
NEEDLE HYPO 22X1.5 SAFETY MO (MISCELLANEOUS) ×2 IMPLANT
NS IRRIG 500ML POUR BTL (IV SOLUTION) ×2 IMPLANT
OBTURATOR OPTICALSTD 8 DVNC (TROCAR) ×2 IMPLANT
PACK LAP CHOLECYSTECTOMY (MISCELLANEOUS) ×2 IMPLANT
SEAL UNIV 5-12 XI (MISCELLANEOUS) ×8 IMPLANT
SET TUBE FILTERED XL AIRSEAL (SET/KITS/TRAYS/PACK) IMPLANT
SET TUBE SMOKE EVAC HIGH FLOW (TUBING) ×2 IMPLANT
SOLUTION ELECTROSURG ANTI STCK (MISCELLANEOUS) ×2 IMPLANT
SPIKE FLUID TRANSFER (MISCELLANEOUS) ×2 IMPLANT
SUT MNCRL AB 4-0 PS2 18 (SUTURE) ×2 IMPLANT
SUT VIC AB 3-0 SH 27X BRD (SUTURE) IMPLANT
SUT VICRYL 0 UR6 27IN ABS (SUTURE) ×4 IMPLANT
SYSTEM BAG RETRIEVAL 10MM (BASKET) ×2 IMPLANT
WATER STERILE IRR 500ML POUR (IV SOLUTION) ×2 IMPLANT

## 2024-05-05 NOTE — Discharge Instructions (Signed)

## 2024-05-05 NOTE — Transfer of Care (Signed)
 Immediate Anesthesia Transfer of Care Note  Patient: Debra [name]  Procedure(s) Performed: CHOLECYSTECTOMY, ROBOT-ASSISTED, LAPAROSCOPIC INDOCYANINE GREEN  FLUORESCENCE IMAGING (ICG)  Patient Location: PACU  Anesthesia Type:General  Level of Consciousness: drowsy  Airway & Oxygen Therapy: Patient Spontanous Breathing  Post-op Assessment: Report given to RN and Post -op Vital signs reviewed and stable  Post vital signs: Reviewed and stable  Last Vitals:  Vitals Value Taken Time  BP 141/89 05/05/24 11:31  Temp 36.3 C 05/05/24 11:21  Pulse 74 05/05/24 11:33  Resp 14 05/05/24 11:33  SpO2 97 % 05/05/24 11:33  Vitals shown include unfiled device data.  Last Pain:  Vitals:   05/05/24 1121  TempSrc:   PainSc: 0-No pain         Complications: There were no known notable events for this encounter.

## 2024-05-05 NOTE — Anesthesia Procedure Notes (Signed)
 Procedure Name: Intubation Date/Time: 05/05/2024 9:44 AM  Performed by: Bonnetta Jimmey SAUNDERS, CRNAPre-anesthesia Checklist: Patient identified, Emergency Drugs available, Suction available and Patient being monitored Patient Re-evaluated:Patient Re-evaluated prior to induction Oxygen Delivery Method: Circle system utilized Preoxygenation: Pre-oxygenation with 100% oxygen Induction Type: IV induction Ventilation: Mask ventilation without difficulty Laryngoscope Size: McGrath and 3 Grade View: Grade I Tube type: Oral Tube size: 6.5 mm Number of attempts: 1 Airway Equipment and Method: Stylet and Oral airway Placement Confirmation: ETT inserted through vocal cords under direct vision, positive ETCO2 and breath sounds checked- equal and bilateral Secured at: 21 cm Tube secured with: Tape Dental Injury: Teeth and Oropharynx as per pre-operative assessment

## 2024-05-05 NOTE — Interval H&P Note (Signed)
 History and Physical Interval Note:  05/05/2024 9:05 AM  Debra [name]  has presented today for surgery, with the diagnosis of Symptomatic cholelithiasis.  The various methods of treatment have been discussed with the patient and family. After consideration of risks, benefits and other options for treatment, the patient has consented to  Procedure(s) with comments: CHOLECYSTECTOMY, ROBOT-ASSISTED, LAPAROSCOPIC (N/A) - w/ICG as a surgical intervention.  The patient's history has been reviewed, patient examined, no change in status, stable for surgery.  I have reviewed the patient's chart and labs.  Questions were answered to the patient's satisfaction.     Kristina Bertone

## 2024-05-05 NOTE — Anesthesia Preprocedure Evaluation (Addendum)
 Anesthesia Evaluation  Patient identified by MRN, date of birth, ID band Patient awake    Reviewed: Allergy & Precautions, H&P , NPO status , Patient's Chart, lab work & pertinent test results  Airway Mallampati: II  TM Distance: >3 FB Neck ROM: Full    Dental no notable dental hx.    Pulmonary former smoker   Pulmonary exam normal breath sounds clear to auscultation       Cardiovascular hypertension, Normal cardiovascular exam Rhythm:Regular Rate:Normal  Echo 06-10-20 EF 60-65%, Grade I diastolic dysfunction   Neuro/Psych  negative psych ROS   GI/Hepatic Neg liver ROS,GERD  ,,  Endo/Other  negative endocrine ROS    Renal/GU negative Renal ROS  negative genitourinary   Musculoskeletal negative musculoskeletal ROS (+)    Abdominal   Peds negative pediatric ROS (+)  Hematology negative hematology ROS (+)   Anesthesia Other Findings Lumbar back pain  Pre-diabetes Hypertension Hyperlipidemia Allergies  GERD (gastroesophageal reflux disease) pre diabetes  Grade I diastolic dysfunction    Reproductive/Obstetrics negative OB ROS                              Anesthesia Physical Anesthesia Plan  ASA: 2  Anesthesia Plan: General   Post-op Pain Management: Gabapentin  PO (pre-op)*, Tylenol  PO (pre-op)*, Celebrex  PO (pre-op)* and Regional block*   Induction: Intravenous  PONV Risk Score and Plan: 4 or greater and Propofol  infusion, Ondansetron , Dexamethasone  and Midazolam   Airway Management Planned: Oral ETT  Additional Equipment:   Intra-op Plan:   Post-operative Plan: Extubation in OR  Informed Consent: I have reviewed the patients History and Physical, chart, labs and discussed the procedure including the risks, benefits and alternatives for the proposed anesthesia with the patient or authorized representative who has indicated his/her understanding and acceptance.     Dental  Advisory Given  Plan Discussed with: Anesthesiologist, CRNA and Surgeon  Anesthesia Plan Comments:          Anesthesia Quick Evaluation

## 2024-05-05 NOTE — Op Note (Signed)
  Procedure Date:  05/05/2024  Pre-operative Diagnosis:  Symptomatic cholelithiasis, hepatic steatosis  Post-operative Diagnosis: Symptomatic cholelithiasis, hepatic steatosis  Procedure:  Robotic assisted cholecystectomy with ICG FireFly cholangiogram  Surgeon:  Aloysius Sheree Plant, MD  Anesthesia:  General endotracheal  Estimated Blood Loss:  10 ml  Specimens:  gallbladder  Complications:  None  Indications for Procedure:  This is a 68 y.o. female who presents with abdominal pain and workup revealing symptomatic cholelithiasis.  The benefits, complications, treatment options, and expected outcomes were discussed with the patient. The risks of bleeding, infection, recurrence of symptoms, failure to resolve symptoms, bile duct damage, bile duct leak, retained common bile duct stone, bowel injury, and need for further procedures were all discussed with the patient and she was willing to proceed.  Description of Procedure: The patient was correctly identified in the preoperative area and brought into the operating room.  The patient was placed supine with VTE prophylaxis in place.  Appropriate time-outs were performed.  Anesthesia was induced and the patient was intubated.  Appropriate antibiotics were infused.  The abdomen was prepped and draped in a sterile fashion. An infraumbilical incision was made. A cutdown technique was used to enter the abdominal cavity without injury, and a 12 mm robotic port was inserted.  Pneumoperitoneum was obtained with appropriate opening pressures.  Three 8-mm ports were placed in the mid abdomen at the level of the umbilicus under direct visualization.  The DaVinci platform was docked, camera targeted, and instruments were placed under direct visualization.  The gallbladder was identified.  The fundus was grasped and retracted cephalad.  Adhesions were lysed bluntly and with electrocautery. The infundibulum was grasped and retracted laterally, exposing the  peritoneum overlying the gallbladder.  This was incised with electrocautery and extended on either side of the gallbladder.  FireFly cholangiogram was then obtained, and we were able to clearly identify the cystic duct and common bile duct.  The cystic duct and cystic artery were carefully dissected with combination of cautery and blunt dissection.  Both were clipped twice proximally and once distally, cutting in between.  The gallbladder was taken from the gallbladder fossa in a retrograde fashion with electrocautery. In doing so, there was a small amount of bile spillage.  The gallbladder was placed in an Endocatch bag. The liver bed was inspected and any bleeding was controlled with electrocautery. The right upper quadrant was then inspected again revealing intact clips, no bleeding, and no ductal injury.  The area was thoroughly irrigated.  The 8 mm ports were removed under direct visualization and the 12 mm port was removed.  The Endocatch bag was brought out via the umbilical incision. The fascial opening was closed using 0 vicryl suture.  Local anesthetic was infused in all incisions and the incisions were closed with 4-0 Monocryl.  The wounds were cleaned and sealed with DermaBond.  The patient was emerged from anesthesia and extubated and brought to the recovery room for further management.  The patient tolerated the procedure well and all counts were correct at the end of the case.   Aloysius Sheree Plant, MD

## 2024-05-05 NOTE — Anesthesia Postprocedure Evaluation (Signed)
 Anesthesia Post Note  Patient: Debra [name]  Procedure(s) Performed: CHOLECYSTECTOMY, ROBOT-ASSISTED, LAPAROSCOPIC INDOCYANINE GREEN  FLUORESCENCE IMAGING (ICG)  Patient location during evaluation: PACU Anesthesia Type: General Level of consciousness: awake and alert Pain management: pain level controlled Vital Signs Assessment: post-procedure vital signs reviewed and stable Respiratory status: spontaneous breathing, nonlabored ventilation and respiratory function stable Cardiovascular status: blood pressure returned to baseline and stable Postop Assessment: no apparent nausea or vomiting Anesthetic complications: no   There were no known notable events for this encounter.   Last Vitals:  Vitals:   05/05/24 1326 05/05/24 1329  BP: (!) 170/100 (!) 163/88  Pulse: 99   Resp: 18   Temp: (!) 36.3 C   SpO2: 100%     Last Pain:  Vitals:   05/05/24 1326  TempSrc: Temporal  PainSc: 3                  Debra [name]

## 2024-05-06 ENCOUNTER — Encounter: Payer: Self-pay | Admitting: Surgery

## 2024-05-06 LAB — SURGICAL PATHOLOGY

## 2024-05-07 ENCOUNTER — Telehealth: Payer: Self-pay | Admitting: Surgery

## 2024-05-07 NOTE — Telephone Encounter (Signed)
 Pt is asking when she could or should continue her regular medications back. She had on 05-05-2024.on her gallbladder. Pt ph # is [phone] and advise.

## 2024-05-08 DIAGNOSIS — E119 Type 2 diabetes mellitus without complications: Secondary | ICD-10-CM | POA: Diagnosis not present

## 2024-05-20 ENCOUNTER — Encounter: Payer: Self-pay | Admitting: Surgery

## 2024-05-20 ENCOUNTER — Ambulatory Visit: Admitting: Surgery

## 2024-05-20 VITALS — BP 132/77 | HR 75 | Temp 98.1°F | Ht 65.0 in | Wt 170.4 lb

## 2024-05-20 DIAGNOSIS — K8020 Calculus of gallbladder without cholecystitis without obstruction: Secondary | ICD-10-CM

## 2024-05-20 DIAGNOSIS — Z09 Encounter for follow-up examination after completed treatment for conditions other than malignant neoplasm: Secondary | ICD-10-CM

## 2024-05-20 NOTE — Progress Notes (Signed)
 05/20/2024  HPI: Debra [name] is a 68 y.o. female s/p robotic assisted cholecystectomy on 05/05/2024.  Patient presents today for follow-up.  She reports that she has been doing well and denies any worsening pain.  She did try food with some more fat content in it and had diarrhea but she has been sticking with a low-fat diet as she is also trying to lose weight.  Vital signs: BP 132/77   Pulse 75   Temp 98.1 F (36.7 C) (Oral)   Ht 5' 5 (1.651 m)   Wt 170 lb 6.4 oz (77.3 kg)   LMP  (LMP Unknown)   SpO2 98%   BMI 28.36 kg/m    Physical Exam: Constitutional: No acute distress Abdomen: Soft, nondistended, nontender to palpation.  Incisions are healing well are clean, dry, intact.  Assessment/Plan: This is a 68 y.o. female s/p robotic assisted cholecystectomy per  - Discussed with patient that it is not uncommon for patients to not just right away to not having a gallbladder and it may take a few weeks for diarrhea to improve.  Although there is no restriction on her dietary intake at this point, I do think it is fine to continue a low-fat diet particularly as she is trying to lose weight. - Discussed with her that if in 4 more weeks there is still issues with diarrhea after p.o. intake, she should let us  know as we would be able to try cholestyramine to see if this helps her. - Otherwise follow-up as needed.   Aloysius Sheree Plant, MD [facility]

## 2024-05-20 NOTE — Patient Instructions (Signed)

## 2024-05-25 ENCOUNTER — Encounter

## 2024-06-03 ENCOUNTER — Other Ambulatory Visit: Payer: Self-pay | Admitting: Nurse Practitioner

## 2024-06-03 DIAGNOSIS — I10 Essential (primary) hypertension: Secondary | ICD-10-CM

## 2024-06-03 DIAGNOSIS — E1165 Type 2 diabetes mellitus with hyperglycemia: Secondary | ICD-10-CM

## 2024-06-08 DIAGNOSIS — E119 Type 2 diabetes mellitus without complications: Secondary | ICD-10-CM | POA: Diagnosis not present

## 2024-06-15 ENCOUNTER — Ambulatory Visit
Admission: RE | Admit: 2024-06-15 | Discharge: 2024-06-15 | Disposition: A | Discharge status: Home / Self Care | Source: Ambulatory Visit | Attending: Nurse Practitioner

## 2024-06-15 DIAGNOSIS — Z1231 Encounter for screening mammogram for malignant neoplasm of breast: Secondary | ICD-10-CM | POA: Diagnosis not present

## 2024-06-17 ENCOUNTER — Ambulatory Visit: Payer: Self-pay

## 2024-06-17 NOTE — Telephone Encounter (Signed)
 FYI Only or Action Required?: Action required by provider: request for appointment. Pt unable to come in until PM, agreeable to UC if she hasn't heard from clinic about appt.  Patient was last seen in primary care on 03/05/2024 by Debra App, NP.  Called Nurse Triage reporting Cough.  Symptoms began several weeks ago.  Interventions attempted: OTC medications: Mucinex, Coricidin, and Rest, hydration, or home remedies.  Symptoms are: unchanged.  Triage Disposition: See HCP Within 4 Hours (Or PCP Triage)  Patient/caregiver understands and will follow disposition?: Yes           Copied from CRM [id number]. Topic: Clinical - Red Word Triage >> Jun 17, 2024  8:09 AM Frederich PARAS wrote: [facility] that prompted transfer to Nurse Triage: chest pressure, bad caugh  pt calling, to get availability for scheduling however have red words.  bad caugh, cant stop caughing, congestion , chest, chest pressure, no fever, hard time getting deep breath Reason for Disposition  [1] MILD difficulty breathing (e.g., minimal/no SOB at rest, SOB with walking, pulse < 100) AND [2] still present when not coughing  Answer Assessment - Initial Assessment Questions 1. ONSET: When did the cough begin?      X 2 weeks ago 3. SPUTUM: Describe the color of your sputum (e.g., none, dry cough; clear, white, yellow, green)     Green 4. HEMOPTYSIS: Are you coughing up any blood? If Yes, ask: How much? (e.g., flecks, streaks, tablespoons, etc.)     None 5. DIFFICULTY BREATHING: Are you having difficulty breathing? If Yes, ask: How bad is it? (e.g., mild, moderate, severe)      Pt reports she feels like she can't get a deep breath 6. FEVER: Do you have a fever? If Yes, ask: What is your temperature, how was it measured, and when did it start?     None 7. CARDIAC HISTORY: Do you have any history of heart disease? (e.g., heart attack, congestive heart failure)      None 8. LUNG HISTORY: Do you have any  history of lung disease?  (e.g., pulmonary embolus, asthma, emphysema)     None 9. PE RISK FACTORS: Do you have a history of blood clots? (or: recent major surgery, recent prolonged travel, bedridden)     None 10. OTHER SYMPTOMS: Do you have any other symptoms? (e.g., runny nose, wheezing, chest pain)       Nasal congestion, chest congestion  Protocols used: Cough - Acute Productive-A-AH

## 2024-06-19 ENCOUNTER — Ambulatory Visit: Payer: Self-pay | Admitting: Nurse Practitioner

## 2024-06-19 ENCOUNTER — Ambulatory Visit: Payer: Self-pay

## 2024-06-19 ENCOUNTER — Ambulatory Visit
Admission: EM | Admit: 2024-06-19 | Discharge: 2024-06-19 | Disposition: A | Discharge status: Home / Self Care | Attending: Emergency Medicine | Admitting: Emergency Medicine

## 2024-06-19 ENCOUNTER — Encounter: Admitting: Nurse Practitioner

## 2024-06-19 ENCOUNTER — Encounter: Payer: Self-pay | Admitting: Emergency Medicine

## 2024-06-19 DIAGNOSIS — J069 Acute upper respiratory infection, unspecified: Secondary | ICD-10-CM

## 2024-06-19 MED ORDER — AMOXICILLIN-POT CLAVULANATE 875-125 MG PO TABS: 1.0000 | ORAL_TABLET | Freq: Two times a day (BID) | ORAL | 0 refills | Status: DC

## 2024-06-19 MED ORDER — PREDNISONE 10 MG (21) PO TBPK: ORAL_TABLET | Freq: Every day | ORAL | 0 refills | Status: DC

## 2024-06-19 MED ORDER — ALBUTEROL SULFATE HFA 108 (90 BASE) MCG/ACT IN AERS: 2.0000 | INHALATION_SPRAY | RESPIRATORY_TRACT | 0 refills | Status: AC | PRN

## 2024-06-19 NOTE — Discharge Instructions (Signed)
 Begin Augmentin  twice daily for 7 days to clear any bacteria causing symptoms to linger  For shortness of breath, given prednisone  every morning with food as directed to open and relax the airway, avoid ibuprofen  while taking but may use Tylenol  if needed for aches and pains  You may use inhaler taking 2 puffs every 4 hours as needed for shortness of breath  You can take Tylenol   as needed for fever reduction and pain relief.   For cough: honey 1/2 to 1 teaspoon (you can dilute the honey in water  or another fluid).  You can also use guaifenesin and dextromethorphan for cough. You can use a humidifier for chest congestion and cough.  If you don't have a humidifier, you can sit in the bathroom with the hot shower running.      For sore throat: try warm salt water  gargles, cepacol lozenges, throat spray, warm tea or water  with lemon/honey, popsicles or ice, or OTC cold relief medicine for throat discomfort.   For congestion: take a daily anti-histamine like Zyrtec, Claritin, and a oral decongestant, such as pseudoephedrine.  You can also use Flonase 1-2 sprays in each nostril daily.   It is important to stay hydrated: drink plenty of fluids (water , gatorade/powerade/pedialyte, juices, or teas) to keep your throat moisturized and help further relieve irritation/discomfort.

## 2024-06-19 NOTE — Telephone Encounter (Signed)
 This RN attempted to call pt back after call was disconnected during triage call. No answer, left voicemail with callback number.      Copied from CRM [id number]. Topic: Clinical - Red Word Triage >> Jun 19, 2024  9:34 AM Roselie BROCKS wrote: [facility] that prompted transfer to Nurse Triage: Patient has a chest cold. Achy and bad cough, possible fever Answer Assessment - Initial Assessment Questions Patient taking Coricidin HBP and Flonase for symptoms. Patient denies wheezing.      1. ONSET: When did the cough begin?      2.5 weeks  2. SEVERITY: How bad is the cough today?      Mild during the day and severe at night  3. SPUTUM: Describe the color of your sputum (e.g., none, dry cough; clear, white, yellow, green)     Green  4. HEMOPTYSIS: Are you coughing up any blood? If Yes, ask: How much? (e.g., flecks, streaks, tablespoons, etc.)     Denies  5. DIFFICULTY BREATHING: Are you having difficulty breathing? If Yes, ask: How bad is it? (e.g., mild, moderate, severe)      Moderate hard to get a deep breath  6. FEVER: Do you have a fever? If Yes, ask: What is your temperature, how was it measured, and when did it start?     Unsure  7. OTHER SYMPTOMS: Do you have any other symptoms? (e.g., runny nose, wheezing, chest pain)       Runny nose, fatigue  Protocols used: Cough - Acute Productive-A-AH

## 2024-06-19 NOTE — Telephone Encounter (Signed)
 Patient returned call. No same day availability in PCP office for acute visit. States she had a previously scheduled f/u with PCP for today at 1500. Appt is showing as cancelled/provider, but patient was not notified.   This RN attempted to call CAL. Receiver was unable to hear this RN x 3 attempts. Returned to pt call and she was also unable to hear this RN.

## 2024-06-19 NOTE — ED Provider Notes (Signed)
 Debra [name]    CSN: [id number] Arrival date & time: 06/19/24  1015      History   Chief Complaint Chief Complaint  Patient presents with   Cough   Nasal Congestion    HPI Debra [name] is a 68 y.o. female.   Patient presents for evaluation of intermittent headaches, sore throat, nasal congestion, productive cough with green mucus and shortness of breath at rest beginning 2 weeks ago.  Symptoms were improving but began to worsen over the past 7 days.  Denies presence of wheezing.  No known sick contact prior but has been has similar symptoms.  Has attempted use of over-the-counter Coricidin but this makes her drowsy, antihistamines and nasal spray.  Decreased appetite but tolerable to food and liquids.  Past Medical History:  Diagnosis Date   Allergies    GERD (gastroesophageal reflux disease)    Grade I diastolic dysfunction    Hyperlipidemia    Hypertension    Insomnia    a.) uses melatonin PRN   Lumbar back pain    Neuropathy    RAD (reactive airway disease)    T2DM (type 2 diabetes mellitus) (HCC)     Patient Active Problem List   Diagnosis Date Noted   Calculus of gallbladder without cholecystitis without obstruction 05/05/2024   Allergic rhinitis 03/11/2024   Neuropathy of both feet 03/11/2024   Nipple dermatitis 03/11/2024   Non-recurrent acute serous otitis media of right ear 01/29/2022   Diabetes type 2, controlled (HCC) 02/16/2021   History of colonic polyps    Polyp of ascending colon    GERD (gastroesophageal reflux disease) 09/16/2019   Essential hypertension 09/16/2019   Family history of breast cancer in mother 09/02/2018   Plantar fasciitis of left foot 10/30/2017   HNP (herniated nucleus pulposus), lumbar 10/16/2017   Spinal stenosis of lumbar region 10/16/2017   Radiculopathy, lumbosacral region 09/02/2017   Benign neoplasm of colon 03/30/2015   Hypercholesteremia 03/30/2015   RAD (reactive airway disease) 03/30/2015   Arthralgia of  temporomandibular joint 03/30/2015   H/O adenomatous polyp of colon 08/13/2014    Past Surgical History:  Procedure Laterality Date   CARPAL TUNNEL RELEASE Right    CHONDROPLASTY Right 03/01/2023   Procedure: Right medial meniscus root repair, chondroplasty;  Surgeon: Tobie Priest, MD;  Location: [facility];  Service: Orthopedics;  Laterality: Right;   COLONOSCOPY WITH PROPOFOL  N/A 12/30/2020   Procedure: COLONOSCOPY WITH PROPOFOL ;  Surgeon: Jinny Carmine, MD;  Location: [facility];  Service: Endoscopy;  Laterality: N/A;   INDOCYANINE GREEN  FLUORESCENCE IMAGING (ICG)  05/05/2024   Procedure: INDOCYANINE GREEN  FLUORESCENCE IMAGING (ICG);  Surgeon: Desiderio Schanz, MD;  Location: [facility];  Service: General;;   KNEE ARTHROSCOPY WITH MENISCAL REPAIR Right 03/01/2023   Procedure: Right medial meniscus root repair, chondroplasty;  Surgeon: Tobie Priest, MD;  Location: [facility];  Service: Orthopedics;  Laterality: Right;   LUMBAR LAMINECTOMY/DECOMPRESSION MICRODISCECTOMY Left 10/16/2017   Procedure: Microlumbar decompression L4-5, L5-S1 left;  Surgeon: Duwayne Purchase, MD;  Location: [facility];  Service: Orthopedics;  Laterality: Left;  120 mins   PLANTAR FASCIA RELEASE Left    POLYPECTOMY  12/30/2020   Procedure: POLYPECTOMY;  Surgeon: Jinny Carmine, MD;  Location: [facility];  Service: Endoscopy;;   RHINOPLASTY  1988   SHOULDER SURGERY Right 2011   TEMPOROMANDIBULAR JOINT SURGERY  1990   TOTAL ABDOMINAL HYSTERECTOMY  1998    OB History   No obstetric history on file.  Home Medications    Prior to Admission medications   Medication Sig Start Date End Date Taking? Authorizing Provider  albuterol  (VENTOLIN  HFA) 108 (90 Base) MCG/ACT inhaler Inhale 2 puffs into the lungs every 4 (four) hours as needed for wheezing or shortness of breath. 06/19/24  Yes Bobbyjoe Pabst, Shelba SAUNDERS, NP  amoxicillin -clavulanate (AUGMENTIN ) 875-125 MG tablet Take 1 tablet by mouth every 12  (twelve) hours. 06/19/24  Yes Krystian Younglove R, NP  predniSONE  (STERAPRED UNI-PAK 21 TAB) 10 MG (21) TBPK tablet Take by mouth daily. Take 6 tabs by mouth daily  for 1 days, then 5 tabs for 1 days, then 4 tabs for 1 days, then 3 tabs for 1 days, 2 tabs for 1 days, then 1 tab by mouth daily for 1 days 06/19/24  Yes Laketia Vicknair R, NP  acetaminophen  (TYLENOL ) 500 MG tablet Take 2 tablets (1,000 mg total) by mouth every 6 (six) hours as needed for mild pain (pain score 1-3). 05/05/24   Desiderio Schanz, MD  Biotin 1000 MCG CHEW Chew 1,000 mcg by mouth daily.    [provider]  Cyanocobalamin  (VITAMIN B-12 PO) Take 1 tablet by mouth daily.    [provider]  EPINEPHrine  0.3 mg/0.3 mL IJ SOAJ injection Inject 0.3 mg into the muscle as needed for anaphylaxis. 07/15/23   Bernardo Fend, DO  hydrochlorothiazide  (HYDRODIURIL ) 25 MG tablet Take 1 tablet (25 mg total) by mouth daily. 04/08/24   Gretel App, NP  losartan  (COZAAR ) 50 MG tablet TAKE 1 TABLET (50 MG TOTAL) BY MOUTH DAILY. 06/03/24   Gretel App, NP  MAGNESIUM  CITRATE PO Take 12 mg by mouth daily.    [provider]  Melatonin 10 MG TABS Take 10 mg by mouth at bedtime.    [provider]  metFORMIN  (GLUCOPHAGE ) 1000 MG tablet TAKE 1 TABLET BY MOUTH TWICE A DAY WITH MEALS 06/03/24   Gretel App, NP  omeprazole  (PRILOSEC) 20 MG capsule Take 20 mg by mouth daily.    [provider]  REPATHA  SURECLICK 140 MG/ML SOAJ INJECT 140 MG INTO THE SKIN EVERY 14 (FOURTEEN) DAYS. 06/03/24   Gretel App, NP    Family History Family History  Adopted: Yes  Problem Relation Age of Onset   Breast cancer Mother 70   Alcohol abuse Mother    Hyperlipidemia Sister    Hyperlipidemia Brother    Hyperlipidemia Brother    Obesity Son    Hypertension Son     Social History Social History   Tobacco Use   Smoking status: Former    Types: Cigarettes   Smokeless tobacco: Never   Tobacco comments:    33 years quit    Vaping Use   Vaping status: Never Used  Substance Use Topics   Alcohol use: Yes    Comment: Socially   Drug use: No     Allergies   Fenofibrate , Nexlizet  [bempedoic acid-ezetimibe ], Other, Pravastatin sodium, and Latex   Review of Systems Review of Systems   Physical Exam Triage Vital Signs ED Triage Vitals  Encounter Vitals Group     BP 06/19/24 1049 122/73     Girls Systolic BP Percentile --      Girls Diastolic BP Percentile --      Boys Systolic BP Percentile --      Boys Diastolic BP Percentile --      Pulse Rate 06/19/24 1049 83     Resp 06/19/24 1049 18     Temp 06/19/24 1049 98.5 F (  36.9 C)     Temp Source 06/19/24 1049 Oral     SpO2 06/19/24 1049 97 %     Weight --      Height --      Head Circumference --      Peak Flow --      Pain Score 06/19/24 1047 0     Pain Loc --      Pain Education --      Exclude from Growth Chart --    No data found.  Updated Vital Signs BP 122/73 (BP Location: Left Arm)   Pulse 83   Temp 98.5 F (36.9 C) (Oral)   Resp 18   LMP  (LMP Unknown)   SpO2 97%   Visual Acuity Right Eye Distance:   Left Eye Distance:   Bilateral Distance:    Right Eye Near:   Left Eye Near:    Bilateral Near:     Physical Exam Constitutional:      Appearance: Normal appearance.  HENT:     Head: Normocephalic.     Right Ear: Tympanic membrane, ear canal and external ear normal.     Left Ear: Tympanic membrane, ear canal and external ear normal.     Nose: Congestion present.     Left Sinus: Maxillary sinus tenderness present.     Mouth/Throat:     Pharynx: No oropharyngeal exudate or posterior oropharyngeal erythema.  Eyes:     Extraocular Movements: Extraocular movements intact.  Cardiovascular:     Rate and Rhythm: Normal rate and regular rhythm.     Pulses: Normal pulses.     Heart sounds: Normal heart sounds.  Pulmonary:     Effort: Pulmonary effort is normal.     Breath sounds: Normal breath sounds.  Musculoskeletal:      Cervical back: Normal range of motion and neck supple.  Neurological:     Mental Status: She is alert and oriented to person, place, and time. Mental status is at baseline.      UC Treatments / Results  Labs (all labs ordered are listed, but only abnormal results are displayed) Labs Reviewed - No data to display  EKG   Radiology No results found.  Procedures Procedures (including critical care time)  Medications Ordered in UC Medications - No data to display  Initial Impression / Assessment and Plan / UC Course  I have reviewed the triage vital signs and the nursing notes.  Pertinent labs & imaging results that were available during my care of the patient were reviewed by me and considered in my medical decision making (see chart for details).   Acute URI  Patient is in no signs of distress nor toxic appearing.  Vital signs are stable.  Low suspicion for pneumonia, pneumothorax or bronchitis and therefore will defer imaging.  Symptoms persisting for 2 weeks without signs of resolution empirically placed on antibiotics, prescribed Augmentin  and additionally prescribed prednisone  and albuterol  inhaler, has availability of Tessalon  at home. May use additional over-the-counter medications as needed for supportive care.  May follow-up with urgent care as needed if symptoms persist or worsen.  Note given.   Final Clinical Impressions(s) / UC Diagnoses   Final diagnoses:  Acute URI     Discharge Instructions      Begin Augmentin  twice daily for 7 days to clear any bacteria causing symptoms to linger  For shortness of breath, given prednisone  every morning with food as directed to open and relax the airway, avoid ibuprofen   while taking but may use Tylenol  if needed for aches and pains  You may use inhaler taking 2 puffs every 4 hours as needed for shortness of breath  You can take Tylenol   as needed for fever reduction and pain relief.   For cough: honey 1/2 to 1  teaspoon (you can dilute the honey in water  or another fluid).  You can also use guaifenesin and dextromethorphan for cough. You can use a humidifier for chest congestion and cough.  If you don't have a humidifier, you can sit in the bathroom with the hot shower running.      For sore throat: try warm salt water  gargles, cepacol lozenges, throat spray, warm tea or water  with lemon/honey, popsicles or ice, or OTC cold relief medicine for throat discomfort.   For congestion: take a daily anti-histamine like Zyrtec, Claritin, and a oral decongestant, such as pseudoephedrine.  You can also use Flonase 1-2 sprays in each nostril daily.   It is important to stay hydrated: drink plenty of fluids (water , gatorade/powerade/pedialyte, juices, or teas) to keep your throat moisturized and help further relieve irritation/discomfort.    ED Prescriptions     Medication Sig Dispense Auth. Provider   amoxicillin -clavulanate (AUGMENTIN ) 875-125 MG tablet Take 1 tablet by mouth every 12 (twelve) hours. 14 tablet Addysin Porco R, NP   predniSONE  (STERAPRED UNI-PAK 21 TAB) 10 MG (21) TBPK tablet Take by mouth daily. Take 6 tabs by mouth daily  for 1 days, then 5 tabs for 1 days, then 4 tabs for 1 days, then 3 tabs for 1 days, 2 tabs for 1 days, then 1 tab by mouth daily for 1 days 21 tablet Leighton Luster R, NP   albuterol  (VENTOLIN  HFA) 108 (90 Base) MCG/ACT inhaler Inhale 2 puffs into the lungs every 4 (four) hours as needed for wheezing or shortness of breath. 8 g Teresa Shelba SAUNDERS, NP      PDMP not reviewed this encounter.   Teresa Shelba SAUNDERS, NP 06/19/24 1131

## 2024-06-19 NOTE — ED Triage Notes (Signed)
 Patient reports chest and head congestion and sneezing x weeks. Patient tried taking OTC cough medication but states I can't take that stuff it makes me loopy.

## 2024-06-24 ENCOUNTER — Encounter: Payer: Self-pay | Admitting: *Deleted

## 2024-06-24 ENCOUNTER — Telehealth: Payer: Self-pay

## 2024-06-24 NOTE — Telephone Encounter (Signed)
 Detailed vm left asking pt if she could come 15 minutes early to her [facility] to medicare appt on tomorrow around 10:45 am    Mychart sent as well

## 2024-06-25 ENCOUNTER — Encounter: Payer: Self-pay | Admitting: Nurse Practitioner

## 2024-06-25 ENCOUNTER — Ambulatory Visit: Admitting: Nurse Practitioner

## 2024-06-25 ENCOUNTER — Other Ambulatory Visit: Payer: Self-pay | Admitting: Nurse Practitioner

## 2024-06-25 VITALS — BP 122/76 | HR 77 | Temp 98.1°F | Ht 65.75 in | Wt 165.0 lb

## 2024-06-25 DIAGNOSIS — I10 Essential (primary) hypertension: Secondary | ICD-10-CM

## 2024-06-25 DIAGNOSIS — E119 Type 2 diabetes mellitus without complications: Secondary | ICD-10-CM

## 2024-06-25 DIAGNOSIS — Z0000 Encounter for general adult medical examination without abnormal findings: Secondary | ICD-10-CM

## 2024-06-25 LAB — COMPREHENSIVE METABOLIC PANEL WITH GFR
ALT: 22 U/L (ref 0–35)
AST: 13 U/L (ref 0–37)
Albumin: 4.4 g/dL (ref 3.5–5.2)
Alkaline Phosphatase: 79 U/L (ref 39–117)
BUN: 18 mg/dL (ref 6–23)
CO2: 29 meq/L (ref 19–32)
Calcium: 10.8 mg/dL — ABNORMAL HIGH (ref 8.4–10.5)
Chloride: 96 meq/L (ref 96–112)
Creatinine, Ser: 0.58 mg/dL (ref 0.40–1.20)
GFR: 94.61 mL/min (ref >60.00)
Glucose, Bld: 69 mg/dL — ABNORMAL LOW (ref 70–99)
Potassium: 4.4 meq/L (ref 3.5–5.1)
Sodium: 137 meq/L (ref 135–145)
Total Bilirubin: 0.3 mg/dL (ref 0.2–1.2)
Total Protein: 7.1 g/dL (ref 6.0–8.3)

## 2024-06-25 LAB — HEMOGLOBIN A1C: Hgb A1c MFr Bld: 6.4 % (ref 4.6–6.5)

## 2024-06-25 NOTE — Addendum Note (Signed)
 Addended by: Brenetta Penny on: 06/25/2024 12:23 PM   Modules accepted: Orders

## 2024-06-25 NOTE — Progress Notes (Signed)
 Subjective:    Debra [name] is a 68 y.o. female who presents for a Welcome to Medicare exam.   Cardiac Risk Factors include: diabetes mellitus;hypertension      Objective:    Today's Vitals   06/25/24 1102  BP: 122/76  Pulse: 77  Temp: 98.1 F (36.7 C)  SpO2: 98%  Weight: 165 lb (74.8 kg)  Height: 5' 5.75 (1.67 m)  Body mass index is 26.84 kg/m.  Medications Outpatient Encounter Medications as of 06/25/2024  Medication Sig   acetaminophen  (TYLENOL ) 500 MG tablet Take 2 tablets (1,000 mg total) by mouth every 6 (six) hours as needed for mild pain (pain score 1-3).   albuterol  (VENTOLIN  HFA) 108 (90 Base) MCG/ACT inhaler Inhale 2 puffs into the lungs every 4 (four) hours as needed for wheezing or shortness of breath.   amoxicillin -clavulanate (AUGMENTIN ) 875-125 MG tablet Take 1 tablet by mouth every 12 (twelve) hours.   Biotin 1000 MCG CHEW Chew 1,000 mcg by mouth daily.   Cyanocobalamin  (VITAMIN B-12 PO) Take 1 tablet by mouth daily.   EPINEPHrine  0.3 mg/0.3 mL IJ SOAJ injection Inject 0.3 mg into the muscle as needed for anaphylaxis.   hydrochlorothiazide  (HYDRODIURIL ) 25 MG tablet Take 1 tablet (25 mg total) by mouth daily.   losartan  (COZAAR ) 50 MG tablet TAKE 1 TABLET (50 MG TOTAL) BY MOUTH DAILY.   MAGNESIUM  CITRATE PO Take 12 mg by mouth daily.   Melatonin 10 MG TABS Take 10 mg by mouth at bedtime.   metFORMIN  (GLUCOPHAGE ) 1000 MG tablet TAKE 1 TABLET BY MOUTH TWICE A DAY WITH MEALS   predniSONE  (STERAPRED UNI-PAK 21 TAB) 10 MG (21) TBPK tablet Take by mouth daily. Take 6 tabs by mouth daily  for 1 days, then 5 tabs for 1 days, then 4 tabs for 1 days, then 3 tabs for 1 days, 2 tabs for 1 days, then 1 tab by mouth daily for 1 days   REPATHA  SURECLICK 140 MG/ML SOAJ INJECT 140 MG INTO THE SKIN EVERY 14 (FOURTEEN) DAYS.   omeprazole  (PRILOSEC) 20 MG capsule Take 20 mg by mouth daily. (Patient not taking: Reported on 06/25/2024)   No facility-administered encounter  medications on file as of 06/25/2024.     History: Past Medical History:  Diagnosis Date   Allergies    Allergy    Seasonal   Arthritis    GERD (gastroesophageal reflux disease)    Grade I diastolic dysfunction    Hyperlipidemia    Hypertension    Insomnia    a.) uses melatonin PRN   Lumbar back pain    Neuropathy    RAD (reactive airway disease)    T2DM (type 2 diabetes mellitus) (HCC)    Past Surgical History:  Procedure Laterality Date   CARPAL TUNNEL RELEASE Bilateral    CHOLECYSTECTOMY  05-05-24   CHONDROPLASTY Right 03/01/2023   Procedure: Right medial meniscus root repair, chondroplasty;  Surgeon: Tobie Priest, MD;  [facility];  Service: Orthopedics;  Laterality: Right;   COLONOSCOPY WITH PROPOFOL  N/A 12/30/2020   Procedure: COLONOSCOPY WITH PROPOFOL ;  Surgeon: Jinny Carmine, MD;  Location: [facility];  Service: Endoscopy;  Laterality: N/A;   INDOCYANINE GREEN  FLUORESCENCE IMAGING (ICG)  05/05/2024   Procedure: INDOCYANINE GREEN  FLUORESCENCE IMAGING (ICG);  Surgeon: Desiderio Schanz, MD;  Location: [facility];  Service: General;;   KNEE ARTHROSCOPY WITH MENISCAL REPAIR Right 03/01/2023   Procedure: Right medial meniscus root repair, chondroplasty;  Surgeon: Tobie Priest, MD;  Location:  [facility];  Service: Orthopedics;  Laterality: Right;   LUMBAR LAMINECTOMY/DECOMPRESSION MICRODISCECTOMY Left 10/16/2017   Procedure: Microlumbar decompression L4-5, L5-S1 left;  Surgeon: Duwayne Purchase, MD;  Location: [facility];  Service: Orthopedics;  Laterality: Left;  120 mins   PLANTAR FASCIA RELEASE Left    POLYPECTOMY  12/30/2020   Procedure: POLYPECTOMY;  Surgeon: Jinny Carmine, MD;  Location: [facility];  Service: Endoscopy;;   RHINOPLASTY  1988   SHOULDER SURGERY Right 2011   TEMPOROMANDIBULAR JOINT SURGERY  1990   TOTAL ABDOMINAL HYSTERECTOMY  1998   Patient stated it is partial   TUBAL LIGATION      Family History  Adopted: Yes   Problem Relation Age of Onset   Breast cancer Mother 65   Alcohol abuse Mother    Vision loss Mother    Hyperlipidemia Sister    Diabetes Sister    Crohn's disease Sister    Diabetes Brother    Hyperlipidemia Brother    Diabetes Brother    Hyperlipidemia Brother    Obesity Son    Hypertension Son    Social History   Occupational History   Not on file  Tobacco Use   Smoking status: Never    Passive exposure: Yes   Smokeless tobacco: Never   Tobacco comments:    36 years quit   Vaping Use   Vaping status: Never Used  Substance and Sexual Activity   Alcohol use: Yes    Alcohol/week: 1.0 standard drink of alcohol    Types: 1 Glasses of wine per week    Comment: Socially   Drug use: No   Sexual activity: Yes    Birth control/protection: None    Tobacco Counseling Counseling given: Not Answered Tobacco comments: 36 years quit    Immunizations and Health Maintenance Immunization History  Administered Date(s) Administered   Fluad Trivalent(High Dose 65+) 07/12/2023   Influenza Inj Mdck Quad Pf 07/13/2022   Influenza Inj Mdck Quad With Preservative 07/28/2018   Influenza,inj,Quad PF,6+ Mos 08/15/2017, 06/19/2019, 12/10/2023   Influenza-Unspecified 07/21/2021   Moderna Covid-19 Fall Seasonal Vaccine 76yrs & older 07/13/2022   Moderna Covid-19 Vaccine Bivalent Booster 34yrs & up 10/13/2021   PFIZER(Purple Top)SARS-COV-2 Vaccination 10/20/2019, 11/10/2019, 07/26/2020   PNEUMOCOCCAL CONJUGATE-20 11/02/2022   Pneumococcal Polysaccharide-23 12/10/2023   Pneumococcal-Unspecified 10/09/2015   Td 07/09/2000   Tdap 04/14/2012, 04/26/2022   Zoster Recombinant(Shingrix) 08/25/2021, 09/10/2021   Health Maintenance Due  Topic Date Due   Zoster Vaccines- Shingrix (2 of 2) 11/05/2021    Activities of Daily Living    06/25/2024   11:03 AM 06/24/2024    3:57 PM  In your present state of health, do you have any difficulty performing the following activities:  Hearing? 0 0   Vision? 0 0  Difficulty concentrating or making decisions? 0 0  Walking or climbing stairs? 0 0  Dressing or bathing? 0 0  Doing errands, shopping? 0 0  Preparing Food and eating ? N N  Using the Toilet? N N  In the past six months, have you accidently leaked urine? N N  Do you have problems with loss of bowel control? N N  Managing your Medications? N N  Managing your Finances? N N  Housekeeping or managing your Housekeeping? N N    Physical Exam   Physical Exam Constitutional:      General: She is not in acute distress.    Appearance: Normal appearance.  HENT:     Head: Normocephalic.  Cardiovascular:  Rate and Rhythm: Normal rate and regular rhythm.     Heart sounds: Normal heart sounds.  Pulmonary:     Effort: Pulmonary effort is normal.     Breath sounds: Normal breath sounds.  Skin:    General: Skin is warm and dry.  Neurological:     General: No focal deficit present.     Mental Status: She is alert.  Psychiatric:        Mood and Affect: Mood normal.        Behavior: Behavior normal.    (optional), or other factors deemed appropriate based on the beneficiary's medical and social history and current clinical standards.   Advanced Directives: Does Patient Have a Medical Advance Directive?: No Would patient like information on creating a medical advance directive?: Yes (Inpatient - patient defers creating a medical advance directive at this time - Information given)  EKG:  normal EKG, normal sinus rhythm, unchanged from previous tracings      Assessment:    This is a routine wellness examination for this patient . Yes  Vision/Hearing screen Vision Screening   Right eye Left eye Both eyes  Without correction 20/40 20/40 20/40   With correction     Hearing Screening - Comments:: Whisper test completed   Goals      Set My Weight Loss Goal     Follow Up Date 06/25/2025    - set weight loss goal    Why is this important?   Losing only 5 to 15  percent of your weight makes a big difference in your health.    Notes: Pt would like to lose about 25 pounds by next year        Depression Screen    06/25/2024   11:19 AM 06/25/2024   11:18 AM 03/05/2024    3:06 PM 07/12/2023    7:53 AM  PHQ 2/9 Scores  PHQ - 2 Score 0 0 0 0  PHQ- 9 Score 0  2 0     Fall Risk    06/25/2024   11:22 AM  Fall Risk   Falls in the past year? 1  Number falls in past yr: 0  Injury with Fall? 0  Risk for fall due to : No Fall Risks  Follow up Falls evaluation completed    Cognitive Function:        06/25/2024   11:26 AM  6CIT Screen  What Year? 0 points  What month? 0 points  What time? 0 points  Count back from 20 0 points  Months in reverse 0 points  Repeat phrase 0 points  Total Score 0 points    Patient Care Team: Gretel App, NP as PCP - General (Nurse Practitioner) Darliss Rogue, MD as PCP - Cardiology (Cardiology)     Plan:    I have personally reviewed and noted the following in the patient's chart:   Medical and social history Use of alcohol, tobacco or illicit drugs  Current medications and supplements including opioid prescriptions. Patient is not currently taking opioid prescriptions. Functional ability and status Nutritional status Physical activity Advanced directives List of other physicians Hospitalizations, surgeries, and ER visits in previous 12 months Vitals Screenings to include cognitive, depression, and falls Referrals and appointments  In addition, I have reviewed and discussed with patient certain preventive protocols, quality metrics, and best practice recommendations. A written personalized care plan for preventive services as well as general preventive health recommendations were provided to patient.     Dan Scearce  Gretel, NP 06/25/2024

## 2024-06-25 NOTE — Patient Instructions (Signed)
 Debra [name] , Thank you for taking time to come for your Medicare Wellness Visit. I appreciate your ongoing commitment to your health goals. Please review the following plan we discussed and let me know if I can assist you in the future.   These are the goals we discussed:  Goals      Set My Weight Loss Goal     Follow Up Date 06/25/2025    - set weight loss goal    Why is this important?   Losing only 5 to 15 percent of your weight makes a big difference in your health.    Notes: Pt would like to lose about 25 pounds by next year        This is a list of the screening recommended for you and due dates:  Health Maintenance  Topic Date Due   Zoster (Shingles) Vaccine (2 of 2) 11/05/2021   COVID-19 Vaccine (6 - 2025-26 season) 07/11/2024*   Flu Shot  01/05/2025*   Eye exam for diabetics  08/06/2024   Hemoglobin A1C  09/05/2024   Yearly kidney health urinalysis for diabetes  03/05/2025   Yearly kidney function blood test for diabetes  04/29/2025   Complete foot exam   06/25/2025   Medicare Annual Wellness Visit  06/25/2025   Breast Cancer Screening  06/15/2026   Colon Cancer Screening  12/31/2027   DTaP/Tdap/Td vaccine (4 - Td or Tdap) 04/26/2032   Pneumococcal Vaccine for age over 55  Completed   DEXA scan (bone density measurement)  Completed   Hepatitis C Screening  Completed   HIV Screening  Completed   Hepatitis B Vaccine  Aged Out   HPV Vaccine  Aged Out   Meningitis B Vaccine  Aged Out  *Topic was postponed. The date shown is not the original due date.

## 2024-07-01 ENCOUNTER — Ambulatory Visit: Payer: Self-pay | Admitting: Nurse Practitioner

## 2024-07-02 ENCOUNTER — Ambulatory Visit: Admitting: Nurse Practitioner

## 2024-07-02 DIAGNOSIS — K080 Exfoliation of teeth due to systemic causes: Secondary | ICD-10-CM | POA: Diagnosis not present

## 2024-07-09 DIAGNOSIS — K080 Exfoliation of teeth due to systemic causes: Secondary | ICD-10-CM | POA: Diagnosis not present

## 2024-07-14 ENCOUNTER — Encounter: Payer: Medicare Other | Admitting: Internal Medicine

## 2024-07-24 DIAGNOSIS — K080 Exfoliation of teeth due to systemic causes: Secondary | ICD-10-CM | POA: Diagnosis not present

## 2024-08-03 ENCOUNTER — Other Ambulatory Visit: Payer: Self-pay | Admitting: Nurse Practitioner

## 2024-08-03 DIAGNOSIS — I10 Essential (primary) hypertension: Secondary | ICD-10-CM

## 2024-08-06 DIAGNOSIS — K080 Exfoliation of teeth due to systemic causes: Secondary | ICD-10-CM | POA: Diagnosis not present

## 2024-08-21 ENCOUNTER — Ambulatory Visit: Payer: Self-pay | Admitting: *Deleted

## 2024-08-21 NOTE — Telephone Encounter (Signed)
 Copied from CRM #[id number]. Topic: Clinical - Red Word Triage >> Aug 21, 2024  2:13 PM Mercedes MATSU wrote: Red Word that prompted transfer to Nurse Triage: Patient called in stating that her ring finger is swollen and she wants to know what she should do.  Patient disconnected call before transfer- attempted to call patient back- no answer- left message to call office.

## 2024-08-21 NOTE — Telephone Encounter (Signed)
 Detailed vm left informing pt that she can go to [facility] in or give us  a call back to schedule an appt sometime next week

## 2024-08-21 NOTE — Telephone Encounter (Signed)
 Patient contacted-no answer. Voicemail left for patient to call back to Nurse Triage. Will route to office.

## 2024-09-01 ENCOUNTER — Other Ambulatory Visit: Payer: Self-pay | Admitting: Nurse Practitioner

## 2024-09-01 DIAGNOSIS — I10 Essential (primary) hypertension: Secondary | ICD-10-CM

## 2024-09-01 DIAGNOSIS — E1165 Type 2 diabetes mellitus with hyperglycemia: Secondary | ICD-10-CM

## 2024-09-07 DIAGNOSIS — H2513 Age-related nuclear cataract, bilateral: Secondary | ICD-10-CM | POA: Diagnosis not present

## 2024-09-07 DIAGNOSIS — E119 Type 2 diabetes mellitus without complications: Secondary | ICD-10-CM | POA: Diagnosis not present

## 2024-09-07 LAB — OPHTHALMOLOGY REPORT-SCANNED

## 2024-09-15 ENCOUNTER — Telehealth: Payer: Self-pay | Admitting: Nurse Practitioner

## 2024-09-15 NOTE — Telephone Encounter (Unsigned)
 Copied from CRM #[id number]. Topic: Clinical - Medication Refill >> Sep 15, 2024 12:32 PM Alfonso HERO wrote: Medication: metFORMIN  (GLUCOPHAGE ) 1000 MG tablet Evolocumab  (REPATHA  SURECLICK) 140 MG/ML SOAJ  hydrochlorothiazide  (HYDRODIURIL ) 25 MG tablet  losartan  (COZAAR ) 50 MG tablet   Has the patient contacted their pharmacy? No (Agent: If no, request that the patient contact the pharmacy for the refill. If patient does not wish to contact the pharmacy document the reason why and proceed with request.) (Agent: If yes, when and what did the pharmacy advise?)  This is the patient's preferred pharmacy:  [id number] - [place], [place] - [address] [place] [address] Phone: [phone] Fax: [phone]  Is this the correct pharmacy for this prescription? Yes If no, delete pharmacy and type the correct one.   Has the prescription been filled recently? Yes  Is the patient out of the medication? Yes  Has the patient been seen for an appointment in the last year OR does the patient have an upcoming appointment? Yes  Can we respond through MyChart? Yes  Agent: Please be advised that Rx refills may take up to 3 business days. We ask that you follow-up with your pharmacy.

## 2024-09-17 NOTE — Progress Notes (Signed)
 Debra [name]                                          MRN: [id number]   09/17/2024   The [facility] Quality Team Specialist reviewed this patient medical record for the purposes of chart review for care gap closure. The following were reviewed: chart review for care gap closure-kidney health evaluation for diabetes:eGFR  and uACR.    [facility] Quality Team

## 2024-10-05 ENCOUNTER — Encounter: Payer: Self-pay | Admitting: Internal Medicine

## 2024-10-05 ENCOUNTER — Ambulatory Visit: Admitting: Internal Medicine

## 2024-10-05 VITALS — BP 124/72 | HR 93 | Ht 65.75 in | Wt 165.6 lb

## 2024-10-05 DIAGNOSIS — E119 Type 2 diabetes mellitus without complications: Secondary | ICD-10-CM

## 2024-10-05 DIAGNOSIS — Z7984 Long term (current) use of oral hypoglycemic drugs: Secondary | ICD-10-CM | POA: Diagnosis not present

## 2024-10-05 DIAGNOSIS — H1013 Acute atopic conjunctivitis, bilateral: Secondary | ICD-10-CM | POA: Insufficient documentation

## 2024-10-05 DIAGNOSIS — J309 Allergic rhinitis, unspecified: Secondary | ICD-10-CM

## 2024-10-05 LAB — COMPREHENSIVE METABOLIC PANEL WITH GFR
ALT: 20 U/L (ref 3–35)
AST: 15 U/L (ref 5–37)
Albumin: 4.6 g/dL (ref 3.5–5.2)
Alkaline Phosphatase: 62 U/L (ref 39–117)
BUN: 14 mg/dL (ref 6–23)
CO2: 29 meq/L (ref 19–32)
Calcium: 9.9 mg/dL (ref 8.4–10.5)
Chloride: 98 meq/L (ref 96–112)
Creatinine, Ser: 0.49 mg/dL (ref 0.40–1.20)
GFR: 98.34 mL/min
Glucose, Bld: 152 mg/dL — ABNORMAL HIGH (ref 70–99)
Potassium: 3.8 meq/L (ref 3.5–5.1)
Sodium: 139 meq/L (ref 135–145)
Total Bilirubin: 0.5 mg/dL (ref 0.2–1.2)
Total Protein: 7.4 g/dL (ref 6.0–8.3)

## 2024-10-05 LAB — MICROALBUMIN / CREATININE URINE RATIO
Creatinine,U: 26.5 mg/dL
Microalb Creat Ratio: UNDETERMINED mg/g (ref 0.0–30.0)
Microalb, Ur: <0.7 mg/dL

## 2024-10-05 LAB — HEMOGLOBIN A1C: Hgb A1c MFr Bld: 6.4 % (ref 4.6–6.5)

## 2024-10-05 MED ORDER — OLOPATADINE HCL 0.1 % OP SOLN: 1.0000 [drp] | Freq: Two times a day (BID) | OPHTHALMIC | 12 refills | Status: AC

## 2024-10-05 NOTE — Progress Notes (Unsigned)
 "  Subjective:  Patient ID: Debra [name], female    DOB: 11/04/1955  Age: 68 y.o. MRN: [id number]  CC: The primary encounter diagnosis was Diabetes type 2, controlled (HCC). A diagnosis of Allergic conjunctivitis and rhinitis, bilateral was also pertinent to this visit.   HPI Debra [name] presents for  Chief Complaint  Patient presents with   itchy, swollen eye lids    Symptoms  have  been present for the past two weeks , without discharge. Taking claritin and using azelastine eye drops once daily.  2 ) Type 2 DM:   She  feels generally well,  But is not  exercising regularly or trying to lose weight. Checking  blood sugars less than once daily at variable times, usually only if she feels she may be having a hypoglycemic event. .  BS have been under 130 fasting and < 150 post prandially.  Denies any recent hypoglyemic events.  Taking   medications as directed. Following a carbohydrate modified diet 6 days per week. Denies numbness, burning and tingling of extremities. Appetite is good.     Outpatient Medications Prior to Visit  Medication Sig Dispense Refill   albuterol  (VENTOLIN  HFA) 108 (90 Base) MCG/ACT inhaler Inhale 2 puffs into the lungs every 4 (four) hours as needed for wheezing or shortness of breath. 8 g 0   Biotin 1000 MCG CHEW Chew 1,000 mcg by mouth daily.     Cyanocobalamin  (VITAMIN B-12 PO) Take 1 tablet by mouth daily.     EPINEPHrine  0.3 mg/0.3 mL IJ SOAJ injection Inject 0.3 mg into the muscle as needed for anaphylaxis. 1 each 2   Evolocumab  (REPATHA  SURECLICK) 140 MG/ML SOAJ INJECT 140 MG SUBCUTANEOUSLY EVERY 14 DAYS 2 mL 2   hydrochlorothiazide  (HYDRODIURIL ) 25 MG tablet TAKE 1 TABLET (25 MG TOTAL) BY MOUTH DAILY. 90 tablet 3   losartan  (COZAAR ) 50 MG tablet TAKE 1 TABLET DAILY 90 tablet 3   MAGNESIUM  CITRATE PO Take 12 mg by mouth daily.     Melatonin 10 MG TABS Take 10 mg by mouth at bedtime.     metFORMIN  (GLUCOPHAGE ) 1000 MG tablet TAKE 1 TABLET TWICE A DAY  WITH MEALS 180 tablet 3   omeprazole  (PRILOSEC) 20 MG capsule Take 20 mg by mouth daily. (Patient taking differently: Take 20 mg by mouth as needed.)     acetaminophen  (TYLENOL ) 500 MG tablet Take 2 tablets (1,000 mg total) by mouth every 6 (six) hours as needed for mild pain (pain score 1-3). (Patient not taking: Reported on 10/05/2024)     amoxicillin -clavulanate (AUGMENTIN ) 875-125 MG tablet Take 1 tablet by mouth every 12 (twelve) hours. (Patient not taking: Reported on 10/05/2024) 14 tablet 0   predniSONE  (STERAPRED UNI-PAK 21 TAB) 10 MG (21) TBPK tablet Take by mouth daily. Take 6 tabs by mouth daily  for 1 days, then 5 tabs for 1 days, then 4 tabs for 1 days, then 3 tabs for 1 days, 2 tabs for 1 days, then 1 tab by mouth daily for 1 days (Patient not taking: Reported on 10/05/2024) 21 tablet 0   No facility-administered medications prior to visit.    Review of Systems;  Patient denies headache, fevers, malaise, unintentional weight loss, skin rash, eye pain, sinus congestion and sinus pain, sore throat, dysphagia,  hemoptysis , cough, dyspnea, wheezing, chest pain, palpitations, orthopnea, edema, abdominal pain, nausea, melena, diarrhea, constipation, flank pain, dysuria, hematuria, urinary  Frequency, nocturia, numbness, tingling, seizures,  Focal weakness, Loss of  consciousness,  Tremor, insomnia, depression, anxiety, and suicidal ideation.      Objective:  BP 124/72   Pulse 93   Ht 5' 5.75 (1.67 m)   Wt 165 lb 9.6 oz (75.1 kg)   LMP  (LMP Unknown)   SpO2 96%   BMI 26.93 kg/m   BP Readings from Last 3 Encounters:  10/05/24 124/72  06/25/24 122/76  06/19/24 122/73    Wt Readings from Last 3 Encounters:  10/05/24 165 lb 9.6 oz (75.1 kg)  06/25/24 165 lb (74.8 kg)  05/20/24 170 lb 6.4 oz (77.3 kg)    Physical Exam Vitals reviewed.  Constitutional:      General: She is not in acute distress.    Appearance: Normal appearance. She is normal weight. She is not  ill-appearing, toxic-appearing or diaphoretic.  HENT:     Head: Normocephalic.  Eyes:     General: No scleral icterus.       Right eye: No discharge.        Left eye: No discharge.     Extraocular Movements: Extraocular movements intact.     Conjunctiva/sclera: Conjunctivae normal.     Pupils: Pupils are equal, round, and reactive to light.  Cardiovascular:     Rate and Rhythm: Normal rate and regular rhythm.  Musculoskeletal:        General: Normal range of motion.  Skin:    General: Skin is warm and dry.  Neurological:     General: No focal deficit present.     Mental Status: She is alert and oriented to person, place, and time. Mental status is at baseline.  Psychiatric:        Mood and Affect: Mood normal.        Behavior: Behavior normal.        Thought Content: Thought content normal.        Judgment: Judgment normal.     Lab Results  Component Value Date   HGBA1C 6.4 10/05/2024   HGBA1C 6.4 06/25/2024   HGBA1C 6.4 03/05/2024    Lab Results  Component Value Date   CREATININE 0.49 10/05/2024   CREATININE 0.58 06/25/2024   CREATININE 0.44 04/29/2024    Lab Results  Component Value Date   WBC 8.0 04/29/2024   HGB 13.9 04/29/2024   HCT 41.1 04/29/2024   PLT 351 04/29/2024   GLUCOSE 152 (H) 10/05/2024   CHOL 153 07/12/2023   TRIG 382 (H) 07/12/2023   HDL 56 07/12/2023   LDLCALC 57 07/12/2023   ALT 20 10/05/2024   AST 15 10/05/2024   NA 139 10/05/2024   K 3.8 10/05/2024   CL 98 10/05/2024   CREATININE 0.49 10/05/2024   BUN 14 10/05/2024   CO2 29 10/05/2024   TSH 2.13 03/05/2024   HGBA1C 6.4 10/05/2024   MICROALBUR <0.7 10/05/2024    No results found.  Assessment & Plan:  .Diabetes type 2, controlled (HCC) Assessment & Plan: She remains  well-controlled on metformin  .  . Patient is up-to-date on eye exams Patient  has no microalbuminuria and is tolerating ARB AND PCSK9  therapy for CAD risk reduction . Triglycerides were mildly elevatedin October  2024.  Should be repeated  at follow up with PCP in 3 months. .  Lab Results  Component Value Date   HGBA1C 6.4 10/05/2024   Lab Results  Component Value Date   LABMICR See below: 08/18/2021   LABMICR See below: 02/16/2021   MICROALBUR <0.7 10/05/2024   MICROALBUR 0.3 11/02/2022  Lab Results  Component Value Date   CHOL 153 07/12/2023   HDL 56 07/12/2023   LDLCALC 57 07/12/2023   TRIG 382 (H) 07/12/2023   CHOLHDL 2.7 07/12/2023   Lab Results  Component Value Date   ALT 20 10/05/2024   AST 15 10/05/2024   ALKPHOS 62 10/05/2024   BILITOT 0.5 10/05/2024     Orders: -     Comprehensive metabolic panel with GFR -     Microalbumin / creatinine urine ratio -     Hemoglobin A1c  Allergic conjunctivitis and rhinitis, bilateral Assessment & Plan: Increase azelastine to bid and claritin to bid.  Adding eye moisturizing drops every 2 hours    Other orders -     Olopatadine  HCl; Place 1 drop into both eyes 2 (two) times daily.  Dispense: 5 mL; Refill: 12     I spent 34 minutes on the day of this face to face encounter reviewing patient's  most recent visit with cardiology,  nephrology,  and neurology,  prior relevant surgical and non surgical procedures, recent  labs and imaging studies, counseling on weight management,  reviewing the assessment and plan with patient, and post visit ordering and reviewing of  diagnostics and therapeutics with patient  .   Follow-up: No follow-ups on file.   Verneita LITTIE Kettering, MD "

## 2024-10-05 NOTE — Patient Instructions (Addendum)
 Treatment for allergic conjunctivitis includes topical and oral anthistamines , like you are already doing,  but in higher  doses  Increase the azelastine eye drops  to twice daily and increase the claritin  to twice daily   Add refresh or systane eye drops every 2 hours to moisten and flush eyes   If no improvement, in 48 hours,  you an the the alternative eye drop called Patanol eye drops  If bacterial infection occurs (eyes are very red  and discharge becomes milky) call for topical antibiotic

## 2024-10-05 NOTE — Assessment & Plan Note (Signed)
 Increase azelastine to bid and claritin to bid.  Adding eye moisturizing drops every 2 hours

## 2024-10-06 ENCOUNTER — Ambulatory Visit: Payer: Self-pay | Admitting: Internal Medicine

## 2024-10-06 NOTE — Assessment & Plan Note (Addendum)
 She remains  well-controlled on metformin  .  . Patient is up-to-date on eye exams Patient  has no microalbuminuria and is tolerating ARB AND PCSK9  therapy for CAD risk reduction . Triglycerides were mildly elevatedin October 2024.  Should be repeated  at follow up with PCP in 3 months. .  Lab Results  Component Value Date   HGBA1C 6.4 10/05/2024   Lab Results  Component Value Date   LABMICR See below: 08/18/2021   LABMICR See below: 02/16/2021   MICROALBUR <0.7 10/05/2024   MICROALBUR 0.3 11/02/2022     Lab Results  Component Value Date   CHOL 153 07/12/2023   HDL 56 07/12/2023   LDLCALC 57 07/12/2023   TRIG 382 (H) 07/12/2023   CHOLHDL 2.7 07/12/2023   Lab Results  Component Value Date   ALT 20 10/05/2024   AST 15 10/05/2024   ALKPHOS 62 10/05/2024   BILITOT 0.5 10/05/2024

## 2024-10-16 ENCOUNTER — Other Ambulatory Visit: Payer: Self-pay | Admitting: Internal Medicine

## 2024-10-16 ENCOUNTER — Ambulatory Visit: Payer: Self-pay

## 2024-10-16 MED ORDER — PREDNISONE 10 MG PO TABS: ORAL_TABLET | ORAL | 0 refills | Status: AC

## 2024-10-16 NOTE — Telephone Encounter (Signed)
 Called and informed pt.

## 2024-10-16 NOTE — Telephone Encounter (Signed)
 FYI Only or Action Required?: Action required by provider: clinical question for provider and update on patient condition.  Patient was last seen in primary care on 10/05/2024 by Marylynn Verneita CROME, MD.  Called Nurse Triage reporting Facial Swelling.  Symptoms began several weeks ago.  Interventions attempted: OTC medications: claritin; eyedrops.  Symptoms are: unchanged.  Triage Disposition: See PCP Within 2 Weeks  Patient/caregiver understands and will follow disposition?: Yes    Copied from CRM [id number]. Topic: Clinical - Red Word Triage >> Oct 16, 2024  9:32 AM Pinkey ORN wrote: Red Word that prompted transfer to Nurse Triage: Worsening Swelling In Eyelids    Reason for Disposition  Eyelid swelling is a chronic problem (recurrent or ongoing AND present > 4 weeks)  Answer Assessment - Initial Assessment Questions Pt called to f/u with PCP or Dr. Tullo for continued swelling of her upper eyelids; pt seen 10/05/24 for allergic conjunctivitis x 3 weeks. Pt has been doing eyedrops and claritin daily without relief. Pt states swelling isn't getting worse but isn't getting better. She denies any discharge, no fever, no changes in vision. States that her upper eyelids stay puffy. Pt states that she discussed pred taper with Dr. Marylynn if drops and claritin did not aid in symptom relief and would like to try that at this time. Pt did report she is moving in 2 weeks and wanted provider to be aware. Pharmacy confirmed with pt.     1. ONSET: When did the swelling start? (e.g., minutes, hours, days)     Ongoing; 3 weeks at this time; seen 10/05/24 by Dr. Tullo   2. LOCATION: What part of the eyelid is swollen?     Upper eyelid   3. SEVERITY: How swollen is it? (e.g., describe; mild, moderate, or severe)     Mild; puffiness   4. ITCHING: Is there any itching? If Yes, ask: How much?   (Scale 1-10; mild, moderate or severe)     Yes; mild   5. PAIN: Is the swelling painful to  touch? If Yes, ask: How painful is it?   (Scale 1-10; mild, moderate or severe)     No   6. FEVER: Do you have a fever? If Yes, ask: What is it, how was it measured, and when did it start?      No   7. CAUSE: What do you think is causing the swelling?     Allergic conjunctivis per visit   8. RECURRENT SYMPTOM: Have you had eyelid swelling before? If Yes, ask: When was the last time? What happened that time?     Yes; pt has been treating for 3 weeks. Using claritin and eyedrops   9. OTHER SYMPTOMS: Do you have any other symptoms? (e.g., blurred vision, eye discharge, rash, runny nose)     no  Protocols used: Eyelid Swelling-A-AH

## 2024-10-22 ENCOUNTER — Telehealth: Payer: Self-pay

## 2024-10-22 NOTE — Telephone Encounter (Signed)
 Detailed vm left informing pt that she will need to sign a records release form from the new office (legally unable to send records with out that forms) she is going to and have them fax it to us  and our medical records department will handle releasing/ faxing her records to their office. This is not a task our office handles directly.

## 2024-10-22 NOTE — Telephone Encounter (Signed)
 Copied from CRM #[id number]. Topic: Medical Record Request - Records Request >> Oct 22, 2024  8:11 AM Revonda D wrote: Reason for CRM: Pt stated that she is moving and will be seeing another provider in September. Pt stated that she needs her medical records faxed over to the new office and would like a callback with an update on this request. Pt doesn't have the fax number but provided the phone number, [phone]. Providers name is Dr.Li Zelphia, address is [address], [place], [place] [address].

## 2024-11-04 ENCOUNTER — Telehealth: Payer: Self-pay | Admitting: Pharmacy Technician

## 2024-11-04 NOTE — Telephone Encounter (Signed)
 Pt no longer a pt

## 2024-11-04 NOTE — Telephone Encounter (Signed)
 Pharmacy Patient Advocate Encounter  Received notification from [facility] [employer] that Prior Authorization for Repatha  sureclick (TIER EXCEPTION) has been DENIED.  Full denial letter will be uploaded to the media tab. See denial reason below.

## 2024-11-10 ENCOUNTER — Telehealth: Payer: Self-pay

## 2024-11-10 NOTE — Telephone Encounter (Signed)
 Copied from CRM [id number]. Topic: Clinical - Prescription Issue >> Nov 09, 2024  8:34 AM Carlyon D wrote: Reason for CRM: Pt is calling in regards to her cholesterol injection she would like the nurse to give her a call back in regards to this.  Please reach back out to pt for further information.

## 2024-11-13 ENCOUNTER — Other Ambulatory Visit: Payer: Self-pay | Admitting: Internal Medicine

## 2024-11-13 MED ORDER — EZETIMIBE 10 MG PO TABS: 10.0000 mg | ORAL_TABLET | Freq: Every day | ORAL | 1 refills | Status: AC

## 2024-11-13 NOTE — Telephone Encounter (Signed)
 Spoke with pt and she stated that she was on Zetia  before being put on Repatha . She stated that the Zetia  did not work so that why they put her on Repatha . I will send this to the PA team to see if we can get this approved for pt.

## 2024-11-13 NOTE — Telephone Encounter (Unsigned)
 Copied from CRM [id number]. Topic: Clinical - Medication Prior Auth >> Nov 13, 2024  9:31 AM Revonda D wrote: Reason for CRM: Pt stated that her insurance is no longer covering the Evolocumab  (REPATHA  SURECLICK) 140 MG/ML SOAJ and denied the request due to the medication being a Tier 3 medication. Pt wants to know if she can be prescribed a Tier 2 medication or any other medication that the provider recommends ( unable to take statins). While researching information in regards to this concern, I seen that the pt is no longer a pt at this office. Pt was recently seen on 10/05/24 but stated that she moved and is changing providers. Pt doesn't see a new PCP until September and stated that she is still staying in the state ([place]) and hasn't moved out of state. Pt stated that she was informed that she would still be able to get medications and still be a patient until after her visit with the new PCP. Pt would like a callback with an update on this concern.
